# Patient Record
Sex: Female | Born: 1935
Health system: Southern US, Community
[De-identification: ages and names within clinical notes are randomized; demographics above are authoritative.]

## PROBLEM LIST (undated history)

## (undated) DIAGNOSIS — N182 Chronic kidney disease, stage 2 (mild): Secondary | ICD-10-CM

## (undated) DIAGNOSIS — I8390 Asymptomatic varicose veins of unspecified lower extremity: Secondary | ICD-10-CM

## (undated) DIAGNOSIS — H9190 Unspecified hearing loss, unspecified ear: Secondary | ICD-10-CM

## (undated) DIAGNOSIS — I10 Essential (primary) hypertension: Secondary | ICD-10-CM

## (undated) DIAGNOSIS — M419 Scoliosis, unspecified: Secondary | ICD-10-CM

## (undated) DIAGNOSIS — D509 Iron deficiency anemia, unspecified: Secondary | ICD-10-CM

## (undated) DIAGNOSIS — E7800 Pure hypercholesterolemia, unspecified: Secondary | ICD-10-CM

## (undated) HISTORY — DX: Iron deficiency anemia, unspecified: D50.9

## (undated) HISTORY — PX: TONSILLECTOMY: SUR1361

## (undated) HISTORY — DX: Scoliosis, unspecified: M41.9

## (undated) HISTORY — PX: VARICOSE VEIN SURGERY: SHX832

## (undated) HISTORY — DX: Chronic kidney disease, stage 2 (mild): N18.2

## (undated) HISTORY — PX: STAPEDECTOMY: SHX2435

---

## 1997-03-26 ENCOUNTER — Other Ambulatory Visit: Admission: RE | Admit: 1997-03-26 | Discharge: 1997-03-26 | Payer: Self-pay | Admitting: Cardiology

## 1998-01-24 ENCOUNTER — Ambulatory Visit (HOSPITAL_COMMUNITY): Admission: RE | Admit: 1998-01-24 | Discharge: 1998-01-24 | Payer: Self-pay | Admitting: Cardiology

## 1998-11-08 ENCOUNTER — Other Ambulatory Visit: Admission: RE | Admit: 1998-11-08 | Discharge: 1998-11-08 | Payer: Self-pay | Admitting: Cardiology

## 1999-01-29 ENCOUNTER — Ambulatory Visit (HOSPITAL_COMMUNITY): Admission: RE | Admit: 1999-01-29 | Discharge: 1999-01-29 | Payer: Self-pay | Admitting: Cardiology

## 1999-01-29 ENCOUNTER — Encounter: Payer: Self-pay | Admitting: Cardiology

## 1999-11-28 ENCOUNTER — Other Ambulatory Visit: Admission: RE | Admit: 1999-11-28 | Discharge: 1999-11-28 | Payer: Self-pay | Admitting: Internal Medicine

## 2000-01-24 ENCOUNTER — Encounter: Payer: Self-pay | Admitting: Emergency Medicine

## 2000-01-24 ENCOUNTER — Emergency Department (HOSPITAL_COMMUNITY): Admission: EM | Admit: 2000-01-24 | Discharge: 2000-01-24 | Payer: Self-pay | Admitting: Emergency Medicine

## 2000-02-05 ENCOUNTER — Ambulatory Visit (HOSPITAL_COMMUNITY): Admission: RE | Admit: 2000-02-05 | Discharge: 2000-02-05 | Payer: Self-pay | Admitting: Internal Medicine

## 2000-02-05 ENCOUNTER — Encounter: Payer: Self-pay | Admitting: Internal Medicine

## 2000-11-10 ENCOUNTER — Ambulatory Visit (HOSPITAL_COMMUNITY): Admission: RE | Admit: 2000-11-10 | Discharge: 2000-11-10 | Payer: Self-pay | Admitting: Gastroenterology

## 2001-02-17 ENCOUNTER — Encounter: Payer: Self-pay | Admitting: Internal Medicine

## 2001-02-17 ENCOUNTER — Ambulatory Visit (HOSPITAL_COMMUNITY): Admission: RE | Admit: 2001-02-17 | Discharge: 2001-02-17 | Payer: Self-pay | Admitting: Internal Medicine

## 2002-04-13 ENCOUNTER — Encounter: Payer: Self-pay | Admitting: Internal Medicine

## 2002-04-13 ENCOUNTER — Ambulatory Visit (HOSPITAL_COMMUNITY): Admission: RE | Admit: 2002-04-13 | Discharge: 2002-04-13 | Payer: Self-pay | Admitting: Internal Medicine

## 2003-05-03 ENCOUNTER — Ambulatory Visit (HOSPITAL_COMMUNITY): Admission: RE | Admit: 2003-05-03 | Discharge: 2003-05-03 | Payer: Self-pay | Admitting: Internal Medicine

## 2003-08-10 ENCOUNTER — Encounter: Admission: RE | Admit: 2003-08-10 | Discharge: 2003-08-10 | Payer: Self-pay | Admitting: Otolaryngology

## 2005-01-20 ENCOUNTER — Encounter: Admission: RE | Admit: 2005-01-20 | Discharge: 2005-01-20 | Payer: Self-pay | Admitting: Internal Medicine

## 2007-03-30 ENCOUNTER — Encounter: Admission: RE | Admit: 2007-03-30 | Discharge: 2007-03-30 | Payer: Self-pay | Admitting: Internal Medicine

## 2007-04-12 ENCOUNTER — Encounter: Admission: RE | Admit: 2007-04-12 | Discharge: 2007-04-12 | Payer: Self-pay | Admitting: Interventional Radiology

## 2007-04-20 ENCOUNTER — Encounter: Admission: RE | Admit: 2007-04-20 | Discharge: 2007-04-20 | Payer: Self-pay | Admitting: Interventional Radiology

## 2007-05-24 ENCOUNTER — Encounter: Admission: RE | Admit: 2007-05-24 | Discharge: 2007-05-24 | Payer: Self-pay | Admitting: Interventional Radiology

## 2007-11-15 ENCOUNTER — Encounter: Admission: RE | Admit: 2007-11-15 | Discharge: 2007-11-15 | Payer: Self-pay | Admitting: Interventional Radiology

## 2008-10-06 ENCOUNTER — Ambulatory Visit: Payer: Self-pay | Admitting: Diagnostic Radiology

## 2008-10-06 ENCOUNTER — Emergency Department (HOSPITAL_BASED_OUTPATIENT_CLINIC_OR_DEPARTMENT_OTHER): Admission: EM | Admit: 2008-10-06 | Discharge: 2008-10-06 | Payer: Self-pay | Admitting: Emergency Medicine

## 2010-06-06 NOTE — Procedures (Signed)
[place]  Patient:    Donna Callahan, Donna Callahan Visit Number: [id number] MRN: [id number]          Service Type: END Location: ENDO Attending Physician:  Rich Brave Dictated by:   Florencia Reasons, M.D. Proc. Date: 11/09/00 Admit Date:  11/10/2000   CC:         Rodrigo Ran, M.D.   Procedure Report  PROCEDURE:  Colonoscopy.  INDICATION:  This is a 77 year old female, asymptomatic, for colon cancer screening.  FINDINGS:  Normal exam to the terminal ileum except for moderate sigmoid diverticulosis.  The nature, purpose, and risks of the procedure had been discussed with the patient, who provided written consent.  Sedation was 50 mcg and Versed 5 mg IV without arrhythmias or desaturation.  The Olympus adjustable-tension pediatric video colonoscope was advanced to the terminal ileum with just some mild looping overcome by external abdominal compression.  Pullback was then performed.  The quality of the prep was excellent, and it is felt that all areas were seen well.  There was extensive sigmoid diverticulosis, but otherwise this was a normal exam without evidence of polyps, cancer, colitis, or vascular malformations. Retroflexion in the rectum was unremarkable, and pull-out through the anal canal demonstrated just mild internal hemorrhoids.  No biopsies were obtained. The patient tolerated the procedure well, and there were no apparent complications.  IMPRESSION:  Normal screening colonoscopy except for sigmoid diverticulosis.  PLAN:  Consideration for flexible sigmoidoscopy in five years for continued colon cancer screening. Dictated by:   Florencia Reasons, M.D. Attending Physician:  Rich Brave DD:  11/10/00 TD:  11/11/00 Job: [id number]

## 2010-11-25 ENCOUNTER — Other Ambulatory Visit: Payer: Self-pay | Admitting: *Deleted

## 2011-05-02 ENCOUNTER — Emergency Department (INDEPENDENT_AMBULATORY_CARE_PROVIDER_SITE_OTHER): Payer: Medicare Other

## 2011-05-02 ENCOUNTER — Encounter (HOSPITAL_COMMUNITY): Payer: Self-pay | Admitting: Emergency Medicine

## 2011-05-02 ENCOUNTER — Emergency Department (INDEPENDENT_AMBULATORY_CARE_PROVIDER_SITE_OTHER)
Admission: EM | Admit: 2011-05-02 | Discharge: 2011-05-02 | Disposition: A | Discharge status: Home / Self Care | Payer: Medicare Other | Source: Home / Self Care | Attending: Emergency Medicine | Admitting: Emergency Medicine

## 2011-05-02 DIAGNOSIS — M1120 Other chondrocalcinosis, unspecified site: Secondary | ICD-10-CM

## 2011-05-02 HISTORY — DX: Pure hypercholesterolemia, unspecified: E78.00

## 2011-05-02 HISTORY — DX: Essential (primary) hypertension: I10

## 2011-05-02 HISTORY — DX: Unspecified hearing loss, unspecified ear: H91.90

## 2011-05-02 MED ORDER — PREDNISONE 5 MG PO KIT: 1.0000 | PACK | Freq: Every day | ORAL | Status: DC

## 2011-05-02 NOTE — ED Notes (Signed)
Given warm blankets 

## 2011-05-02 NOTE — Discharge Instructions (Signed)
Pseudogout  Pseudogout is similar to gout. It is an arthritis that causes pain, swelling, and inflammation in a joint. This is due to the presence of calcium pyrophosphate crystals in the joint fluid. This is a different type of crystal than the crystals that cause gout. The joint pain can be severe and may last for days. In some cases, it may last much longer and can mimic rheumatoid arthritis or osteoarthritis.  CAUSES   The exact cause of the disease is not known. It develops when crystals of calcium pyrophosphate build up in a joint. These crystals cause inflammation that leads to pain and swelling of the joint.   Chances of developing pseudogout increase with age. It often follows a minor injury.   The condition may be passed down from parent to child (hereditary).   Events such as strokes, heart attacks, or surgery may increase the risk of pseudogout.   Pseudogout can be associated with other disorders (hemophilia, ochronosis, amyloidosis, or hormonal disorders).   Pseudogout can be associated with dehydration, especially following surgery or hospitalization. Patients with known pseudogout should stay well hydrated before and after surgery.  SYMPTOMS    Intense, constant pain in one joint that seems to come on for no reason.   The joint area may be hot to the touch, red, swollen, and stiff.   Pain may last from several days to a few weeks. It may then disappear. Later, it may start again, possibly in a different joint.   Pseudogout usually affects the knees. It can also affect the wrists, elbows, shoulders, and ankles.  DIAGNOSIS   The diagnosis is often suggested by your exam or is suspected when an abnormal buildup of calcium salts (calcifications) are seen in the cartilage of joints on X-rays. The final diagnosis is made when fluid from the joint is examined under a special microscope used to find calcium pyrophosphate crystals. The crystals of pseudogout and gout may both be present at the same  time.  TREATMENT   Nonsteroidal anti-inflammatory drugs (NSAIDs), such as naproxen, treat the pain. Identifying the trigger of pseudogout and treating the underlying cause, such as dehydration, is also important. There is no way to remove the crystals themselves.  HOME CARE INSTRUCTIONS    Put ice on the sore joint.   Put ice in a plastic bag.   Place a towel between your skin and the bag.   Leave the ice on for 15 to 20 minutes at a time, 3 to 4 times a day, for the first 2 days.   Keep your affected joints raised (elevated) when possible to lessen swelling.   Use crutches (non-weight bearing) as needed. Walk without crutches as the pain allows, or as instructed. Gradually, start bearing weight.   Only take over-the-counter or prescription medicines for pain, discomfort, or fever as directed by your caregiver.   Once you recover from the painful attack, exercise regularly to keep your muscle strength. Not using a sore joint will cause the muscles around it to become weak. This may increase pain. Low-impact exercises, such as swimming, bicycling, water aerobics, and walking, may be best. This will give you energy, strengthen your heart, help you control your weight, and improve your well-being.   Maintain a healthy weight so your joints do not need to bear more weight than necessary.  SEEK MEDICAL CARE IF:    You have an increase in joint pain not relieved with medicine.   You have a fever.   You   have more serious symptoms such as skin rash, diarrhea, vomiting, headache, or other joint pains.  FOR MORE INFORMATION   Arthritis Foundation: www.arthritis.org  National Institute of Arthritis and Musculoskeletal and Skin Diseases: www.niams.nih.gov  Document Released: 09/28/2003 Document Revised: 12/25/2010 Document Reviewed: 04/19/2009  ExitCare Patient Information 2012 [employer].

## 2011-05-02 NOTE — ED Notes (Addendum)
Patient reports injury 2 weeks ago.  Patient reports tripping over clothing rack, patient reports she was looking up at signage for a particular department.  Patient reports pain gradually worsening, patient reports she has not seen a physician prior to now. Pain in left knee with weight bearing.  Cannot recreate pain with plapation.

## 2011-05-02 NOTE — ED Provider Notes (Signed)
Chief Complaint  Patient presents with  . Knee Pain    History of Present Illness:   Donna Callahan is a 78 year old female with high blood pressure. She fell and injured her knee 2 weeks ago. Ever since then it's been aching particularly over the medial joint line and somewhat swollen. It hurts to bear weight and feels better if she gets off her feet. She denies any giving way, locking, popping, or cracking of the knee.  Review of Systems:  Other than noted above, the patient denies any of the following symptoms: Systemic:  No fevers, chills, sweats, or aches.  No fatigue or tiredness. Musculoskeletal:  No joint pain, arthritis, bursitis, swelling, back pain, or neck pain. Neurological:  No muscular weakness, paresthesias, headache, or trouble with speech or coordination.  No dizziness.   PMFSH:  Past medical history, family history, social history, meds, and allergies were reviewed.  Physical Exam:   Vital signs:  BP 169/88  Pulse 62  Temp(Src) 98.4 F (36.9 C) (Oral)  Resp 16  SpO2 98% Gen:  Alert and oriented times 3.  In no distress. Musculoskeletal: There is no obvious swelling or joint effusion. There is no pain to palpation over the knee. The knee has a full range of motion with some pain on full flexion and slight crepitus. McMurray's sign is negative. Lachman's sign is negative. Anterior drawer sign is negative. Otherwise, all joints had a full a ROM with no swelling, bruising or deformity.  No edema, pulses full. Extremities were warm and pink.  Capillary refill was brisk.  Skin:  Clear, warm and dry.  No rash. Neuro:  Alert and oriented times 3.  Muscle strength was normal.  Sensation was intact to light touch.   Radiology:  Dg Knee Complete 4 Views Left  05/02/2011  *RADIOLOGY REPORT*  Clinical Data: Larey Seat 2 weeks ago with persistent left knee pain.  LEFT KNEE - COMPLETE 4+ VIEW  Comparison: None.  Findings: The left knee is located.  Chondrocalcinosis is present in both medial and  lateral menisci.  Mild degenerative changes are noted in the medial and patellofemoral compartments.  There is no significant effusion.  No acute osseous abnormality is evident.  IMPRESSION:  1.  No acute abnormality. 2.  Minimal degenerative change. 3.  Chondrocalcinosis of the menisci.  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  Original Report Authenticated By: Jamesetta Orleans. MATTERN, M.D.    Assessment:  The encounter diagnosis was Pseudo-gout. I am reluctant to treat with nonsteroidal anti-inflammatory drugs in view of her high blood pressure, age, and medications that she is currently taking. I think the safest thing to take would be prednisone. I have suggested that she followup with her primary care doctor with regard to her blood pressure in about a week.  Plan:   1.  The following meds were prescribed:   New Prescriptions   PREDNISONE 5 MG KIT    Take 1 kit (5 mg total) by mouth daily after breakfast. Prednisone 5 mg 6 day dosepack.  Take as directed.   2.  The patient was instructed in symptomatic care, including rest and activity, elevation, application of ice and compression.  Appropriate handouts were given. 3.  The patient was told to return if becoming worse in any way, if no better in 3 or 4 days, and given some red flag symptoms that would indicate earlier return.   4.  The patient was told to follow up with her primary care doctor in one week.  Reuben Likes, MD 05/02/11 [phone]

## 2011-05-02 NOTE — ED Notes (Signed)
Provided with coffee

## 2011-10-08 ENCOUNTER — Telehealth: Payer: Self-pay

## 2011-11-23 NOTE — Telephone Encounter (Signed)
Note to close encounter only. Donna Callahan, [name]  

## 2013-06-14 ENCOUNTER — Other Ambulatory Visit: Payer: Self-pay | Admitting: Internal Medicine

## 2013-06-14 DIAGNOSIS — I83819 Varicose veins of unspecified lower extremities with pain: Secondary | ICD-10-CM

## 2013-06-21 ENCOUNTER — Ambulatory Visit
Admission: RE | Admit: 2013-06-21 | Discharge: 2013-06-21 | Disposition: A | Discharge status: Home / Self Care | Payer: Medicare Other | Source: Ambulatory Visit | Attending: Internal Medicine | Admitting: Internal Medicine

## 2013-06-21 ENCOUNTER — Encounter (INDEPENDENT_AMBULATORY_CARE_PROVIDER_SITE_OTHER): Payer: Self-pay

## 2013-06-21 DIAGNOSIS — I83819 Varicose veins of unspecified lower extremities with pain: Secondary | ICD-10-CM

## 2013-07-06 ENCOUNTER — Telehealth: Payer: Self-pay | Admitting: Radiology

## 2013-07-06 NOTE — Telephone Encounter (Signed)
Phoned patient to review insurance pre authorization for EVLT of GSV of RLE.     Patient prefers to delay procedure until late September 2015.  Will contact patient in August 2015 to schedule procedure.  Henri Carmell AustriaGales, RN 07/06/2013 1:36 PM

## 2013-09-20 ENCOUNTER — Other Ambulatory Visit (HOSPITAL_COMMUNITY): Payer: Self-pay | Admitting: Interventional Radiology

## 2013-09-20 DIAGNOSIS — I83893 Varicose veins of bilateral lower extremities with other complications: Secondary | ICD-10-CM

## 2013-10-26 ENCOUNTER — Ambulatory Visit
Admission: RE | Admit: 2013-10-26 | Discharge: 2013-10-26 | Disposition: A | Discharge status: Home / Self Care | Payer: Medicare Other | Source: Ambulatory Visit | Attending: Interventional Radiology | Admitting: Interventional Radiology

## 2013-10-26 ENCOUNTER — Other Ambulatory Visit (HOSPITAL_COMMUNITY): Payer: Self-pay | Admitting: Interventional Radiology

## 2013-10-26 VITALS — BP 165/92 | HR 83 | Temp 97.7°F | Resp 14 | Ht 64.0 in | Wt 125.0 lb

## 2013-10-26 DIAGNOSIS — I8393 Asymptomatic varicose veins of bilateral lower extremities: Secondary | ICD-10-CM

## 2013-10-26 HISTORY — DX: Asymptomatic varicose veins of unspecified lower extremity: I83.90

## 2013-10-26 NOTE — Progress Notes (Signed)
0945  Informed consent obtained.  Patient given verbal and written discharge instructions.  States that she understands.  0950  22 gauge x 1" insyte catheter started IV Left antecubital (x 1 attempt) with NSL & 7" extension.  Flushed with 10 mL NSS without difficulty.  Stie unremarkable.  1015  Procedure started.    1120  Procedure completed.  Patient tolerated well.  1125  IV discontinued, catheter intact. Site unremarkable.    1130 Wearing thigh high graduated compression garment on RLE  (20-30 mm Hg).  Ambulated x 10 minutes without assistance.  Gait steady.  1140  Reviewed discharge instructions.  Patient's daughter here to provide transportation to home.    1145  Discharged to home.   [place] Delauder Carmell AustriaGales, RN 10/26/2013 11:39 AM

## 2013-11-01 ENCOUNTER — Ambulatory Visit
Admission: RE | Admit: 2013-11-01 | Discharge: 2013-11-01 | Disposition: A | Discharge status: Home / Self Care | Payer: Medicare Other | Source: Ambulatory Visit | Attending: Interventional Radiology | Admitting: Interventional Radiology

## 2013-11-01 DIAGNOSIS — I8393 Asymptomatic varicose veins of bilateral lower extremities: Secondary | ICD-10-CM

## 2013-11-01 NOTE — Consult Note (Signed)
Chief Complaint: Right lower extremity venous insufficiency.  Referring Physician(s): Shick,Michael  History of Present Illness: Donna Callahan is a 80 y.o. female with right lower extremity venous insufficiency. The patient underwent endovascular laser treatment to the great saphenous vein on 10/26/2013. She did very well following the procedure and she has been wearing the stockings continuously. She had mild pain at the puncture site for 2 days but that pain has resolved. She has noticed some bruising in her medial right upper thigh. The patient did fall within the last week and had some swelling in her right knee. Patient denies fevers or chills.  Past Medical History  Diagnosis Date  . Hypertension   . High cholesterol   . Hard of hearing   . Varicose veins     Past Surgical History  Procedure Laterality Date  . Tonsillectomy    . Stapedectomy      Allergies: Penicillins and Sulfa antibiotics  Medications: Prior to Admission medications   Medication Sig Start Date End Date Taking? Authorizing Provider  aspirin 81 MG tablet Take 81 mg by mouth daily.    Historical Provider, MD  buPROPion (WELLBUTRIN) 75 MG tablet Take 75 mg by mouth daily.    Historical Provider, MD  diltiazem (TIAZAC) 240 MG 24 hr capsule Take 240 mg by mouth daily.    Historical Provider, MD  LISINOPRIL PO Take by mouth.    Historical Provider, MD  loperamide (IMODIUM) 2 MG capsule Take by mouth as needed for diarrhea or loose stools.    Historical Provider, MD  potassium chloride (KLOR-CON) 20 MEQ packet Take 20 mEq by mouth 2 (two) times daily.    Historical Provider, MD  PredniSONE 5 MG KIT Take 1 kit (5 mg total) by mouth daily after breakfast. Prednisone 5 mg 6 day dosepack.  Take as directed. 05/02/11   Harden Mo, MD  psyllium (METAMUCIL) 58.6 % powder Take 1 packet by mouth daily.    Historical Provider, MD  SIMVASTATIN PO Take by mouth.    Historical Provider, MD  TRIAMTERENE-HCTZ PO Take by  mouth.    Historical Provider, MD    No family history on file.  History   Social History  . Marital Status: Married    Spouse Name: [name]    Number of Children: [name]  . Years of Education: [name]   Social History Main Topics  . Smoking status: Never Smoker   . Smokeless tobacco: Not on file  . Alcohol Use: No  . Drug Use: No  . Sexual Activity: Not on file   Other Topics Concern  . Not on file   Social History Narrative  . No narrative on file     Review of Systems  Constitutional: Negative.     Vital Signs: There were no vitals taken for this visit.  Physical Exam Right lower extremity: There is bruising in the medial right upper thigh. There is obvious swelling of the right knee. The puncture site above the right ankle is well-healed without bruising or surrounding redness. There are visible varicosities along the anterior and lateral aspect of the lower right calf.  Imaging: US Venous Img Lower Unilateral Right  11/01/2013   CLINICAL DATA:  80 year old with right lower extremity venous insufficiency. The patient underwent endovascular laser treatment to the right great saphenous vein on 10/26/2013. Patient presents for 1 week follow-up.  EXAM: RIGHT LOWER EXTREMITY VENOUS DOPPLER ULTRASOUND  TECHNIQUE: Gray-scale sonography with graded compression, as well as color Doppler and  duplex ultrasound, were performed to evaluate the deep venous system from the level of the common femoral vein through the popliteal and proximal calf veins. Spectral Doppler was utilized to evaluate flow at rest and with distal augmentation maneuvers.  COMPARISON:  None.  FINDINGS: Superficial venous system: The treated right great saphenous vein is occluded from the lower calf to the right groin. Varicosities in the anterior upper thigh are also thrombosed. There are patent varicosities along the anterior knee and along the anterior and lateral lower calf. The great saphenous vein is occluded to the  saphenofemoral junction.  Deep venous system: Normal compressibility, augmentation and color Doppler flow in the right common femoral vein, right femoral vein and right popliteal vein. Visualized deep calf veins are patent.  Other: There is fluid around the patient's right knee and the right knee is swollen on physical examination. Reportedly, the patient had a recent fall and injury to the right knee.  IMPRESSION: The treated right great saphenous vein is occluded.  The right lower extremity deep venous system is patent. No evidence for deep vein thrombosis.  Right knee effusion. Patient did have a large knee effusion on a MRI from 05/07/2013 and unclear if this fluid is acute or chronic.   Electronically Signed   By: Markus Daft M.D.   On: 11/01/2013 10:20   Ir Embo Venous Not [place]  10/26/2013   CLINICAL DATA:  Known bilateral GSV venous insufficiency, lower extremity varicose veins. Progressive right lower extremity venous insufficiency with enlarging right thigh, knee and calf varicosities, edema, leg pain and fatigue.  EXAM: ULTRASOUND GUIDED RIGHT GSV TRANSCATHETER LASER OCCLUSION  Date:  10/8/201510/08/2013 11:49 am  Radiologist:  M. Daryll Brod, MD  Guidance:  Ultrasound  FLUOROSCOPY TIME:  None.  MEDICATIONS AND MEDICAL HISTORY: 300 cc 1% diluted Lidocaine for tumescent anesthesia  ANESTHESIA/SEDATION: None.  CONTRAST:  None.  COMPLICATIONS: None immediate  PROCEDURE: Informed consent was obtained from the patient following explanation of the procedure, risks, benefits and alternatives. The patient understands, agrees and consents for the procedure. All questions were addressed. A time out was performed.  Maximal barrier sterile technique utilized including caps, mask, sterile gowns, sterile gloves, large sterile drape, hand hygiene, and Betadine.  The preliminary venous mapping performed of the right GSV from the saphenous femoral junction to the ankle. Overlying skin was  marked. Right leg was sterilely prepped and draped. Under sterile conditions and local anesthesia, ultrasound micropuncture access performed of the inferior right GSV above the ankle. Guidewire advanced easily followed by the 4 [place] dilator. 035 guidewire inserted and advanced across the left SFJ without difficulty. 4 French delivery sheath advanced and position 20 mm below the left SFJ. Position confirmed with ultrasound. Images obtained for documentation. Laser fiber connected to the energy source and advanced through the sheath but not exposed to the vein wall. Local and tumescent anesthesia applied with the tumescent pump delivery set utilizing 300 cc diluted 1% lidocaine. Survey of the right GSV segment to be treated confirmed adequate insulation, collapse and tumescence around the vein. Vein depth is greater than 10 mm.  Laser fiber was enable at power setting of 8 W. Laser fiber position recovered to be 20 mm inferior to the right SFJ. The treated segment was 60 cm in length. 365 seconds of laser time was utilized to treat the right GSV with a total laser energy of 2922 Joules.  Hemostasis obtained with compression. Patient tolerated the procedure well. No  immediate complication. She was ambulated in our [place] in a 20/30 mm Hg compression stockings and stable for discharge after 30 min.  IMPRESSION: Successful ultrasound right GSV transcatheter laser occlusion.  PLAN: Follow-up will be performed at 1 week, 1 month and 6 months. She will likely need additional right lower extremity varicose vein sclerotherapy versus vein removal.   Electronically Signed   By: Daryll Brod M.D.   On: 10/26/2013 12:07    Assessment and Plan:  80 year old with right lower extremity venous insufficiency and underwent endovascular laser treatment to the right great saphenous vein. The treated right great saphenous vein is occluded and no evidence for right lower extremity DVT. There are residual patent varicosities  in the anterior knee and right calf. These will likely require ultrasound-guided sclerotherapy.  Patient tolerated the procedure very well and continues to wear her stockings. I advised the patient to continue to wear the stocking until her next visit with Dr. Annamaria Boots in 3-4 weeks.  Right knee effusion. Patient had a knee effusion on a previous MRI study and unclear if this represents acute or chronic fluid. Patient is scheduled to see her primary care physician.     I spent a total of 10 minutes face to face in clinical consultation, greater than 50% of which was counseling/coordinating care for the right lower extremity venous insufficiency.  Signed: Carylon Perches 11/01/2013, 12:10 PM

## 2013-11-30 ENCOUNTER — Ambulatory Visit
Admission: RE | Admit: 2013-11-30 | Discharge: 2013-11-30 | Disposition: A | Discharge status: Home / Self Care | Payer: Medicare Other | Source: Ambulatory Visit | Attending: Interventional Radiology | Admitting: Interventional Radiology

## 2013-11-30 DIAGNOSIS — I8393 Asymptomatic varicose veins of bilateral lower extremities: Secondary | ICD-10-CM

## 2013-11-30 NOTE — Progress Notes (Signed)
Patient ID: Donna Callahan, female   DOB: 26-Nov-1930, 80 y.o.   MRN: [id number]   Chief Complaint: One month status post right GSV transcatheter laser occlusion for venous insufficiency, varicose veins or leg pain.  Referring Physician(s): Perini  History of Present Illness: Donna Callahan is a 80 y.o. female who is now one month status post right GSP transcatheter laser occlusion for venous visit, varicose veins and leg pain. Over the last month she has recovered very well. She reports decrease in size of the right lower surety varicosities across the knee. Right leg is now asymptomatic. No current right leg pain, fatigue, or heaviness. She has been compliant with the 20/30 mm mercury compression stocking and daily walking. Overall she is doing very well. No interval fevers. No significant extremity edema or thrombophlebitis. She has had a recent fall with chronic right knee pain and effusion.  Past Medical History  Diagnosis Date  . Hypertension   . High cholesterol   . Hard of hearing   . Varicose veins     Past Surgical History  Procedure Laterality Date  . Tonsillectomy    . Stapedectomy      Allergies: Penicillins and Sulfa antibiotics  Medications: Prior to Admission medications   Medication Sig Start Date End Date Taking? Authorizing Provider  aspirin 81 MG tablet Take 81 mg by mouth daily.    Historical Provider, MD  buPROPion (WELLBUTRIN) 75 MG tablet Take 75 mg by mouth daily.    Historical Provider, MD  diltiazem (TIAZAC) 240 MG 24 hr capsule Take 240 mg by mouth daily.    Historical Provider, MD  LISINOPRIL PO Take by mouth.    Historical Provider, MD  loperamide (IMODIUM) 2 MG capsule Take by mouth as needed for diarrhea or loose stools.    Historical Provider, MD  potassium chloride (KLOR-CON) 20 MEQ packet Take 20 mEq by mouth 2 (two) times daily.    Historical Provider, MD  PredniSONE 5 MG KIT Take 1 kit (5 mg total) by mouth daily after breakfast. Prednisone 5 mg 6  day dosepack.  Take as directed. 05/02/11   Harden Mo, MD  psyllium (METAMUCIL) 58.6 % powder Take 1 packet by mouth daily.    Historical Provider, MD  SIMVASTATIN PO Take by mouth.    Historical Provider, MD  TRIAMTERENE-HCTZ PO Take by mouth.    Historical Provider, MD    No family history on file.  History   Social History  . Marital Status: Married    Spouse Name: [name]    Number of Children: [name]  . Years of Education: [name]   Social History Main Topics  . Smoking status: Never Smoker   . Smokeless tobacco: Not on file  . Alcohol Use: No  . Drug Use: No  . Sexual Activity: Not on file   Other Topics Concern  . Not on file   Social History Narrative  . No narrative on file    Review of Systems: A 12 point ROS discussed and pertinent positives are indicated in the HPI above.  All other systems are negative.  Review of Systems  Constitutional: Negative for fever, activity change and fatigue.  All other systems reviewed and are negative.   Vital Signs: There were no vitals taken for this visit.  Physical Exam  Constitutional: She appears well-developed and well-nourished. No distress.  Musculoskeletal: She exhibits no edema or tenderness.  Right lower extremity demonstrates decrease in size of the dominant varicosity across the knee. No  signs of active cellulitis or thrombophlebitis. No significant peripheral edema. Entry Site in the calf region is well-healed.  Skin: She is not diaphoretic.    Imaging: US Venous Img Lower Unilateral Right  11/30/2013   CLINICAL DATA:  One month status post right GSV transcatheter laser occlusion for venous insufficiency, leg pain and varicose veins  EXAM: RIGHT LOWER EXTREMITY VENOUS DOPPLER ULTRASOUND  TECHNIQUE: Gray-scale sonography with graded compression, as well as color Doppler and duplex ultrasound were performed to evaluate the lower extremity deep venous systems from the level of the common femoral vein and including the  common femoral, femoral, profunda femoral, popliteal and calf veins including the posterior tibial, peroneal and gastrocnemius veins when visible. The superficial great saphenous vein was also interrogated. Spectral Doppler was utilized to evaluate flow at rest and with distal augmentation maneuvers in the common femoral, femoral and popliteal veins.  COMPARISON:  11/01/2013  FINDINGS: The right common femoral, femoral, and popliteal veins demonstrate normal compressibility, phasicity and augmentation without DVT.  The right GSV treated segment from the saphenous femoral junction into the proximal calf is occluded. No recanalization. Right lower extremity varicosities are decompressed and smaller but remain patent.  Chronic right knee effusion again demonstrated.  IMPRESSION: Right GSV treated segment remains occluded at 1 month.  Negative for DVT  Chronic moderate right knee effusion   Electronically Signed   By: Daryll Brod M.D.   On: 11/30/2013 10:23   US Venous Img Lower Unilateral Right  11/01/2013   CLINICAL DATA:  80 year old with right lower extremity venous insufficiency. The patient underwent endovascular laser treatment to the right great saphenous vein on 10/26/2013. Patient presents for 1 week follow-up.  EXAM: RIGHT LOWER EXTREMITY VENOUS DOPPLER ULTRASOUND  TECHNIQUE: Gray-scale sonography with graded compression, as well as color Doppler and duplex ultrasound, were performed to evaluate the deep venous system from the level of the common femoral vein through the popliteal and proximal calf veins. Spectral Doppler was utilized to evaluate flow at rest and with distal augmentation maneuvers.  COMPARISON:  None.  FINDINGS: Superficial venous system: The treated right great saphenous vein is occluded from the lower calf to the right groin. Varicosities in the anterior upper thigh are also thrombosed. There are patent varicosities along the anterior knee and along the anterior and lateral lower  calf. The great saphenous vein is occluded to the saphenofemoral junction.  Deep venous system: Normal compressibility, augmentation and color Doppler flow in the right common femoral vein, right femoral vein and right popliteal vein. Visualized deep calf veins are patent.  Other: There is fluid around the patient's right knee and the right knee is swollen on physical examination. Reportedly, the patient had a recent fall and injury to the right knee.  IMPRESSION: The treated right great saphenous vein is occluded.  The right lower extremity deep venous system is patent. No evidence for deep vein thrombosis.  Right knee effusion. Patient did have a large knee effusion on a MRI from 05/07/2013 and unclear if this fluid is acute or chronic.   Electronically Signed   By: Markus Daft M.D.   On: 11/01/2013 10:20    Assessment and Plan:  One month status post right GSV transcatheter laser occlusion for venous insufficiency, varicose veins and leg pain. Treated segment is occluded at one month. Right lower extremity symptoms have resolved. No current right leg pain, fatigue, heaviness, or difficulty ambulating.  Continue daily compression stockings bilaterally, daily walking, and followup in 6 months.  I spent a total of  20 minutes face to face in clinical consultation, greater than 50% of which was counseling/coordinating care for the patient.  SignedGreggory Keen T. 11/30/2013, 10:33 AM

## 2014-03-30 ENCOUNTER — Other Ambulatory Visit (HOSPITAL_COMMUNITY): Payer: Self-pay | Admitting: Interventional Radiology

## 2014-03-30 DIAGNOSIS — I83811 Varicose veins of right lower extremities with pain: Secondary | ICD-10-CM

## 2014-04-10 ENCOUNTER — Ambulatory Visit: Payer: Self-pay | Admitting: Orthopedic Surgery

## 2014-04-10 NOTE — Progress Notes (Signed)
Preoperative surgical orders have been place into the Epic hospital system for Donna Callahan on 04/10/2014, 12:07 PM  by Patrica DuelPERKINS, Shiya Fogelman for surgery on 05-02-2014.  Preop Total Knee orders including Experal, IV Tylenol, and IV Decadron as long as there are no contraindications to the above medications. Donna Peacerew John Vasconcelos, PA-C

## 2014-04-23 ENCOUNTER — Other Ambulatory Visit (HOSPITAL_COMMUNITY): Payer: Self-pay | Admitting: *Deleted

## 2014-04-23 NOTE — Patient Instructions (Addendum)
Donna Callahan  04/23/2014   Your procedure is scheduled on: Wednesday 05/02/2014  Report to [facility] Main  Entrance and follow signs to               Short Stay Center at 1200 PM.  Call this number if you have problems the morning of surgery [phone]   Remember:  Do not eat food  :After Midnight.  MAY HAVE CLEAR LIQUIDS FROM MIDNIGHT UP UNTIL 0800 AM THEN NOTHING UNTIL AFTER SURGERY!    Take these medicines the morning of surgery with A SIP OF WATER: Diltiazem                    CLEAR LIQUID DIET  Foods Allowed                                                                     Foods Excluded  Coffee and tea, regular and decaf                             liquids that you cannot  Plain Jell-O in any flavor                                             see through such as: Fruit ices (not with fruit pulp)                                     milk, soups, orange juice  Iced Popsicles                                    All solid food Carbonated beverages, regular and diet                                    Cranberry, grape and apple juices Sports drinks like Gatorade Lightly seasoned clear broth or consume(fat free) Sugar, honey syrup   Sample Menu:  Breakfast                                Lunch                                     Supper Cranberry juice                    Beef broth                            Chicken broth Jell-O  Grape juice                           Apple juice Coffee or tea                        Jell-O                                      Popsicle                                                Coffee or tea                        Coffee or tea  _____________________________________________________________________                 Donna QuinYou may not have any metal on your body including hair pins and              piercings  Do not wear jewelry, make-up, lotions, powders or perfumes.             Do not  wear nail polish.  Do not shave  48 hours prior to surgery.              Men may shave face and neck.   Do not bring valuables to the hospital. [facility] IS NOT             RESPONSIBLE   FOR VALUABLES.  Contacts, dentures or bridgework may not be worn into surgery.  Leave suitcase in the car. After surgery it may be brought to your room.     Patients discharged the day of surgery will not be allowed to drive home.  Name and phone number of your driver:   Special Instructions: [name]              Please read over the following fact sheets you were given: _____________________________________________________________________             [facility] Before surgery, you can play an important role.  Because skin is not sterile, your skin needs to be as free of germs as possible.  You can reduce the number of germs on your skin by washing with CHG (chlorahexidine gluconate) soap before surgery.  CHG is an antiseptic cleaner which kills germs and bonds with the skin to continue killing germs even after washing. Please DO NOT use if you have an allergy to CHG or antibacterial soaps.  If your skin becomes reddened/irritated stop using the CHG and inform your nurse when you arrive at Short Stay. Do not shave (including legs and underarms) for at least 48 hours prior to the first CHG shower.  You may shave your face/neck. Please follow these instructions carefully:  1.  Shower with CHG Soap the night before surgery and the  morning of Surgery.  2.  If you choose to wash your hair, wash your hair first as usual with your  normal  shampoo.  3.  After you shampoo, rinse your hair and body thoroughly to remove the  shampoo.  4.  Use CHG as you would any other liquid soap.  You can apply chg directly  to the skin and wash                       Gently with a scrungie or clean washcloth.  5.  Apply the CHG Soap to your body ONLY FROM THE NECK DOWN.   Do not  use on face/ open                           Wound or open sores. Avoid contact with eyes, ears mouth and genitals (private parts).                       Wash face,  Genitals (private parts) with your normal soap.             6.  Wash thoroughly, paying special attention to the area where your surgery  will be performed.  7.  Thoroughly rinse your body with warm water from the neck down.  8.  DO NOT shower/wash with your normal soap after using and rinsing off  the CHG Soap.                9.  Pat yourself dry with a clean towel.            10.  Wear clean pajamas.            11.  Place clean sheets on your bed the night of your first shower and do not  sleep with pets. Day of Surgery : Do not apply any lotions/deodorants the morning of surgery.  Please wear clean clothes to the hospital/surgery center.  FAILURE TO FOLLOW THESE INSTRUCTIONS MAY RESULT IN THE CANCELLATION OF YOUR SURGERY PATIENT SIGNATURE_________________________________  NURSE SIGNATURE__________________________________  ________________________________________________________________________   [name]  An incentive spirometer is a tool that can help keep your lungs clear and active. This tool measures how well you are filling your lungs with each breath. Taking long deep breaths may help reverse or decrease the chance of developing breathing (pulmonary) problems (especially infection) following:  A long period of time when you are unable to move or be active. BEFORE THE PROCEDURE   If the spirometer includes an indicator to show your best effort, your nurse or respiratory therapist will set it to a desired goal.  If possible, sit up straight or lean slightly forward. Try not to slouch.  Hold the incentive spirometer in an upright position. INSTRUCTIONS FOR USE   Sit on the edge of your bed if possible, or sit up as far as you can in bed or on a chair.  Hold the incentive spirometer in an upright  position.  Breathe out normally.  Place the mouthpiece in your mouth and seal your lips tightly around it.  Breathe in slowly and as deeply as possible, raising the piston or the ball toward the top of the column.  Hold your breath for 3-5 seconds or for as long as possible. Allow the piston or ball to fall to the bottom of the column.  Remove the mouthpiece from your mouth and breathe out normally.  Rest for a few seconds and repeat Steps 1 through 7 at least 10 times every 1-2 hours when you are awake. Take your time and take a few normal breaths between deep breaths.  The spirometer may include an indicator to  show your best effort. Use the indicator as a goal to work toward during each repetition.  After each set of 10 deep breaths, practice coughing to be sure your lungs are clear. If you have an incision (the cut made at the time of surgery), support your incision when coughing by placing a pillow or rolled up towels firmly against it. Once you are able to get out of bed, walk around indoors and cough well. You may stop using the incentive spirometer when instructed by your caregiver.  RISKS AND COMPLICATIONS  Take your time so you do not get dizzy or light-headed.  If you are in pain, you may need to take or ask for pain medication before doing incentive spirometry. It is harder to take a deep breath if you are having pain. AFTER USE  Rest and breathe slowly and easily.  It can be helpful to keep track of a log of your progress. Your caregiver can provide you with a simple table to help with this. If you are using the spirometer at home, follow these instructions: [place] IF:   You are having difficultly using the spirometer.  You have trouble using the spirometer as often as instructed.  Your pain medication is not giving enough relief while using the spirometer.  You develop fever of 100.5 F (38.1 C) or higher. SEEK IMMEDIATE MEDICAL CARE IF:   You cough  up bloody sputum that had not been present before.  You develop fever of 102 F (38.9 C) or greater.  You develop worsening pain at or near the incision site. MAKE SURE YOU:   Understand these instructions.  Will watch your condition.  Will get help right away if you are not doing well or get worse. Document Released: 05/18/2006 Document Revised: 03/30/2011 Document Reviewed: 07/19/2006 Exit[employer] Patient Information 2014 [employer], [place].   ________________________________________________________________________  WHAT IS A BLOOD TRANSFUSION? Blood Transfusion Information  A transfusion is the replacement of blood or some of its parts. Blood is made up of multiple cells which provide different functions.  Red blood cells carry oxygen and are used for blood loss replacement.  White blood cells fight against infection.  Platelets control bleeding.  Plasma helps clot blood.  Other blood products are available for specialized needs, such as hemophilia or other clotting disorders. BEFORE THE TRANSFUSION  Who gives blood for transfusions?   Healthy volunteers who are fully evaluated to make sure their blood is safe. This is blood bank blood. Transfusion therapy is the safest it has ever been in the practice of medicine. Before blood is taken from a donor, a complete history is taken to make sure that person has no history of diseases nor engages in risky social behavior (examples are intravenous drug use or sexual activity with multiple partners). The donor's travel history is screened to minimize risk of transmitting infections, such as malaria. The donated blood is tested for signs of infectious diseases, such as HIV and hepatitis. The blood is then tested to be sure it is compatible with you in order to minimize the chance of a transfusion reaction. If you or a relative donates blood, this is often done in anticipation of surgery and is not appropriate for emergency situations. It takes  many days to process the donated blood. RISKS AND COMPLICATIONS Although transfusion therapy is very safe and saves many lives, the main dangers of transfusion include:   Getting an infectious disease.  Developing a transfusion reaction. This is an allergic reaction  to something in the blood you were given. Every precaution is taken to prevent this. The decision to have a blood transfusion has been considered carefully by your caregiver before blood is given. Blood is not given unless the benefits outweigh the risks. AFTER THE TRANSFUSION  Right after receiving a blood transfusion, you will usually feel much better and more energetic. This is especially true if your red blood cells have gotten low (anemic). The transfusion raises the level of the red blood cells which carry oxygen, and this usually causes an energy increase.  The nurse administering the transfusion will monitor you carefully for complications. HOME CARE INSTRUCTIONS  No special instructions are needed after a transfusion. You may find your energy is better. Speak with your caregiver about any limitations on activity for underlying diseases you may have. SEEK MEDICAL CARE IF:   Your condition is not improving after your transfusion.  You develop redness or irritation at the intravenous (IV) site. SEEK IMMEDIATE MEDICAL CARE IF:  Any of the following symptoms occur over the next 12 hours:  Shaking chills.  You have a temperature by mouth above 102 F (38.9 C), not controlled by medicine.  Chest, back, or muscle pain.  People around you feel you are not acting correctly or are confused.  Shortness of breath or difficulty breathing.  Dizziness and fainting.  You get a rash or develop hives.  You have a decrease in urine output.  Your urine turns a dark color or changes to pink, red, or [place]. Any of the following symptoms occur over the next 10 days:  You have a temperature by mouth above 102 F (38.9 C), not  controlled by medicine.  Shortness of breath.  Weakness after normal activity.  The white part of the eye turns yellow (jaundice).  You have a decrease in the amount of urine or are urinating less often.  Your urine turns a dark color or changes to pink, red, or [place]four. Document Released: 01/03/2000 Document Revised: 03/30/2011 Document Reviewed: 08/22/2007 [facility] Patient Information 2014 [place], [place].  _______________________________________________________________________

## 2014-04-23 NOTE — Progress Notes (Signed)
01/25/2014-PREOPERATIVE CLEARANCE FROM DR.PERINI ON CHART.

## 2014-04-24 NOTE — H&P (Signed)
TOTAL KNEE ADMISSION H&P  Patient is being admitted for right total knee arthroplasty.  Subjective:  Chief Complaint:right knee pain.  HPI: Donna Callahan, 81 y.o. female, has a history of pain and functional disability in the right knee due to arthritis and has failed non-surgical conservative treatments for greater than 12 weeks to includeNSAID's and/or analgesics, corticosteriod injections, viscosupplementation injections and activity modification.  Onset of symptoms was gradual, starting 5 years ago with gradually worsening course since that time. The patient noted no past surgery on the right knee(s).  Patient currently rates pain in the right knee(s) at 7 out of 10 with activity. Patient has night pain, worsening of pain with activity and weight bearing, pain that interferes with activities of daily living, pain with passive range of motion, crepitus and joint swelling.  Patient has evidence of subchondral sclerosis, periarticular osteophytes and joint space narrowing by imaging studies.  There is no active infection.   Past Medical History  Diagnosis Date  . Hypertension   . High cholesterol   . Hard of hearing   . Varicose veins     Past Surgical History  Procedure Laterality Date  . Tonsillectomy    . Stapedectomy       Current outpatient prescriptions:  .  acetaminophen (TYLENOL) 500 MG tablet, Take 500-1,000 mg by mouth every morning., Disp: , Rfl:  .  aspirin 81 MG tablet, Take 81 mg by mouth daily at 12 noon. , Disp: , Rfl:  .  CALCIUM-VITAMIN D PO, Take 1 tablet by mouth 3 (three) times daily., Disp: , Rfl:  .  diltiazem (TIAZAC) 240 MG 24 hr capsule, Take 240 mg by mouth every morning. , Disp: , Rfl:  .  irbesartan (AVAPRO) 150 MG tablet, Take 150 mg by mouth at bedtime., Disp: , Rfl:  .  loperamide (IMODIUM) 2 MG capsule, Take by mouth as needed for diarrhea or loose stools., Disp: , Rfl:  .  Multiple Vitamin (MULTIVITAMIN WITH MINERALS) TABS tablet, Take 1 tablet by  mouth every morning., Disp: , Rfl:  .  Omega-3 Fatty Acids (FISH OIL PO), Take 1 capsule by mouth daily at 12 noon., Disp: , Rfl:  .  Polyethyl Glycol-Propyl Glycol (SYSTANE OP), Apply 1 drop to eye every morning., Disp: , Rfl:  .  potassium chloride (KLOR-CON) 20 MEQ packet, Take 20 mEq by mouth 3 (three) times daily. , Disp: , Rfl:  .  psyllium (METAMUCIL) 58.6 % powder, Take 1 packet by mouth at bedtime. , Disp: , Rfl:  .  simvastatin (ZOCOR) 20 MG tablet, Take 20 mg by mouth at bedtime., Disp: , Rfl:  .  sodium chloride (OCEAN) 0.65 % SOLN nasal spray, Place 1-2 sprays into both nostrils every morning., Disp: , Rfl:  .  triamterene-hydrochlorothiazide (MAXZIDE-25) 37.5-25 MG per tablet, Take 1 tablet by mouth every morning., Disp: , Rfl:  .  triazolam (HALCION) 0.125 MG tablet, Take 0.062 mg by mouth at bedtime as needed for sleep., Disp: , Rfl:     Allergies  Allergen Reactions  . Penicillins Hives  . Sulfa Antibiotics Nausea And Vomiting    History  Substance Use Topics  . Smoking status: Never Smoker   . Smokeless tobacco: Not on file  . Alcohol Use: No      Review of Systems  Constitutional: Negative.   HENT: Positive for hearing loss. Negative for congestion, ear discharge, ear pain, nosebleeds, sore throat and tinnitus.   Eyes: Negative.   Respiratory: Negative.  Negative  for stridor.   Cardiovascular: Negative.   Gastrointestinal: Positive for diarrhea. Negative for heartburn, nausea, vomiting, abdominal pain, constipation, blood in stool and melena.  Genitourinary: Positive for frequency. Negative for dysuria, urgency, hematuria and flank pain.  Musculoskeletal: Positive for joint pain. Negative for myalgias, back pain, falls and neck pain.       Right knee pain  Skin: Negative.   Neurological: Negative.  Negative for headaches.  Endo/Heme/Allergies: Negative.   Psychiatric/Behavioral: Negative.     Objective:  Physical Exam  Constitutional: She is oriented to  person, place, and time. She appears well-developed and well-nourished. No distress.  HENT:  Head: Normocephalic and atraumatic.  Right Ear: External ear normal.  Left Ear: External ear normal.  Nose: Nose normal.  Mouth/Throat: Oropharynx is clear and moist.  Eyes: Conjunctivae and EOM are normal.  Neck: Normal range of motion. Neck supple.  Cardiovascular: Normal rate, regular rhythm, normal heart sounds and intact distal pulses.   No murmur heard. Respiratory: Effort normal and breath sounds normal. No respiratory distress. She has no wheezes.  GI: Soft. Bowel sounds are normal. She exhibits no distension. There is no tenderness.  Musculoskeletal:  Her hips show normal range of motion with no discomfort. Her left knee shows no effusion. Range of motion is zero to 130 with no tenderness or instability. There is no deformity. Her right knee shows slight valgus, trace effusion, trace Baker's cyst. Range is 5 to 125 with moderate crepitus on range of motion, tenderness lateral greater than medial, with no instability noted.  Neurological: She is alert and oriented to person, place, and time. She has normal strength and normal reflexes. No sensory deficit.  Skin: No rash noted. She is not diaphoretic. No erythema.  Psychiatric: She has a normal mood and affect. Her behavior is normal.    Vitals  Weight: 125 lb Height: 64in Body Surface Area: 1.6 m Body Mass Index: 21.46 kg/m  BP: 145/80 (Sitting, Left Arm, Standard) Pulse: 65 bpm  Imaging Review Plain radiographs demonstrate severe degenerative joint disease of the right knee(s). The overall alignment ismild valgus. The bone quality appears to be good for age and reported activity level.  Assessment/Plan:  End stage primary osteoarthritis, right knee   The patient history, physical examination, clinical judgment of the provider and imaging studies are consistent with end stage degenerative joint disease of the right  knee(s) and total knee arthroplasty is deemed medically necessary. The treatment options including medical management, injection therapy arthroscopy and arthroplasty were discussed at length. The risks and benefits of total knee arthroplasty were presented and reviewed. The risks due to aseptic loosening, infection, stiffness, patella tracking problems, thromboembolic complications and other imponderables were discussed. The patient acknowledged the explanation, agreed to proceed with the plan and consent was signed. Patient is being admitted for inpatient treatment for surgery, pain control, PT, OT, prophylactic antibiotics, VTE prophylaxis, progressive ambulation and ADL's and discharge planning. The patient is planning to be discharged home with home health services    TXA IV PCP: Dr. Baron HamperPerini  Vannie Hilgert, PA-C

## 2014-04-25 ENCOUNTER — Encounter (HOSPITAL_COMMUNITY)
Admission: RE | Admit: 2014-04-25 | Discharge: 2014-04-25 | Disposition: A | Discharge status: Home / Self Care | Payer: Medicare Other | Source: Ambulatory Visit | Attending: Orthopedic Surgery | Admitting: Orthopedic Surgery

## 2014-04-25 ENCOUNTER — Encounter (HOSPITAL_COMMUNITY): Payer: Self-pay

## 2014-04-25 ENCOUNTER — Ambulatory Visit
Admission: RE | Admit: 2014-04-25 | Discharge: 2014-04-25 | Disposition: A | Discharge status: Home / Self Care | Payer: Medicare Other | Source: Ambulatory Visit | Attending: Interventional Radiology | Admitting: Interventional Radiology

## 2014-04-25 DIAGNOSIS — Z01812 Encounter for preprocedural laboratory examination: Secondary | ICD-10-CM | POA: Diagnosis present

## 2014-04-25 DIAGNOSIS — Z01810 Encounter for preprocedural cardiovascular examination: Secondary | ICD-10-CM | POA: Insufficient documentation

## 2014-04-25 DIAGNOSIS — I83811 Varicose veins of right lower extremities with pain: Secondary | ICD-10-CM

## 2014-04-25 LAB — CBC
HEMATOCRIT: 41.9 % (ref 36.0–46.0)
HEMOGLOBIN: 13.6 g/dL (ref 12.0–15.0)
MCH: 29.2 pg (ref 26.0–34.0)
MCHC: 32.5 g/dL (ref 30.0–36.0)
MCV: 89.9 fL (ref 78.0–100.0)
Platelets: 274 10*3/uL (ref 150–400)
RBC: 4.66 MIL/uL (ref 3.87–5.11)
RDW: 14 % (ref 11.5–15.5)
WBC: 7.1 10*3/uL (ref 4.0–10.5)

## 2014-04-25 LAB — COMPREHENSIVE METABOLIC PANEL
ALBUMIN: 4.7 g/dL (ref 3.5–5.2)
ALT: 30 U/L (ref 0–35)
ANION GAP: 11 (ref 5–15)
AST: 29 U/L (ref 0–37)
Alkaline Phosphatase: 77 U/L (ref 39–117)
BUN: 15 mg/dL (ref 6–23)
CO2: 27 mmol/L (ref 19–32)
CREATININE: 0.71 mg/dL (ref 0.50–1.10)
Calcium: 9.5 mg/dL (ref 8.4–10.5)
Chloride: 102 mmol/L (ref 96–112)
GFR calc Af Amer: >90 mL/min (ref >90)
GFR calc non Af Amer: 79 mL/min — ABNORMAL LOW (ref >90)
Glucose, Bld: 85 mg/dL (ref 70–99)
Potassium: 4.3 mmol/L (ref 3.5–5.1)
Sodium: 140 mmol/L (ref 135–145)
TOTAL PROTEIN: 7.7 g/dL (ref 6.0–8.3)
Total Bilirubin: 0.6 mg/dL (ref 0.3–1.2)

## 2014-04-25 LAB — PROTIME-INR
INR: 0.99 (ref 0.00–1.49)
PROTHROMBIN TIME: 13.2 s (ref 11.6–15.2)

## 2014-04-25 LAB — URINALYSIS, ROUTINE W REFLEX MICROSCOPIC
Bilirubin Urine: NEGATIVE
GLUCOSE, UA: NEGATIVE mg/dL
Hgb urine dipstick: NEGATIVE
Ketones, ur: NEGATIVE mg/dL
Leukocytes, UA: NEGATIVE
NITRITE: NEGATIVE
Protein, ur: NEGATIVE mg/dL
SPECIFIC GRAVITY, URINE: 1.006 (ref 1.005–1.030)
Urobilinogen, UA: 0.2 mg/dL (ref 0.0–1.0)
pH: 6.5 (ref 5.0–8.0)

## 2014-04-25 LAB — SURGICAL PCR SCREEN
MRSA, PCR: NEGATIVE
Staphylococcus aureus: NEGATIVE

## 2014-04-25 LAB — ABO/RH: ABO/RH(D): B POS

## 2014-04-25 LAB — APTT: aPTT: 30 seconds (ref 24–37)

## 2014-04-25 NOTE — Progress Notes (Signed)
Patient ID: Donna Callahan, female   DOB: Dec 03, 1930, 81 y.o.   MRN: [id number]    Chief Complaint: 6 months status post right lower extremity GSV transcatheter laser occlusion for varicose veins and leg pain  Referring Physician(s): perini  History of Present Illness: Donna Callahan is a 81 y.o. female with a history of bilateral GSV venous insufficiency, varicose veins and leg pain. She is most recently status post right GSV transcatheter laser occlusion for venous insufficiency and enlarged right varicosities. She has recovered at home very well. Right lower leg pain has resolved. No physical limitations. She remains very active for her age. Residual right anterior knee and anterior tibial varicose veins remain. Overall she is doing very well. She is scheduled for right knee replacement next week.  Past Medical History  Diagnosis Date  . Hypertension   . High cholesterol   . Hard of hearing   . Varicose veins     Past Surgical History  Procedure Laterality Date  . Tonsillectomy    . Stapedectomy      bil ears  . Varicose vein surgery      Allergies: Penicillins and Sulfa antibiotics  Medications: Prior to Admission medications   Medication Sig Start Date End Date Taking? Authorizing Provider  acetaminophen (TYLENOL) 500 MG tablet Take 500-1,000 mg by mouth every morning.    Historical Provider, MD  aspirin 81 MG tablet Take 81 mg by mouth daily at 12 noon.     Historical Provider, MD  CALCIUM-VITAMIN D PO Take 1 tablet by mouth 3 (three) times daily.    Historical Provider, MD  diltiazem (TIAZAC) 240 MG 24 hr capsule Take 240 mg by mouth every morning.     Historical Provider, MD  irbesartan (AVAPRO) 150 MG tablet Take 150 mg by mouth at bedtime.    Historical Provider, MD  loperamide (IMODIUM) 2 MG capsule Take by mouth as needed for diarrhea or loose stools.    Historical Provider, MD  Multiple Vitamin (MULTIVITAMIN WITH MINERALS) TABS tablet Take 1 tablet by mouth every  morning.    Historical Provider, MD  Omega-3 Fatty Acids (FISH OIL PO) Take 1 capsule by mouth daily at 12 noon.    Historical Provider, MD  Polyethyl Glycol-Propyl Glycol (SYSTANE OP) Apply 1 drop to eye every morning.    Historical Provider, MD  potassium chloride (KLOR-CON) 20 MEQ packet Take 20 mEq by mouth 3 (three) times daily.     Historical Provider, MD  PredniSONE 5 MG KIT Take 1 kit (5 mg total) by mouth daily after breakfast. Prednisone 5 mg 6 day dosepack.  Take as directed. Patient not taking: Reported on 04/20/2014 05/02/11   Harden Mo, MD  psyllium (METAMUCIL) 58.6 % powder Take 1 packet by mouth at bedtime.     Historical Provider, MD  simvastatin (ZOCOR) 20 MG tablet Take 20 mg by mouth at bedtime.    Historical Provider, MD  sodium chloride (OCEAN) 0.65 % SOLN nasal spray Place 1-2 sprays into both nostrils every morning.    Historical Provider, MD  triamterene-hydrochlorothiazide (MAXZIDE-25) 37.5-25 MG per tablet Take 1 tablet by mouth every morning.    Historical Provider, MD  triazolam (HALCION) 0.125 MG tablet Take 0.062 mg by mouth at bedtime as needed for sleep.    Historical Provider, MD     No family history on file.  History   Social History  . Marital Status: Widowed    Spouse Name: [name]  . Number of  Children: [name]  . Years of Education: [name]   Social History Main Topics  . Smoking status: Never Smoker   . Smokeless tobacco: Not on file  . Alcohol Use: No  . Drug Use: No  . Sexual Activity: Not on file   Other Topics Concern  . Not on file   Social History Narrative    Review of Systems: A 12 point ROS discussed and pertinent positives are indicated in the HPI above.  All other systems are negative.  Review of Systems  Constitutional: Negative for fever, activity change, appetite change, fatigue and unexpected weight change.  Respiratory: Negative for chest tightness and shortness of breath.   Cardiovascular: Negative for chest pain.    Gastrointestinal: Negative for abdominal distention.    Vital Signs: There were no vitals taken for this visit.  Physical Exam  Constitutional: She appears well-developed and well-nourished. No distress.  Musculoskeletal: She exhibits no edema or tenderness.  Moderate-sized residual varicosities noted of the right lower extremity overlying the patella region and extending along the anterior tibial area. No significant edema. Skin intact. No ulcerations. No signs of phlebitis or cellulitis.  Skin: Skin is warm and dry. No rash noted. She is not diaphoretic. No erythema. No pallor.    Imaging: Ultrasound performed today demonstrates no evidence of right lower extremity DVT. Right GSV treated segment remains occluded but there are short segment areas of partial recanalization and thigh region. No significant recannulization. Anterior knee and tibial region varicosities remain patent and visible.  Labs:  CBC:  Recent Labs  04/25/14 1030  WBC 7.1  HGB 13.6  HCT 41.9  PLT 274    COAGS:  Recent Labs  04/25/14 1030  INR 0.99  APTT 30    BMP:  Recent Labs  04/25/14 1030  NA 140  K 4.3  CL 102  CO2 27  GLUCOSE 85  BUN 15  CALCIUM 9.5  CREATININE 0.71  GFRNONAA 79*  GFRAA >90    LIVER FUNCTION TESTS:  Recent Labs  04/25/14 1030  BILITOT 0.6  AST 29  ALT 30  ALKPHOS 77  PROT 7.7  ALBUMIN 4.7    Assessment and Plan:  6 months status post right GSV transcatheter laser occlusion. Treated segment demonstrates areas of short segment recannalization in the thigh but overall remains occluded with evidence of scarring and retraction. Leg pain and fatigue has resolved. She is very active but no physical limitations. Residual right anterior knee and tibial region varicosities noted. She would like these injected in the future. However, she is scheduled for knee replacement next week.  Recommend outpatient follow-up in 6 months once she has recovered from the knee  replacement. Consider sclerotherapy at that time.  SignedGreggory Keen 04/25/2014, 3:06 PM   I spent a total of15 Minutes in face to face in clinical consultation, greater than 50% of which was counseling/coordinating care for

## 2014-05-02 ENCOUNTER — Inpatient Hospital Stay (HOSPITAL_COMMUNITY): Payer: Medicare Other | Admitting: Anesthesiology

## 2014-05-02 ENCOUNTER — Encounter (HOSPITAL_COMMUNITY): Payer: Self-pay | Admitting: Anesthesiology

## 2014-05-02 ENCOUNTER — Encounter (HOSPITAL_COMMUNITY)
Admission: RE | Disposition: A | Discharge status: Home / Self Care | Payer: Self-pay | Source: Ambulatory Visit | Attending: Orthopedic Surgery

## 2014-05-02 ENCOUNTER — Inpatient Hospital Stay (HOSPITAL_COMMUNITY)
Admission: RE | Admit: 2014-05-02 | Discharge: 2014-05-04 | DRG: 470 | Disposition: A | Discharge status: Home / Self Care | Payer: Medicare Other | Source: Ambulatory Visit | Attending: Orthopedic Surgery | Admitting: Orthopedic Surgery

## 2014-05-02 DIAGNOSIS — Z880 Allergy status to penicillin: Secondary | ICD-10-CM | POA: Diagnosis not present

## 2014-05-02 DIAGNOSIS — M1711 Unilateral primary osteoarthritis, right knee: Secondary | ICD-10-CM

## 2014-05-02 DIAGNOSIS — M179 Osteoarthritis of knee, unspecified: Secondary | ICD-10-CM | POA: Diagnosis present

## 2014-05-02 DIAGNOSIS — I872 Venous insufficiency (chronic) (peripheral): Secondary | ICD-10-CM | POA: Diagnosis present

## 2014-05-02 DIAGNOSIS — M1710 Unilateral primary osteoarthritis, unspecified knee: Secondary | ICD-10-CM | POA: Diagnosis present

## 2014-05-02 DIAGNOSIS — E780 Pure hypercholesterolemia: Secondary | ICD-10-CM | POA: Diagnosis present

## 2014-05-02 DIAGNOSIS — Z7982 Long term (current) use of aspirin: Secondary | ICD-10-CM | POA: Diagnosis not present

## 2014-05-02 DIAGNOSIS — Z882 Allergy status to sulfonamides status: Secondary | ICD-10-CM

## 2014-05-02 DIAGNOSIS — I8390 Asymptomatic varicose veins of unspecified lower extremity: Secondary | ICD-10-CM | POA: Diagnosis present

## 2014-05-02 DIAGNOSIS — I10 Essential (primary) hypertension: Secondary | ICD-10-CM | POA: Diagnosis present

## 2014-05-02 DIAGNOSIS — H9190 Unspecified hearing loss, unspecified ear: Secondary | ICD-10-CM | POA: Diagnosis present

## 2014-05-02 DIAGNOSIS — M25561 Pain in right knee: Secondary | ICD-10-CM | POA: Diagnosis present

## 2014-05-02 HISTORY — PX: TOTAL KNEE ARTHROPLASTY: SHX125

## 2014-05-02 LAB — TYPE AND SCREEN
ABO/RH(D): B POS
ANTIBODY SCREEN: NEGATIVE

## 2014-05-02 SURGERY — ARTHROPLASTY, KNEE, TOTAL
Anesthesia: Spinal | Site: Knee | Laterality: Right

## 2014-05-02 MED ORDER — SODIUM CHLORIDE 0.9 % IJ SOLN
INTRAMUSCULAR | Status: AC
  Filled 2014-05-02: qty 10

## 2014-05-02 MED ORDER — DEXAMETHASONE SODIUM PHOSPHATE 10 MG/ML IJ SOLN
10.0000 mg | Freq: Once | INTRAMUSCULAR | Status: AC
  Administered 2014-05-02: 10 mg via INTRAVENOUS

## 2014-05-02 MED ORDER — CHLORHEXIDINE GLUCONATE 4 % EX LIQD: 60.0000 mL | Freq: Once | CUTANEOUS | Status: DC

## 2014-05-02 MED ORDER — BUPIVACAINE HCL 0.25 % IJ SOLN
INTRAMUSCULAR | Status: DC | PRN
  Administered 2014-05-02: 30 mL

## 2014-05-02 MED ORDER — METOCLOPRAMIDE HCL 10 MG PO TABS: 5.0000 mg | ORAL_TABLET | Freq: Three times a day (TID) | ORAL | Status: DC | PRN

## 2014-05-02 MED ORDER — SODIUM CHLORIDE 0.9 % IV SOLN: INTRAVENOUS | Status: DC

## 2014-05-02 MED ORDER — ONDANSETRON HCL 4 MG/2ML IJ SOLN
4.0000 mg | Freq: Four times a day (QID) | INTRAMUSCULAR | Status: DC | PRN
  Administered 2014-05-04: 4 mg via INTRAVENOUS
  Filled 2014-05-02: qty 2

## 2014-05-02 MED ORDER — TRIAZOLAM 0.125 MG PO TABS: 0.0620 mg | ORAL_TABLET | Freq: Every evening | ORAL | Status: DC | PRN

## 2014-05-02 MED ORDER — MORPHINE SULFATE 2 MG/ML IJ SOLN: 1.0000 mg | INTRAMUSCULAR | Status: DC | PRN

## 2014-05-02 MED ORDER — ONDANSETRON HCL 4 MG/2ML IJ SOLN: 4.0000 mg | Freq: Once | INTRAMUSCULAR | Status: DC | PRN

## 2014-05-02 MED ORDER — DOCUSATE SODIUM 100 MG PO CAPS
100.0000 mg | ORAL_CAPSULE | Freq: Two times a day (BID) | ORAL | Status: DC
  Administered 2014-05-03 – 2014-05-04 (×2): 100 mg via ORAL

## 2014-05-02 MED ORDER — ONDANSETRON HCL 4 MG PO TABS: 4.0000 mg | ORAL_TABLET | Freq: Four times a day (QID) | ORAL | Status: DC | PRN

## 2014-05-02 MED ORDER — BUPIVACAINE LIPOSOME 1.3 % IJ SUSP
20.0000 mL | Freq: Once | INTRAMUSCULAR | Status: DC
  Filled 2014-05-02: qty 20

## 2014-05-02 MED ORDER — IRBESARTAN 150 MG PO TABS
150.0000 mg | ORAL_TABLET | Freq: Every day | ORAL | Status: DC
  Administered 2014-05-02 – 2014-05-03 (×2): 150 mg via ORAL
  Filled 2014-05-02 (×3): qty 1

## 2014-05-02 MED ORDER — METHOCARBAMOL 1000 MG/10ML IJ SOLN
500.0000 mg | Freq: Four times a day (QID) | INTRAVENOUS | Status: DC | PRN
  Filled 2014-05-02: qty 5

## 2014-05-02 MED ORDER — TRANEXAMIC ACID 100 MG/ML IV SOLN
1000.0000 mg | INTRAVENOUS | Status: AC
  Administered 2014-05-02: 1000 mg via INTRAVENOUS
  Filled 2014-05-02: qty 10

## 2014-05-02 MED ORDER — PROPOFOL 10 MG/ML IV BOLUS
INTRAVENOUS | Status: AC
  Filled 2014-05-02: qty 20

## 2014-05-02 MED ORDER — SIMVASTATIN 20 MG PO TABS
20.0000 mg | ORAL_TABLET | Freq: Every day | ORAL | Status: DC
  Filled 2014-05-02: qty 1

## 2014-05-02 MED ORDER — METOCLOPRAMIDE HCL 5 MG/ML IJ SOLN: 5.0000 mg | Freq: Three times a day (TID) | INTRAMUSCULAR | Status: DC | PRN

## 2014-05-02 MED ORDER — ACETAMINOPHEN 10 MG/ML IV SOLN
1000.0000 mg | Freq: Once | INTRAVENOUS | Status: AC
  Administered 2014-05-02: 1000 mg via INTRAVENOUS
  Filled 2014-05-02: qty 100

## 2014-05-02 MED ORDER — FENTANYL CITRATE 0.05 MG/ML IJ SOLN
INTRAMUSCULAR | Status: AC
  Filled 2014-05-02: qty 2

## 2014-05-02 MED ORDER — TRAMADOL HCL 50 MG PO TABS: 50.0000 mg | ORAL_TABLET | Freq: Four times a day (QID) | ORAL | Status: DC | PRN

## 2014-05-02 MED ORDER — HYDROMORPHONE HCL 1 MG/ML IJ SOLN: 0.2500 mg | INTRAMUSCULAR | Status: DC | PRN

## 2014-05-02 MED ORDER — MENTHOL 3 MG MT LOZG: 1.0000 | LOZENGE | OROMUCOSAL | Status: DC | PRN

## 2014-05-02 MED ORDER — BISACODYL 10 MG RE SUPP: 10.0000 mg | Freq: Every day | RECTAL | Status: DC | PRN

## 2014-05-02 MED ORDER — DEXAMETHASONE SODIUM PHOSPHATE 10 MG/ML IJ SOLN
INTRAMUSCULAR | Status: AC
  Filled 2014-05-02: qty 1

## 2014-05-02 MED ORDER — LACTATED RINGERS IV SOLN
INTRAVENOUS | Status: DC
  Administered 2014-05-02: 13:00:00 via INTRAVENOUS

## 2014-05-02 MED ORDER — MIDAZOLAM HCL 5 MG/5ML IJ SOLN
INTRAMUSCULAR | Status: DC | PRN
  Administered 2014-05-02: 1 mg via INTRAVENOUS

## 2014-05-02 MED ORDER — MIDAZOLAM HCL 2 MG/2ML IJ SOLN
INTRAMUSCULAR | Status: AC
  Filled 2014-05-02: qty 2

## 2014-05-02 MED ORDER — PHENOL 1.4 % MT LIQD
1.0000 | OROMUCOSAL | Status: DC | PRN
Start: 2014-05-02 — End: 2014-05-04

## 2014-05-02 MED ORDER — PHENYLEPHRINE HCL 10 MG/ML IJ SOLN
INTRAMUSCULAR | Status: DC | PRN
  Administered 2014-05-02 (×4): 40 ug via INTRAVENOUS

## 2014-05-02 MED ORDER — VANCOMYCIN HCL IN DEXTROSE 1-5 GM/200ML-% IV SOLN
1000.0000 mg | INTRAVENOUS | Status: AC
  Administered 2014-05-02: 1000 mg via INTRAVENOUS
  Filled 2014-05-02: qty 200

## 2014-05-02 MED ORDER — OXYCODONE HCL 5 MG PO TABS
5.0000 mg | ORAL_TABLET | ORAL | Status: DC | PRN
  Administered 2014-05-02: 2.5 mg via ORAL
  Administered 2014-05-03: 10 mg via ORAL
  Administered 2014-05-03: 5 mg via ORAL
  Administered 2014-05-03 – 2014-05-04 (×4): 10 mg via ORAL
  Filled 2014-05-02 (×3): qty 1
  Filled 2014-05-02 (×3): qty 2
  Filled 2014-05-02: qty 1
  Filled 2014-05-02: qty 2

## 2014-05-02 MED ORDER — PHENYLEPHRINE 40 MCG/ML (10ML) SYRINGE FOR IV PUSH (FOR BLOOD PRESSURE SUPPORT)
PREFILLED_SYRINGE | INTRAVENOUS | Status: AC
  Filled 2014-05-02: qty 10

## 2014-05-02 MED ORDER — SODIUM CHLORIDE 0.9 % IJ SOLN
INTRAMUSCULAR | Status: AC
  Filled 2014-05-02: qty 50

## 2014-05-02 MED ORDER — 0.9 % SODIUM CHLORIDE (POUR BTL) OPTIME
TOPICAL | Status: DC | PRN
  Administered 2014-05-02: 1000 mL

## 2014-05-02 MED ORDER — POTASSIUM CHLORIDE ER 10 MEQ PO TBCR
20.0000 meq | EXTENDED_RELEASE_TABLET | Freq: Three times a day (TID) | ORAL | Status: DC
  Administered 2014-05-02 – 2014-05-04 (×5): 20 meq via ORAL
  Filled 2014-05-02 (×7): qty 2

## 2014-05-02 MED ORDER — ATORVASTATIN CALCIUM 10 MG PO TABS
10.0000 mg | ORAL_TABLET | Freq: Every day | ORAL | Status: DC
  Administered 2014-05-02 – 2014-05-03 (×2): 10 mg via ORAL
  Filled 2014-05-02 (×3): qty 1

## 2014-05-02 MED ORDER — FENTANYL CITRATE 0.05 MG/ML IJ SOLN
INTRAMUSCULAR | Status: DC | PRN
  Administered 2014-05-02: 25 ug via INTRAVENOUS

## 2014-05-02 MED ORDER — SODIUM CHLORIDE 0.9 % IJ SOLN
INTRAMUSCULAR | Status: DC | PRN
  Administered 2014-05-02: 30 mL via INTRAVENOUS

## 2014-05-02 MED ORDER — FLEET ENEMA 7-19 GM/118ML RE ENEM: 1.0000 | ENEMA | Freq: Once | RECTAL | Status: AC | PRN

## 2014-05-02 MED ORDER — ACETAMINOPHEN 650 MG RE SUPP: 650.0000 mg | Freq: Four times a day (QID) | RECTAL | Status: DC | PRN

## 2014-05-02 MED ORDER — BUPIVACAINE LIPOSOME 1.3 % IJ SUSP
INTRAMUSCULAR | Status: DC | PRN
  Administered 2014-05-02: 20 mL

## 2014-05-02 MED ORDER — POLYETHYLENE GLYCOL 3350 17 G PO PACK: 17.0000 g | PACK | Freq: Every day | ORAL | Status: DC | PRN

## 2014-05-02 MED ORDER — ACETAMINOPHEN 500 MG PO TABS
1000.0000 mg | ORAL_TABLET | Freq: Four times a day (QID) | ORAL | Status: DC
  Administered 2014-05-02: 1000 mg via ORAL
  Filled 2014-05-02 (×2): qty 2

## 2014-05-02 MED ORDER — LACTATED RINGERS IV SOLN
INTRAVENOUS | Status: DC | PRN
  Administered 2014-05-02: 14:00:00 via INTRAVENOUS

## 2014-05-02 MED ORDER — EPHEDRINE SULFATE 50 MG/ML IJ SOLN
INTRAMUSCULAR | Status: AC
  Filled 2014-05-02: qty 1

## 2014-05-02 MED ORDER — PROPOFOL INFUSION 10 MG/ML OPTIME
INTRAVENOUS | Status: DC | PRN
  Administered 2014-05-02: 75 ug/kg/min via INTRAVENOUS

## 2014-05-02 MED ORDER — TRIAMTERENE-HCTZ 37.5-25 MG PO TABS
1.0000 | ORAL_TABLET | Freq: Every morning | ORAL | Status: DC
  Administered 2014-05-03 – 2014-05-04 (×2): 1 via ORAL
  Filled 2014-05-02 (×2): qty 1

## 2014-05-02 MED ORDER — ONDANSETRON HCL 4 MG/2ML IJ SOLN
INTRAMUSCULAR | Status: DC | PRN
  Administered 2014-05-02: 4 mg via INTRAVENOUS

## 2014-05-02 MED ORDER — RIVAROXABAN 10 MG PO TABS
10.0000 mg | ORAL_TABLET | Freq: Every day | ORAL | Status: DC
  Administered 2014-05-03 – 2014-05-04 (×2): 10 mg via ORAL
  Filled 2014-05-02 (×3): qty 1

## 2014-05-02 MED ORDER — BUPIVACAINE HCL (PF) 0.25 % IJ SOLN
INTRAMUSCULAR | Status: AC
  Filled 2014-05-02: qty 30

## 2014-05-02 MED ORDER — METHOCARBAMOL 500 MG PO TABS
500.0000 mg | ORAL_TABLET | Freq: Four times a day (QID) | ORAL | Status: DC | PRN
  Administered 2014-05-02 – 2014-05-04 (×5): 500 mg via ORAL
  Filled 2014-05-02 (×5): qty 1

## 2014-05-02 MED ORDER — ONDANSETRON HCL 4 MG/2ML IJ SOLN
INTRAMUSCULAR | Status: AC
  Filled 2014-05-02: qty 2

## 2014-05-02 MED ORDER — VANCOMYCIN HCL IN DEXTROSE 1-5 GM/200ML-% IV SOLN
1000.0000 mg | Freq: Two times a day (BID) | INTRAVENOUS | Status: AC
  Administered 2014-05-03 (×2): 1000 mg via INTRAVENOUS
  Filled 2014-05-02 (×3): qty 200

## 2014-05-02 MED ORDER — DIPHENHYDRAMINE HCL 12.5 MG/5ML PO ELIX: 12.5000 mg | ORAL_SOLUTION | ORAL | Status: DC | PRN

## 2014-05-02 MED ORDER — DILTIAZEM HCL ER BEADS 240 MG PO CP24
240.0000 mg | ORAL_CAPSULE | Freq: Every morning | ORAL | Status: DC
  Filled 2014-05-02: qty 1

## 2014-05-02 MED ORDER — ACETAMINOPHEN 325 MG PO TABS
650.0000 mg | ORAL_TABLET | Freq: Four times a day (QID) | ORAL | Status: DC | PRN
  Administered 2014-05-03 – 2014-05-04 (×2): 650 mg via ORAL
  Filled 2014-05-02 (×2): qty 2

## 2014-05-02 MED ORDER — DEXTROSE-NACL 5-0.9 % IV SOLN
INTRAVENOUS | Status: DC
  Administered 2014-05-02 – 2014-05-03 (×2): 75 mL/h via INTRAVENOUS

## 2014-05-02 MED ORDER — SODIUM CHLORIDE 0.9 % IR SOLN
Status: DC | PRN
  Administered 2014-05-02: 1000 mL

## 2014-05-02 SURGICAL SUPPLY — 62 items
BAG DECANTER FOR FLEXI CONT (MISCELLANEOUS) ×2 IMPLANT
BAG SPEC THK2 15X12 ZIP CLS (MISCELLANEOUS) ×1
BAG ZIPLOCK 12X15 (MISCELLANEOUS) ×2 IMPLANT
BANDAGE ELASTIC 6 VELCRO ST LF (GAUZE/BANDAGES/DRESSINGS) ×2 IMPLANT
BANDAGE ESMARK 6X9 LF (GAUZE/BANDAGES/DRESSINGS) ×1 IMPLANT
BLADE SAG 18X100X1.27 (BLADE) ×2 IMPLANT
BLADE SAW SGTL 11.0X1.19X90.0M (BLADE) ×2 IMPLANT
BNDG CMPR 9X6 STRL LF SNTH (GAUZE/BANDAGES/DRESSINGS) ×1
BNDG ESMARK 6X9 LF (GAUZE/BANDAGES/DRESSINGS) ×2
BOWL SMART MIX CTS (DISPOSABLE) ×2 IMPLANT
CAP KNEE TOTAL 3 SIGMA ×1 IMPLANT
CEMENT HV SMART SET (Cement) ×4 IMPLANT
CUFF TOURN SGL QUICK 34 (TOURNIQUET CUFF) ×2
CUFF TRNQT CYL 34X4X40X1 (TOURNIQUET CUFF) ×1 IMPLANT
DECANTER SPIKE VIAL GLASS SM (MISCELLANEOUS) ×2 IMPLANT
DRAPE EXTREMITY T 121X128X90 (DRAPE) ×2 IMPLANT
DRAPE POUCH INSTRU U-SHP 10X18 (DRAPES) ×2 IMPLANT
DRAPE U-SHAPE 47X51 STRL (DRAPES) ×2 IMPLANT
DRSG ADAPTIC 3X8 NADH LF (GAUZE/BANDAGES/DRESSINGS) ×2 IMPLANT
DRSG PAD ABDOMINAL 8X10 ST (GAUZE/BANDAGES/DRESSINGS) ×2 IMPLANT
DURAPREP 26ML APPLICATOR (WOUND CARE) ×2 IMPLANT
ELECT REM PT RETURN 9FT ADLT (ELECTROSURGICAL) ×2
ELECTRODE REM PT RTRN 9FT ADLT (ELECTROSURGICAL) ×1 IMPLANT
EVACUATOR 1/8 PVC DRAIN (DRAIN) ×2 IMPLANT
FACESHIELD WRAPAROUND (MASK) ×8 IMPLANT
FACESHIELD WRAPAROUND OR TEAM (MASK) ×5 IMPLANT
GAUZE SPONGE 4X4 12PLY STRL (GAUZE/BANDAGES/DRESSINGS) ×2 IMPLANT
GLOVE BIO SURGEON STRL SZ7.5 (GLOVE) IMPLANT
GLOVE BIO SURGEON STRL SZ8 (GLOVE) ×2 IMPLANT
GLOVE BIOGEL PI IND STRL 6.5 (GLOVE) IMPLANT
GLOVE BIOGEL PI IND STRL 8 (GLOVE) ×1 IMPLANT
GLOVE BIOGEL PI INDICATOR 6.5 (GLOVE)
GLOVE BIOGEL PI INDICATOR 8 (GLOVE) ×1
GLOVE SURG SS PI 6.5 STRL IVOR (GLOVE) IMPLANT
GOWN STRL REUS W/TWL LRG LVL3 (GOWN DISPOSABLE) ×2 IMPLANT
GOWN STRL REUS W/TWL XL LVL3 (GOWN DISPOSABLE) IMPLANT
HANDPIECE INTERPULSE COAX TIP (DISPOSABLE) ×2
IMMOBILIZER KNEE 20 (SOFTGOODS) ×1 IMPLANT
KIT BASIN OR (CUSTOM PROCEDURE TRAY) ×2 IMPLANT
MANIFOLD NEPTUNE II (INSTRUMENTS) ×2 IMPLANT
NDL SAFETY ECLIPSE 18X1.5 (NEEDLE) ×2 IMPLANT
NEEDLE HYPO 18GX1.5 SHARP (NEEDLE) ×4
NS IRRIG 1000ML POUR BTL (IV SOLUTION) ×2 IMPLANT
PACK TOTAL JOINT (CUSTOM PROCEDURE TRAY) ×2 IMPLANT
PADDING CAST COTTON 6X4 STRL (CAST SUPPLIES) ×4 IMPLANT
PEN SKIN MARKING BROAD (MISCELLANEOUS) ×2 IMPLANT
POSITIONER SURGICAL ARM (MISCELLANEOUS) ×2 IMPLANT
SET HNDPC FAN SPRY TIP SCT (DISPOSABLE) ×1 IMPLANT
STRIP CLOSURE SKIN 1/2X4 (GAUZE/BANDAGES/DRESSINGS) ×3 IMPLANT
SUCTION FRAZIER 12FR DISP (SUCTIONS) ×2 IMPLANT
SUT MNCRL AB 4-0 PS2 18 (SUTURE) ×2 IMPLANT
SUT VIC AB 2-0 CT1 27 (SUTURE) ×6
SUT VIC AB 2-0 CT1 TAPERPNT 27 (SUTURE) ×3 IMPLANT
SUT VLOC 180 0 24IN GS25 (SUTURE) ×2 IMPLANT
SYR 20CC LL (SYRINGE) ×2 IMPLANT
SYR 50ML LL SCALE MARK (SYRINGE) ×2 IMPLANT
TOWEL OR 17X26 10 PK STRL BLUE (TOWEL DISPOSABLE) ×2 IMPLANT
TOWEL OR NON WOVEN STRL DISP B (DISPOSABLE) IMPLANT
TRAY FOLEY CATH 14FRSI W/METER (CATHETERS) ×2 IMPLANT
WATER STERILE IRR 1500ML POUR (IV SOLUTION) ×2 IMPLANT
WRAP KNEE MAXI GEL POST OP (GAUZE/BANDAGES/DRESSINGS) ×2 IMPLANT
YANKAUER SUCT BULB TIP 10FT TU (MISCELLANEOUS) ×2 IMPLANT

## 2014-05-02 NOTE — Transfer of Care (Signed)
Immediate Anesthesia Transfer of Care Note  Patient: Donna FermoCarolyn C Truby  Procedure(s) Performed: Procedure(s): RIGHT TOTAL KNEE ARTHROPLASTY (Right)  Patient Location: PACU  Anesthesia Type:SpinalT10  Level of Consciousness:  sedated, patient cooperative and responds to stimulation  Airway & Oxygen Therapy:Patient Spontanous Breathing and Patient connected to face mask oxgen  Post-op Assessment:  Report given to PACU RN and Post -op Vital signs reviewed and stable  Post vital signs:  Reviewed and stable  Last Vitals:  Filed Vitals:   05/02/14 1201  BP: 172/91  Pulse: 86  Temp: 36.6 C  Resp: 16    Complications: No apparent anesthesia complications

## 2014-05-02 NOTE — Anesthesia Postprocedure Evaluation (Signed)
  Anesthesia Post-op Note  Patient: Donna FermoCarolyn C Rosemeyer  Procedure(s) Performed: Procedure(s): RIGHT TOTAL KNEE ARTHROPLASTY (Right)  Patient Location: PACU  Anesthesia Type:Spinal  Level of Consciousness: awake, alert , oriented and patient cooperative  Airway and Oxygen Therapy: Patient Spontanous Breathing  Post-op Pain: mild  Post-op Assessment: Post-op Vital signs reviewed, Patient's Cardiovascular Status Stable, Respiratory Function Stable, Patent Airway, No signs of Nausea or vomiting and Pain level controlled  Post-op Vital Signs: stable  Last Vitals:  Filed Vitals:   05/02/14 1600  BP: 133/71  Pulse: 52  Temp:   Resp: 13    Complications: No apparent anesthesia complications

## 2014-05-02 NOTE — Interval H&P Note (Signed)
History and Physical Interval Note:  05/02/2014 1:54 PM  Donna Callahan  has presented today for surgery, with the diagnosis of Right Knee Osteoarthritis  The various methods of treatment have been discussed with the patient and family. After consideration of risks, benefits and other options for treatment, the patient has consented to  Procedure(s): RIGHT TOTAL KNEE ARTHROPLASTY (Right) as a surgical intervention .  The patient's history has been reviewed, patient examined, no change in status, stable for surgery.  I have reviewed the patient's chart and labs.  Questions were answered to the patient's satisfaction.     Loanne DrillingALUISIO,Shiza Thelen V

## 2014-05-02 NOTE — Anesthesia Procedure Notes (Signed)
Spinal Patient location during procedure: OR Start time: 05/02/2014 2:00 PM End time: 05/02/2014 2:10 PM Staffing Resident/CRNA: Chanci Ojala Performed by: resident/CRNA  Preanesthetic Checklist Completed: patient identified, site marked, surgical consent, pre-op evaluation, timeout performed, IV checked, risks and benefits discussed and monitors and equipment checked Spinal Block Patient position: sitting Prep: Betadine Patient monitoring: heart rate, cardiac monitor, continuous pulse ox and blood pressure Approach: midline Location: L3-4 Injection technique: single-shot Needle Needle type: Spinocan  Needle gauge: 25 G Needle length: 9 cm Needle insertion depth: 6 cm Assessment Sensory level: T10 Additional Notes -heme, -para, VSS, lot [id number] 09/2015

## 2014-05-02 NOTE — Op Note (Signed)
Pre-operative diagnosis- Osteoarthritis  Right knee(s)  Post-operative diagnosis- Osteoarthritis Right knee(s)  Procedure-  Right  Total Knee Arthroplasty  Surgeon- Gus RankinFrank V. Xai Frerking, MD  Assistant- Avel Peacerew Perkins, PA-C   Anesthesia-  Spinal  EBL-* No blood loss amount entered *   Drains Hemovac  Tourniquet time-  Total Tourniquet Time Documented: Thigh (Right) - 26 minutes Total: Thigh (Right) - 26 minutes     Complications- None  Condition-PACU - hemodynamically stable.   Brief Clinical Note  Donna Callahan is a 81 y.o. year old female with end stage OA of her right knee with progressively worsening pain and dysfunction. She has constant pain, with activity and at rest and significant functional deficits with difficulties even with ADLs. She has had extensive non-op management including analgesics, injections of cortisone and viscosupplements, and home exercise program, but remains in significant pain with significant dysfunction.Radiographs show bone on bone arthritis lateral and patellofemoral. She presents now for right Total Knee Arthroplasty.    Procedure in detail---   The patient is brought into the operating room and positioned supine on the operating table. After successful administration of  Spinal,   a tourniquet is placed high on the  Right thigh(s) and the lower extremity is prepped and draped in the usual sterile fashion. Time out is performed by the operating team and then the  Right lower extremity is wrapped in Esmarch, knee flexed and the tourniquet inflated to 300 mmHg.       A midline incision is made with a ten blade through the subcutaneous tissue to the level of the extensor mechanism. A fresh blade is used to make a medial parapatellar arthrotomy. Soft tissue over the proximal medial tibia is subperiosteally elevated to the joint line with a knife and into the semimembranosus bursa with a Cobb elevator. Soft tissue over the proximal lateral tibia is elevated  with attention being paid to avoiding the patellar tendon on the tibial tubercle. The patella is everted, knee flexed 90 degrees and the ACL and PCL are removed. Findings are bone on bone lateral and patellofemoral with large global osteophytes.        The drill is used to create a starting hole in the distal femur and the canal is thoroughly irrigated with sterile saline to remove the fatty contents. The 5 degree Right  valgus alignment guide is placed into the femoral canal and the distal femoral cutting block is pinned to remove 10 mm off the distal femur. Resection is made with an oscillating saw.      The tibia is subluxed forward and the menisci are removed. The extramedullary alignment guide is placed referencing proximally at the medial aspect of the tibial tubercle and distally along the second metatarsal axis and tibial crest. The block is pinned to remove 2mm off the more deficient lateral  side. Resection is made with an oscillating saw. Size 3is the most appropriate size for the tibia and the proximal tibia is prepared with the modular drill and keel punch for that size.      The femoral sizing guide is placed and size 4 is most appropriate. Rotation is marked off the epicondylar axis and confirmed by creating a rectangular flexion gap at 90 degrees. The size 4 cutting block is pinned in this rotation and the anterior, posterior and chamfer cuts are made with the oscillating saw. The intercondylar block is then placed and that cut is made.      Trial size 3 tibial component, trial  size 4 narrow posterior stabilized femur and a 12.5  mm posterior stabilized rotating platform insert trial is placed. Full extension is achieved with excellent varus/valgus and anterior/posterior balance throughout full range of motion. The patella is everted and thickness measured to be 22  mm. Free hand resection is taken to 12 mm, a 38 template is placed, lug holes are drilled, trial patella is placed, and it tracks  normally. Osteophytes are removed off the posterior femur with the trial in place. All trials are removed and the cut bone surfaces prepared with pulsatile lavage. Cement is mixed and once ready for implantation, the size 3 tibial implant, size  4 narrow posterior stabilized femoral component, and the size 38 patella are cemented in place and the patella is held with the clamp. The trial insert is placed and the knee held in full extension. The Exparel (20 ml mixed with 30 ml saline) and .25% Bupivicaine, are injected into the extensor mechanism, posterior capsule, medial and lateral gutters and subcutaneous tissues.  All extruded cement is removed and once the cement is hard the permanent 12.5 mm posterior stabilized rotating platform insert is placed into the tibial tray.      The wound is copiously irrigated with saline solution and the extensor mechanism closed over a hemovac drain with #1 V-loc suture. The tourniquet is released for a total tourniquet time of 26  minutes. Flexion against gravity is 140 degrees and the patella tracks normally. Subcutaneous tissue is closed with 2.0 vicryl and subcuticular with running 4.0 Monocryl. The incision is cleaned and dried and steri-strips and a bulky sterile dressing are applied. The limb is placed into a knee immobilizer and the patient is awakened and transported to recovery in stable condition.      Please note that a surgical assistant was a medical necessity for this procedure in order to perform it in a safe and expeditious manner. Surgical assistant was necessary to retract the ligaments and vital neurovascular structures to prevent injury to them and also necessary for proper positioning of the limb to allow for anatomic placement of the prosthesis.   Gus Rankin Maxfield Gildersleeve, MD    05/02/2014, 3:01 PM

## 2014-05-02 NOTE — Anesthesia Preprocedure Evaluation (Addendum)
Anesthesia Evaluation  Patient identified by MRN, date of birth, ID band Patient awake    Reviewed: Allergy & Precautions, NPO status , Patient's Chart, lab work & pertinent test results  Airway Mallampati: II  TM Distance: >3 FB Neck ROM: Full    Dental   Pulmonary    Pulmonary exam normal       Cardiovascular hypertension, Pt. on medications + Peripheral Vascular Disease Rhythm:Regular Rate:Normal     Neuro/Psych    GI/Hepatic   Endo/Other    Renal/GU      Musculoskeletal   Abdominal   Peds  Hematology   Anesthesia Other Findings   Reproductive/Obstetrics                           Anesthesia Physical Anesthesia Plan  ASA: II  Anesthesia Plan: Spinal   Post-op Pain Management:    Induction: Intravenous  Airway Management Planned:   Additional Equipment:   Intra-op Plan:   Post-operative Plan:   Informed Consent: I have reviewed the patients History and Physical, chart, labs and discussed the procedure including the risks, benefits and alternatives for the proposed anesthesia with the patient or authorized representative who has indicated his/her understanding and acceptance.     Plan Discussed with: CRNA and Surgeon  Anesthesia Plan Comments:        Anesthesia Quick Evaluation

## 2014-05-03 ENCOUNTER — Encounter (HOSPITAL_COMMUNITY): Payer: Self-pay | Admitting: Orthopedic Surgery

## 2014-05-03 LAB — CBC
HCT: 35 % — ABNORMAL LOW (ref 36.0–46.0)
HEMOGLOBIN: 11.4 g/dL — AB (ref 12.0–15.0)
MCH: 29.2 pg (ref 26.0–34.0)
MCHC: 32.6 g/dL (ref 30.0–36.0)
MCV: 89.7 fL (ref 78.0–100.0)
Platelets: 209 10*3/uL (ref 150–400)
RBC: 3.9 MIL/uL (ref 3.87–5.11)
RDW: 13.9 % (ref 11.5–15.5)
WBC: 9.7 10*3/uL (ref 4.0–10.5)

## 2014-05-03 LAB — BASIC METABOLIC PANEL
Anion gap: 6 (ref 5–15)
BUN: 14 mg/dL (ref 6–23)
CO2: 24 mmol/L (ref 19–32)
Calcium: 8.5 mg/dL (ref 8.4–10.5)
Chloride: 106 mmol/L (ref 96–112)
Creatinine, Ser: 0.6 mg/dL (ref 0.50–1.10)
GFR, EST NON AFRICAN AMERICAN: 83 mL/min — AB (ref >90)
GLUCOSE: 188 mg/dL — AB (ref 70–99)
POTASSIUM: 4 mmol/L (ref 3.5–5.1)
SODIUM: 136 mmol/L (ref 135–145)

## 2014-05-03 MED ORDER — ACETAMINOPHEN 500 MG PO TABS
1000.0000 mg | ORAL_TABLET | Freq: Four times a day (QID) | ORAL | Status: AC
  Administered 2014-05-03: 500 mg via ORAL
  Administered 2014-05-03 (×2): 1000 mg via ORAL
  Filled 2014-05-03 (×2): qty 2

## 2014-05-03 MED ORDER — DILTIAZEM HCL ER BEADS 240 MG PO CP24
240.0000 mg | ORAL_CAPSULE | Freq: Every morning | ORAL | Status: DC
  Administered 2014-05-03 – 2014-05-04 (×2): 240 mg via ORAL
  Filled 2014-05-03 (×2): qty 1

## 2014-05-03 NOTE — Evaluation (Signed)
Occupational Therapy Evaluation Patient Details Name: Donna Callahan MRN: [id number] DOB: 1930-06-17 Today's Date: 05/03/2014    History of Present Illness Pt is s/p R TKA   Clinical Impression   Pt up to recliner with walker with min assist. Her daughter was present for education. Will follow to progress ADL independence for d/c home with daughter.    Follow Up Recommendations  No OT follow up;Supervision/Assistance - 24 hour    Equipment Recommendations  None recommended by OT    Recommendations for Other Services       Precautions / Restrictions Precautions Precautions: Knee Restrictions Weight Bearing Restrictions: No Other Position/Activity Restrictions: WBAT      Mobility Bed Mobility Overal bed mobility: Needs Assistance Bed Mobility: Supine to Sit     Supine to sit: Min guard        Transfers Overall transfer level: Needs assistance Equipment used: Rolling walker (2 wheeled) Transfers: Sit to/from Stand Sit to Stand: Min assist         General transfer comment: verbal cues for hand placement.    Balance                                            ADL Overall ADL's : Needs assistance/impaired Eating/Feeding: Independent;Sitting   Grooming: Wash/dry hands;Set up;Sitting   Upper Body Bathing: Set up;Sitting   Lower Body Bathing: Moderate assistance;Sit to/from stand   Upper Body Dressing : Set up;Sitting   Lower Body Dressing: Moderate assistance;Sit to/from stand   Toilet Transfer: Minimal assistance;Stand-pivot;RW   Toileting- Clothing Manipulation and Hygiene: Moderate assistance;Sit to/from stand         General ADL Comments: Pt able to reach down to top of R foot but not yet able to doff sock. Pt tolerated pivot up to recliner with walker well. Daughter present.      Vision     Perception     Praxis      Pertinent Vitals/Pain Pain Assessment: 0-10 Pain Score: 3  Pain Location: R knee Pain  Descriptors / Indicators: Aching Pain Intervention(s): Repositioned;Ice applied     Hand Dominance Right   Extremity/Trunk Assessment Upper Extremity Assessment Upper Extremity Assessment: Overall WFL for tasks assessed           Communication Communication Communication: No difficulties   Cognition Arousal/Alertness: Awake/alert Behavior During Therapy: WFL for tasks assessed/performed Overall Cognitive Status: Within Functional Limits for tasks assessed                     General Comments       Exercises       Shoulder Instructions      Home Living Family/patient expects to be discharged to:: Private residence     Type of Home: House Home Access: Stairs to enter Secretary/administratorntrance Stairs-Number of Steps: 4 Entrance Stairs-Rails: None Home Layout: Able to live on main level with bedroom/bathroom     Bathroom Shower/Tub: Producer, television/film/videoWalk-in shower   Bathroom Toilet: Handicapped height     Home Equipment: Bedside commode;Walker - 2 wheels;Shower seat - built in          Prior Functioning/Environment Level of Independence: Independent             OT Diagnosis: Generalized weakness   OT Problem List: Decreased strength;Decreased knowledge of use of DME or AE   OT Treatment/Interventions: Self-care/ADL training;Patient/family education;Therapeutic activities;DME  and/or AE instruction    OT Goals(Current goals can be found in the care plan section) Acute Rehab OT Goals Patient Stated Goal: return to independence. OT Goal Formulation: With patient/family Time For Goal Achievement: 05/10/14 Potential to Achieve Goals: Good  OT Frequency: Min 2X/week   Barriers to D/C:            Co-evaluation              End of Session Equipment Utilized During Treatment: Gait belt;Rolling walker;Right knee immobilizer  Activity Tolerance: Patient tolerated treatment well Patient left: in chair;with call bell/phone within reach   Time: 1610-9604 OT Time  Calculation (min): 24 min Charges:  OT General Charges $OT Visit: 1 Procedure OT Evaluation $Initial OT Evaluation Tier I: 1 Procedure OT Treatments $Therapeutic Activity: 8-22 mins G-Codes:    Lennox Laity  [phone] 05/03/2014, 10:26 AM

## 2014-05-03 NOTE — Evaluation (Signed)
Physical Therapy Evaluation Patient Details Name: Donna FermoCarolyn C Mindel MRN: [id number] DOB: 02-Sep-1930 Today's Date: 05/03/2014   History of Present Illness  81 yo female s/p R TKA 05/02/14  Clinical Impression  On eval, pt required Min assist for mobility-able to ambulate ~75 feet with RW. Pt tolerated activity well. Plan is for d/c home tomorrow.     Follow Up Recommendations Home health PT    Equipment Recommendations       Recommendations for Other Services       Precautions / Restrictions Precautions Precautions: Knee Restrictions Weight Bearing Restrictions: No Other Position/Activity Restrictions: WBAT      Mobility  Bed Mobility Overal bed mobility: Needs Assistance Bed Mobility: Supine to Sit     Supine to sit: Min guard     General bed mobility comments: oob in recliner  Transfers Overall transfer level: Needs assistance Equipment used: Rolling walker (2 wheeled) Transfers: Sit to/from Stand Sit to Stand: Min assist         General transfer comment: Assist to stabilize. VCS safety, hand/LE placement  Ambulation/Gait Ambulation/Gait assistance: Min assist Ambulation Distance (Feet): 75 Feet Assistive device: Rolling walker (2 wheeled) Gait Pattern/deviations: Step-to pattern;Antalgic     General Gait Details: VCS safety, technique, seqeuence. assist to stabilize intermittently  Stairs            Wheelchair Mobility    Modified Rankin (Stroke Patients Only)       Balance                                             Pertinent Vitals/Pain Pain Assessment: 0-10 Pain Score: 3  Pain Location: R knee Pain Descriptors / Indicators: Aching;Sore Pain Intervention(s): Monitored during session;Ice applied    Home Living Family/patient expects to be discharged to:: Private residence     Type of Home: House Home Access: Stairs to enter Entrance Stairs-Rails: None Entrance Stairs-Number of Steps: 2+1 Home Layout: Able to  live on main level with bedroom/bathroom Home Equipment: Bedside commode;Walker - 2 wheels;Shower seat - built in      Prior Function Level of Independence: Independent               Hand Dominance   Dominant Hand: Right    Extremity/Trunk Assessment   Upper Extremity Assessment: Defer to OT evaluation           Lower Extremity Assessment: RLE deficits/detail RLE Deficits / Details: hip flex 3-/5, moves ankle well    Cervical / Trunk Assessment: Normal  Communication   Communication: No difficulties  Cognition Arousal/Alertness: Awake/alert Behavior During Therapy: WFL for tasks assessed/performed Overall Cognitive Status: Within Functional Limits for tasks assessed                      General Comments      Exercises Total Joint Exercises Ankle Circles/Pumps: AROM;Both;10 reps;Supine Quad Sets: AROM;Both;10 reps;Supine Heel Slides: AAROM;Right;10 reps;Supine Hip ABduction/ADduction: AAROM;Right;10 reps;Supine Straight Leg Raises: AAROM;Right;10 reps;Supine Goniometric ROM: ~10-60 degrees      Assessment/Plan    PT Assessment Patient needs continued PT services  PT Diagnosis Difficulty walking;Acute pain   PT Problem List Decreased strength;Decreased range of motion;Decreased activity tolerance;Decreased balance;Decreased mobility;Decreased knowledge of use of DME;Pain  PT Treatment Interventions DME instruction;Gait training;Stair training;Functional mobility training;Therapeutic activities;Therapeutic exercise;Patient/family education;Balance training   PT Goals (Current goals can be found  in the Care Plan section) Acute Rehab PT Goals Patient Stated Goal: return to independence. PT Goal Formulation: With patient Time For Goal Achievement: 05/10/14 Potential to Achieve Goals: Good    Frequency 7X/week   Barriers to discharge        Co-evaluation               End of Session Equipment Utilized During Treatment: Gait  belt Activity Tolerance: Patient tolerated treatment well Patient left: with call bell/phone within reach;with family/visitor present           Time: 1131-1147 PT Time Calculation (min) (ACUTE ONLY): 16 min   Charges:   PT Evaluation $Initial PT Evaluation Tier I: 1 Procedure     PT G Codes:        Rebeca Alert, MPT Pager: [phone]

## 2014-05-03 NOTE — Discharge Instructions (Addendum)
Dr. Gaynelle Arabian Total Joint Specialist [facility] [address] [place], [place] [address] [phone]  TOTAL KNEE REPLACEMENT POSTOPERATIVE DIRECTIONS  Knee Rehabilitation, Guidelines Following Surgery  Results after knee surgery are often greatly improved when you follow the exercise, range of motion and muscle strengthening exercises prescribed by your doctor. Safety measures are also important to protect the knee from further injury. Any time any of these exercises cause you to have increased pain or swelling in your knee joint, decrease the amount until you are comfortable again and slowly increase them. If you have problems or questions, call your caregiver or physical therapist for advice.   HOME CARE INSTRUCTIONS  Remove items at home which could result in a fall. This includes throw rugs or furniture in walking pathways.   ICE to the affected knee every three hours for 30 minutes at a time and then as needed for pain and swelling.  Continue to use ice on the knee for pain and swelling from surgery. You may notice swelling that will progress down to the foot and ankle.  This is normal after surgery.  Elevate the leg when you are not up walking on it.    Continue to use the breathing machine which will help keep your temperature down.  It is common for your temperature to cycle up and down following surgery, especially at night when you are not up moving around and exerting yourself.  The breathing machine keeps your lungs expanded and your temperature down.  Do not place pillow under knee, focus on keeping the knee straight while resting  DIET You may resume your previous home diet once your are discharged from the hospital.  DRESSING / WOUND CARE / SHOWERING You may change your dressing 3-5 days after surgery.  Then change the dressing every day with sterile gauze.  Please use good hand washing techniques before changing the dressing.  Do not use any  lotions or creams on the incision until instructed by your surgeon. You may start showering once you are discharged home but do not submerge the incision under water. Just pat the incision dry and apply a dry gauze dressing on daily. Change the surgical dressing daily and reapply a dry dressing each time.  ACTIVITY Walk with your walker as instructed. Use walker as long as suggested by your caregivers. Avoid periods of inactivity such as sitting longer than an hour when not asleep. This helps prevent blood clots.  You may resume a sexual relationship in one month or when given the OK by your doctor.  You may return to work once you are cleared by your doctor.  Do not drive a car for 6 weeks or until released by you surgeon.  Do not drive while taking narcotics.  WEIGHT BEARING Weight bearing as tolerated with assist device (walker, cane, etc) as directed, use it as long as suggested by your surgeon or therapist, typically at least 4-6 weeks.  POSTOPERATIVE CONSTIPATION PROTOCOL Constipation - defined medically as fewer than three stools per week and severe constipation as less than one stool per week.  One of the most common issues patients have following surgery is constipation.  Even if you have a regular bowel pattern at home, your normal regimen is likely to be disrupted due to multiple reasons following surgery.  Combination of anesthesia, postoperative narcotics, change in appetite and fluid intake all can affect your bowels.  In order to avoid complications following surgery, here are some recommendations  in order to help you during your recovery period. ° °Colace (docusate) - Pick up an over-the-counter form of Colace or another stool softener and take twice a day as long as you are requiring postoperative pain medications.  Take with a full glass of water daily.  If you experience loose stools or diarrhea, hold the colace until you stool forms back up.  If your symptoms do not get better  within 1 week or if they get worse, check with your doctor. ° °Dulcolax (bisacodyl) - Pick up over-the-counter and take as directed by the product packaging as needed to assist with the movement of your bowels.  Take with a full glass of water.  Use this product as needed if not relieved by Colace only.  ° °MiraLax (polyethylene glycol) - Pick up over-the-counter to have on hand.  MiraLax is a solution that will increase the amount of water in your bowels to assist with bowel movements.  Take as directed and can mix with a glass of water, juice, soda, coffee, or tea.  Take if you go more than two days without a movement. °Do not use MiraLax more than once per day. Call your doctor if you are still constipated or irregular after using this medication for 7 days in a row. ° °If you continue to have problems with postoperative constipation, please contact the office for further assistance and recommendations.  If you experience "the worst abdominal pain ever" or develop nausea or vomiting, please contact the office immediatly for further recommendations for treatment. ° °ITCHING ° If you experience itching with your medications, try taking only a single pain pill, or even half a pain pill at a time.  You can also use Benadryl over the counter for itching or also to help with sleep.  ° °TED HOSE STOCKINGS °Wear the elastic stockings on both legs for three weeks following surgery during the day but you may remove then at night for sleeping. ° °MEDICATIONS °See your medication summary on the “After Visit Summary” that the nursing staff will review with you prior to discharge.  You may have some home medications which will be placed on hold until you complete the course of blood thinner medication.  It is important for you to complete the blood thinner medication as prescribed by your surgeon.  Continue your approved medications as instructed at time of discharge. ° °PRECAUTIONS °If you experience chest pain or shortness  of breath - call 911 immediately for transfer to the hospital emergency department.  °If you develop a fever greater that 101 F, purulent drainage from wound, increased redness or drainage from wound, foul odor from the wound/dressing, or calf pain - CONTACT YOUR SURGEON.   °                                                °FOLLOW-UP APPOINTMENTS °Make sure you keep all of your appointments after your operation with your surgeon and caregivers. You should call the office at the above phone number and make an appointment for approximately two weeks after the date of your surgery or on the date instructed by your surgeon outlined in the "After Visit Summary". ° ° °RANGE OF MOTION AND STRENGTHENING EXERCISES  °Rehabilitation of the knee is important following a knee injury or an operation. After just a few days of immobilization, the muscles   of the thigh which control the knee become weakened and shrink (atrophy). Knee exercises are designed to build up the tone and strength of the thigh muscles and to improve knee motion. Often times heat used for twenty to thirty minutes before working out will loosen up your tissues and help with improving the range of motion but do not use heat for the first two weeks following surgery. These exercises can be done on a training (exercise) mat, on the floor, on a table or on a bed. Use what ever works the best and is most comfortable for you Knee exercises include:  Leg Lifts - While your knee is still immobilized in a splint or cast, you can do straight leg raises. Lift the leg to 60 degrees, hold for 3 sec, and slowly lower the leg. Repeat 10-20 times 2-3 times daily. Perform this exercise against resistance later as your knee gets better.  Quad and Hamstring Sets - Tighten up the muscle on the front of the thigh (Quad) and hold for 5-10 sec. Repeat this 10-20 times hourly. Hamstring sets are done by pushing the foot backward against an object and holding for 5-10 sec. Repeat as  with quad sets.   Leg Slides: Lying on your back, slowly slide your foot toward your buttocks, bending your knee up off the floor (only go as far as is comfortable). Then slowly slide your foot back down until your leg is flat on the floor again.  Angel Wings: Lying on your back spread your legs to the side as far apart as you can without causing discomfort.  A rehabilitation program following serious knee injuries can speed recovery and prevent re-injury in the future due to weakened muscles. Contact your doctor or a physical therapist for more information on knee rehabilitation.   IF YOU ARE TRANSFERRED TO A SKILLED REHAB FACILITY If the patient is transferred to a skilled rehab facility following release from the hospital, a list of the current medications will be sent to the facility for the patient to continue.  When discharged from the skilled rehab facility, please have the facility set up the patient's Home Health Physical Therapy prior to being released. Also, the skilled facility will be responsible for providing the patient with their medications at time of release from the facility to include their pain medication, the muscle relaxants, and their blood thinner medication. If the patient is still at the rehab facility at time of the two week follow up appointment, the skilled rehab facility will also need to assist the patient in arranging follow up appointment in our office and any transportation needs.  MAKE SURE YOU:  Understand these instructions.  Get help right away if you are not doing well or get worse.    Pick up stool softner and laxative for home use following surgery while on pain medications. Do not submerge incision under water. Please use good hand washing techniques while changing dressing each day. May shower starting three days after surgery. Please use a clean towel to pat the incision dry following showers. Continue to use ice for pain and swelling after  surgery. Do not use any lotions or creams on the incision until instructed by your surgeon.  Take Xarelto for two and a half more weeks, then discontinue Xarelto. Once the patient has completed the Xarelto, they may resume the 81 mg Aspirin.    Information on my medicine - XARELTO (Rivaroxaban)  This medication education was reviewed with me or my healthcare  representative as part of my discharge preparation.  The pharmacist that spoke with me during my hospital stay was:  WOFFORD, DREW A, RPH  Why was Xarelto prescribed for you? Xarelto was prescribed for you to reduce the risk of blood clots forming after orthopedic surgery. The medical term for these abnormal blood clots is venous thromboembolism (VTE).  What do you need to know about xarelto ? Take your Xarelto ONCE DAILY at the same time every day. You may take it either with or without food.  If you have difficulty swallowing the tablet whole, you may crush it and mix in applesauce just prior to taking your dose.  Take Xarelto exactly as prescribed by your doctor and DO NOT stop taking Xarelto without talking to the doctor who prescribed the medication.  Stopping without other VTE prevention medication to take the place of Xarelto may increase your risk of developing a clot.  After discharge, you should have regular check-up appointments with your healthcare provider that is prescribing your Xarelto.    What do you do if you miss a dose? If you miss a dose, take it as soon as you remember on the same day then continue your regularly scheduled once daily regimen the next day. Do not take two doses of Xarelto on the same day.   Important Safety Information A possible side effect of Xarelto is bleeding. You should call your healthcare provider right away if you experience any of the following: ? Bleeding from an injury or your nose that does not stop. ? Unusual colored urine (red or dark Bernabe) or unusual colored stools  (red or black). ? Unusual bruising for unknown reasons. ? A serious fall or if you hit your head (even if there is no bleeding).  Some medicines may interact with Xarelto and might increase your risk of bleeding while on Xarelto. To help avoid this, consult your healthcare provider or pharmacist prior to using any new prescription or non-prescription medications, including herbals, vitamins, non-steroidal anti-inflammatory drugs (NSAIDs) and supplements.  This website has more information on Xarelto: VisitDestination.com.br.

## 2014-05-03 NOTE — Progress Notes (Signed)
Utilization review completed.  

## 2014-05-03 NOTE — Progress Notes (Signed)
   Subjective: 1 Day Post-Op Procedure(s) (LRB): RIGHT TOTAL KNEE ARTHROPLASTY (Right) Patient reports pain as mild.   We will start therapy today.  Plan is to go Home after hospital stay.  Objective: Vital signs in last 24 hours: Temp:  [97.3 F (36.3 C)-98.1 F (36.7 C)] 98.1 F (36.7 C) (04/14 0024) Pulse Rate:  [52-86] 61 (04/14 0024) Resp:  [12-20] 16 (04/14 0024) BP: (114-172)/(63-93) 124/63 mmHg (04/14 0024) SpO2:  [95 %-100 %] 98 % (04/14 0024) Weight:  [56.7 kg (125 lb)] 56.7 kg (125 lb) (04/13 1714)  Intake/Output from previous day:  Intake/Output Summary (Last 24 hours) at 05/03/14 0629 Last data filed at 05/03/14 0046  Gross per 24 hour  Intake   2480 ml  Output   2070 ml  Net    410 ml    Intake/Output this shift: Total I/O In: 1080 [P.O.:480; I.V.:600] Out: 800 [Urine:800]  Labs:  Recent Labs  05/03/14 0510  HGB 11.4*    Recent Labs  05/03/14 0510  WBC 9.7  RBC 3.90  HCT 35.0*  PLT 209    Recent Labs  05/03/14 0510  NA 136  K 4.0  CL 106  CO2 24  BUN 14  CREATININE 0.60  GLUCOSE 188*  CALCIUM 8.5   No results for input(s): LABPT, INR in the last 72 hours.  EXAM General - Patient is Alert, Appropriate and Oriented Extremity - Neurologically intact Neurovascular intact No cellulitis present Compartment soft Dressing - dressing C/D/I Motor Function - intact, moving foot and toes well on exam.  Hemovac pulled without difficulty.  Past Medical History  Diagnosis Date  . Hypertension   . High cholesterol   . Hard of hearing   . Varicose veins     Assessment/Plan: 1 Day Post-Op Procedure(s) (LRB): RIGHT TOTAL KNEE ARTHROPLASTY (Right) Principal Problem:   OA (osteoarthritis) of knee   Advance diet Up with therapy D/C IV fluids Plan for discharge tomorrow  DVT Prophylaxis - Xarelto Weight-Bearing as tolerated to right leg Keep foley until tomorrow. No vaccines. D/C PCA, Change to IV push D/C O2 and Pulse OX and  try on Room Air  Stephan Draughn V 05/03/2014, 6:29 AM

## 2014-05-03 NOTE — Progress Notes (Signed)
Physical Therapy Treatment Patient Details Name: Donna FermoCarolyn C Pellman MRN: [id number] DOB: 02/03/1930 Today's Date: 05/03/2014    History of Present Illness 81 yo female s/p R TKA 05/02/14    PT Comments    Progressing with mobility. Will plan to practice steps on tomorrow.   Follow Up Recommendations  Home health PT     Equipment Recommendations  None recommended by PT    Recommendations for Other Services       Precautions / Restrictions Precautions Precautions: Knee Restrictions Weight Bearing Restrictions: No Other Position/Activity Restrictions: WBAT    Mobility  Bed Mobility Overal bed mobility: Needs Assistance Bed Mobility: Sit to Supine       Sit to supine: Min assist   General bed mobility comments: assist for R LE onto bed.   Transfers Overall transfer level: Needs assistance Equipment used: Rolling walker (2 wheeled) Transfers: Sit to/from Stand Sit to Stand: Min guard         General transfer comment:  VCS safety, hand/LE placement. close guard for safety  Ambulation/Gait Ambulation/Gait assistance: Min guard Ambulation Distance (Feet): 85 Feet Assistive device: Rolling walker (2 wheeled) Gait Pattern/deviations: Step-to pattern;Antalgic     General Gait Details: close guard for safety.    Stairs            Wheelchair Mobility    Modified Rankin (Stroke Patients Only)       Balance                                    Cognition Arousal/Alertness: Awake/alert Behavior During Therapy: WFL for tasks assessed/performed Overall Cognitive Status: Within Functional Limits for tasks assessed                      Exercises    General Comments        Pertinent Vitals/Pain Pain Assessment: 0-10 Pain Score: 5  Pain Location: R knee Pain Descriptors / Indicators: Aching;Sore Pain Intervention(s): Monitored during session;Ice applied;Repositioned    Home Living           Entrance Stairs-Rails: None           Prior Function            PT Goals (current goals can now be found in the care plan section) Acute Rehab PT Goals Patient Stated Goal: return to independence. PT Goal Formulation: With patient Time For Goal Achievement: 05/10/14 Potential to Achieve Goals: Good Progress towards PT goals: Progressing toward goals    Frequency  7X/week    PT Plan Current plan remains appropriate    Co-evaluation             End of Session Equipment Utilized During Treatment: Gait belt Activity Tolerance: Patient tolerated treatment well Patient left: in bed;with call bell/phone within reach     Time: [id number] PT Time Calculation (min) (ACUTE ONLY): 10 min  Charges:  $Gait Training: 8-22 mins                    G Codes:      Rebeca AlertJannie Shaila Gilchrest, MPT Pager: [phone]

## 2014-05-04 LAB — BASIC METABOLIC PANEL
Anion gap: 6 (ref 5–15)
BUN: 14 mg/dL (ref 6–23)
CALCIUM: 8.6 mg/dL (ref 8.4–10.5)
CO2: 28 mmol/L (ref 19–32)
CREATININE: 0.71 mg/dL (ref 0.50–1.10)
Chloride: 102 mmol/L (ref 96–112)
GFR calc Af Amer: >90 mL/min (ref >90)
GFR, EST NON AFRICAN AMERICAN: 79 mL/min — AB (ref >90)
GLUCOSE: 116 mg/dL — AB (ref 70–99)
Potassium: 4 mmol/L (ref 3.5–5.1)
Sodium: 136 mmol/L (ref 135–145)

## 2014-05-04 LAB — CBC
HCT: 33.7 % — ABNORMAL LOW (ref 36.0–46.0)
HEMOGLOBIN: 11.1 g/dL — AB (ref 12.0–15.0)
MCH: 29.7 pg (ref 26.0–34.0)
MCHC: 32.9 g/dL (ref 30.0–36.0)
MCV: 90.1 fL (ref 78.0–100.0)
Platelets: 221 10*3/uL (ref 150–400)
RBC: 3.74 MIL/uL — ABNORMAL LOW (ref 3.87–5.11)
RDW: 14.4 % (ref 11.5–15.5)
WBC: 9.9 10*3/uL (ref 4.0–10.5)

## 2014-05-04 MED ORDER — METHOCARBAMOL 500 MG PO TABS: 500.0000 mg | ORAL_TABLET | Freq: Four times a day (QID) | ORAL | Status: DC | PRN

## 2014-05-04 MED ORDER — RIVAROXABAN 10 MG PO TABS: 10.0000 mg | ORAL_TABLET | Freq: Every day | ORAL | Status: DC

## 2014-05-04 MED ORDER — OXYCODONE HCL 5 MG PO TABS: 5.0000 mg | ORAL_TABLET | ORAL | Status: DC | PRN

## 2014-05-04 MED ORDER — TRAMADOL HCL 50 MG PO TABS: 50.0000 mg | ORAL_TABLET | Freq: Four times a day (QID) | ORAL | Status: DC | PRN

## 2014-05-04 NOTE — Discharge Summary (Signed)
Physician Discharge Summary   Patient ID: Donna Callahan MRN: [id number] DOB/AGE: 1930-06-05 82 y.o.  Admit date: 05/02/2014 Discharge date: 05-04-2014  Primary Diagnosis:  Osteoarthritis Right knee(s)  Admission Diagnoses:  Past Medical History  Diagnosis Date  . Hypertension   . High cholesterol   . Hard of hearing   . Varicose veins    Discharge Diagnoses:   Principal Problem:   OA (osteoarthritis) of knee  Estimated body mass index is 21.45 kg/(m^2) as calculated from the following:   Height as of this encounter: $RemoveBeforeD'5\' 4"'RnVyiEHspOutGO$  (1.626 m).   Weight as of this encounter: 56.7 kg (125 lb).  Procedure:  Procedure(s) (LRB): RIGHT TOTAL KNEE ARTHROPLASTY (Right)   Consults: None  HPI: Donna Callahan is a 81 y.o. year old female with end stage OA of her right knee with progressively worsening pain and dysfunction. She has constant pain, with activity and at rest and significant functional deficits with difficulties even with ADLs. She has had extensive non-op management including analgesics, injections of cortisone and viscosupplements, and home exercise program, but remains in significant pain with significant dysfunction.Radiographs show bone on bone arthritis lateral and patellofemoral. She presents now for right Total Knee Arthroplasty.   Laboratory Data: Admission on 05/02/2014  Component Date Value Ref Range Status  . WBC 05/03/2014 9.7  4.0 - 10.5 K/uL Final  . RBC 05/03/2014 3.90  3.87 - 5.11 MIL/uL Final  . Hemoglobin 05/03/2014 11.4* 12.0 - 15.0 g/dL Final  . HCT 05/03/2014 35.0* 36.0 - 46.0 % Final  . MCV 05/03/2014 89.7  78.0 - 100.0 fL Final  . MCH 05/03/2014 29.2  26.0 - 34.0 pg Final  . MCHC 05/03/2014 32.6  30.0 - 36.0 g/dL Final  . RDW 05/03/2014 13.9  11.5 - 15.5 % Final  . Platelets 05/03/2014 209  150 - 400 K/uL Final  . Sodium 05/03/2014 136  135 - 145 mmol/L Final  . Potassium 05/03/2014 4.0  3.5 - 5.1 mmol/L Final  . Chloride 05/03/2014 106  96 - 112  mmol/L Final  . CO2 05/03/2014 24  19 - 32 mmol/L Final  . Glucose, Bld 05/03/2014 188* 70 - 99 mg/dL Final  . BUN 05/03/2014 14  6 - 23 mg/dL Final  . Creatinine, Ser 05/03/2014 0.60  0.50 - 1.10 mg/dL Final  . Calcium 05/03/2014 8.5  8.4 - 10.5 mg/dL Final  . GFR calc non Af Amer 05/03/2014 83* >90 mL/min Final  . GFR calc Af Amer 05/03/2014 >90  >90 mL/min Final   Comment: (NOTE) The eGFR has been calculated using the CKD EPI equation. This calculation has not been validated in all clinical situations. eGFR's persistently <90 mL/min signify possible Chronic Kidney Disease.   . Anion gap 05/03/2014 6  5 - 15 Final  . WBC 05/04/2014 9.9  4.0 - 10.5 K/uL Final  . RBC 05/04/2014 3.74* 3.87 - 5.11 MIL/uL Final  . Hemoglobin 05/04/2014 11.1* 12.0 - 15.0 g/dL Final  . HCT 05/04/2014 33.7* 36.0 - 46.0 % Final  . MCV 05/04/2014 90.1  78.0 - 100.0 fL Final  . MCH 05/04/2014 29.7  26.0 - 34.0 pg Final  . MCHC 05/04/2014 32.9  30.0 - 36.0 g/dL Final  . RDW 05/04/2014 14.4  11.5 - 15.5 % Final  . Platelets 05/04/2014 221  150 - 400 K/uL Final  . Sodium 05/04/2014 136  135 - 145 mmol/L Final  . Potassium 05/04/2014 4.0  3.5 - 5.1 mmol/L Final  . Chloride 05/04/2014  102  96 - 112 mmol/L Final  . CO2 05/04/2014 28  19 - 32 mmol/L Final  . Glucose, Bld 05/04/2014 116* 70 - 99 mg/dL Final  . BUN 79/40/1899 14  6 - 23 mg/dL Final  . Creatinine, Ser 05/04/2014 0.71  0.50 - 1.10 mg/dL Final  . Calcium 21/23/5626 8.6  8.4 - 10.5 mg/dL Final  . GFR calc non Af Amer 05/04/2014 79* >90 mL/min Final  . GFR calc Af Amer 05/04/2014 >90  >90 mL/min Final   Comment: (NOTE) The eGFR has been calculated using the CKD EPI equation. This calculation has not been validated in all clinical situations. eGFR's persistently <90 mL/min signify possible Chronic Kidney Disease.   Eustaquio Boyden gap 05/04/2014 6  5 - 15 Final  Hospital Outpatient Visit on 04/25/2014  Component Date Value Ref Range Status  . aPTT  04/25/2014 30  24 - 37 seconds Final  . WBC 04/25/2014 7.1  4.0 - 10.5 K/uL Final  . RBC 04/25/2014 4.66  3.87 - 5.11 MIL/uL Final  . Hemoglobin 04/25/2014 13.6  12.0 - 15.0 g/dL Final  . HCT 85/39/5920 41.9  36.0 - 46.0 % Final  . MCV 04/25/2014 89.9  78.0 - 100.0 fL Final  . MCH 04/25/2014 29.2  26.0 - 34.0 pg Final  . MCHC 04/25/2014 32.5  30.0 - 36.0 g/dL Final  . RDW 64/63/1808 14.0  11.5 - 15.5 % Final  . Platelets 04/25/2014 274  150 - 400 K/uL Final  . Sodium 04/25/2014 140  135 - 145 mmol/L Final  . Potassium 04/25/2014 4.3  3.5 - 5.1 mmol/L Final  . Chloride 04/25/2014 102  96 - 112 mmol/L Final  . CO2 04/25/2014 27  19 - 32 mmol/L Final  . Glucose, Bld 04/25/2014 85  70 - 99 mg/dL Final  . BUN 13/84/0201 15  6 - 23 mg/dL Final  . Creatinine, Ser 04/25/2014 0.71  0.50 - 1.10 mg/dL Final  . Calcium 14/66/1982 9.5  8.4 - 10.5 mg/dL Final  . Total Protein 04/25/2014 7.7  6.0 - 8.3 g/dL Final  . Albumin 04/58/0019 4.7  3.5 - 5.2 g/dL Final  . AST 08/34/4932 29  0 - 37 U/L Final  . ALT 04/25/2014 30  0 - 35 U/L Final  . Alkaline Phosphatase 04/25/2014 77  39 - 117 U/L Final  . Total Bilirubin 04/25/2014 0.6  0.3 - 1.2 mg/dL Final  . GFR calc non Af Amer 04/25/2014 79* >90 mL/min Final  . GFR calc Af Amer 04/25/2014 >90  >90 mL/min Final   Comment: (NOTE) The eGFR has been calculated using the CKD EPI equation. This calculation has not been validated in all clinical situations. eGFR's persistently <90 mL/min signify possible Chronic Kidney Disease.   . Anion gap 04/25/2014 11  5 - 15 Final  . Prothrombin Time 04/25/2014 13.2  11.6 - 15.2 seconds Final  . INR 04/25/2014 0.99  0.00 - 1.49 Final  . ABO/RH(D) 04/25/2014 B POS   Final  . Antibody Screen 04/25/2014 NEG   Final  . Sample Expiration 04/25/2014 05/05/2014   Final  . Color, Urine 04/25/2014 YELLOW  YELLOW Final  . APPearance 04/25/2014 CLEAR  CLEAR Final  . Specific Gravity, Urine 04/25/2014 1.006  1.005 - 1.030  Final  . pH 04/25/2014 6.5  5.0 - 8.0 Final  . Glucose, UA 04/25/2014 NEGATIVE  NEGATIVE mg/dL Final  . Hgb urine dipstick 04/25/2014 NEGATIVE  NEGATIVE Final  . Bilirubin Urine 04/25/2014 NEGATIVE  NEGATIVE Final  . Ketones, ur 04/25/2014 NEGATIVE  NEGATIVE mg/dL Final  . Protein, ur 04/25/2014 NEGATIVE  NEGATIVE mg/dL Final  . Urobilinogen, UA 04/25/2014 0.2  0.0 - 1.0 mg/dL Final  . Nitrite 04/25/2014 NEGATIVE  NEGATIVE Final  . Leukocytes, UA 04/25/2014 NEGATIVE  NEGATIVE Final   MICROSCOPIC NOT DONE ON URINES WITH NEGATIVE PROTEIN, BLOOD, LEUKOCYTES, NITRITE, OR GLUCOSE <1000 mg/dL.  Marland Kitchen MRSA, PCR 04/25/2014 NEGATIVE  NEGATIVE Final  . Staphylococcus aureus 04/25/2014 NEGATIVE  NEGATIVE Final   Comment:        The Xpert SA Assay (FDA approved for NASAL specimens in patients over 78 years of age), is one component of a comprehensive surveillance program.  Test performance has been validated by [facility] for patients greater than or equal to 74 year old. It is not intended to diagnose infection nor to guide or monitor treatment.   . ABO/RH(D) 04/25/2014 B POS   Final     X-Rays:Us Venous Img Lower Unilateral Right  04/25/2014   CLINICAL DATA:  Six-month status post right GSV transcatheter laser occlusion for varicose veins, venous insufficiency, leg pain  EXAM: RIGHT LOWER EXTREMITY VENOUS DOPPLER ULTRASOUND  TECHNIQUE: Gray-scale sonography with graded compression, as well as color Doppler and duplex ultrasound were performed to evaluate the lower extremity deep venous systems from the level of the common femoral vein and including the common femoral, femoral, profunda femoral, popliteal and calf veins including the posterior tibial, peroneal and gastrocnemius veins when visible. The superficial great saphenous vein was also interrogated. Spectral Doppler was utilized to evaluate flow at rest and with distal augmentation maneuvers in the common femoral, femoral and popliteal veins.   COMPARISON:  None.  FINDINGS: Contralateral Common Femoral Vein: Respiratory phasicity is normal and symmetric with the symptomatic side. No evidence of thrombus. Normal compressibility.  Common Femoral Vein: No evidence of thrombus. Normal compressibility, respiratory phasicity and response to augmentation.  Saphenofemoral Junction: No evidence of thrombus. Normal compressibility and flow on color Doppler imaging.  Profunda Femoral Vein: No evidence of thrombus. Normal compressibility and flow on color Doppler imaging.  Femoral Vein: No evidence of thrombus. Normal compressibility, respiratory phasicity and response to augmentation.  Popliteal Vein: No evidence of thrombus. Normal compressibility, respiratory phasicity and response to augmentation.  Calf Veins: No evidence of thrombus. Normal compressibility and flow on color Doppler imaging.  Superficial Great Saphenous Vein: Right GSV treated segment has retracted. GSV treated segment remains occluded from the saphenous femoral junction into the proximal calf. Minimal areas of short segment recanalization throughout the thigh region. Small varicosities persist across the knee and calf region.  Venous Reflux:  None.  Other Findings:  None.  IMPRESSION: Negative for DVT.  Right GSV treated segment demonstrates interval retraction and scarring. Minimal intermittent recanalization in the thigh region.  Persistent knee and anterior tibial varicosities.   Electronically Signed   By: Jerilynn Mages.  Shick M.D.   On: 04/25/2014 15:05    EKG: Orders placed or performed during the hospital encounter of 04/25/14  . EKG 12-Lead  . EKG 12-Lead     Hospital Course: Donna Callahan is a 81 y.o. who was admitted to [facility]. They were brought to the operating room on 05/02/2014 and underwent Procedure(s): RIGHT TOTAL KNEE ARTHROPLASTY.  Patient tolerated the procedure well and was later transferred to the recovery room and then to the orthopaedic floor for postoperative  care.  They were given PO and IV analgesics for pain control following their  surgery.  They were given 24 hours of postoperative antibiotics of  Anti-infectives    Start     Dose/Rate Route Frequency Ordered Stop   05/03/14 0600  vancomycin (VANCOCIN) IVPB 1000 mg/200 mL premix     1,000 mg 200 mL/hr over 60 Minutes Intravenous On call to O.R. 05/02/14 1200 05/02/14 1342   05/03/14 0000  vancomycin (VANCOCIN) IVPB 1000 mg/200 mL premix     1,000 mg 200 mL/hr over 60 Minutes Intravenous Every 12 hours 05/02/14 1808 05/03/14 1247     and started on DVT prophylaxis in the form of Xarelto.   PT and OT were ordered for total joint protocol.  Discharge planning consulted to help with postop disposition and equipment needs.  Patient had a tough night on the evening of surgery.  They started to get up OOB with therapy on day one. Hemovac drain was pulled without difficulty.  Continued to work with therapy into day two.  Dressing was changed on day two and the incision was healing well.  Patient was seen in rounds and felt was ready to go home following therapy.  Discharge home with home health Diet - Cardiac diet Follow up - in 2 weeks Activity - WBAT Disposition - Home Condition Upon Discharge - Good D/C Meds - See DC Summary DVT Prophylaxis - Xarelto  Discharge Instructions    Call MD / Call 911    Complete by:  As directed   If you experience chest pain or shortness of breath, CALL 911 and be transported to the hospital emergency room.  If you develope a fever above 101 F, pus (white drainage) or increased drainage or redness at the wound, or calf pain, call your surgeon's office.     Change dressing    Complete by:  As directed   Change dressing daily with sterile 4 x 4 inch gauze dressing and apply TED hose. Do not submerge the incision under water.     Constipation Prevention    Complete by:  As directed   Drink plenty of fluids.  Prune juice may be helpful.  You may use a stool softener,  such as Colace (over the counter) 100 mg twice a day.  Use MiraLax (over the counter) for constipation as needed.     Diet - low sodium heart healthy    Complete by:  As directed      Discharge instructions    Complete by:  As directed   Pick up stool softner and laxative for home use following surgery while on pain medications. Do not submerge incision under water. Please use good hand washing techniques while changing dressing each day. May shower starting three days after surgery. Please use a clean towel to pat the incision dry following showers. Continue to use ice for pain and swelling after surgery. Do not use any lotions or creams on the incision until instructed by your surgeon.  Take Xarelto for two and a half more weeks, then discontinue Xarelto. Once the patient has completed the Xarelto, they may resume the 81 mg Aspirin.  Postoperative Constipation Protocol  Constipation - defined medically as fewer than three stools per week and severe constipation as less than one stool per week.  One of the most common issues patients have following surgery is constipation.  Even if you have a regular bowel pattern at home, your normal regimen is likely to be disrupted due to multiple reasons following surgery.  Combination of anesthesia, postoperative narcotics, change in appetite  and fluid intake all can affect your bowels.  In order to avoid complications following surgery, here are some recommendations in order to help you during your recovery period.  Colace (docusate) - Pick up an over-the-counter form of Colace or another stool softener and take twice a day as long as you are requiring postoperative pain medications.  Take with a full glass of water daily.  If you experience loose stools or diarrhea, hold the colace until you stool forms back up.  If your symptoms do not get better within 1 week or if they get worse, check with your doctor.  Dulcolax (bisacodyl) - Pick up  over-the-counter and take as directed by the product packaging as needed to assist with the movement of your bowels.  Take with a full glass of water.  Use this product as needed if not relieved by Colace only.   MiraLax (polyethylene glycol) - Pick up over-the-counter to have on hand.  MiraLax is a solution that will increase the amount of water in your bowels to assist with bowel movements.  Take as directed and can mix with a glass of water, juice, soda, coffee, or tea.  Take if you go more than two days without a movement. Do not use MiraLax more than once per day. Call your doctor if you are still constipated or irregular after using this medication for 7 days in a row.  If you continue to have problems with postoperative constipation, please contact the office for further assistance and recommendations.  If you experience "the worst abdominal pain ever" or develop nausea or vomiting, please contact the office immediatly for further recommendations for treatment.     Do not put a pillow under the knee. Place it under the heel.    Complete by:  As directed      Do not sit on low chairs, stoools or toilet seats, as it may be difficult to get up from low surfaces    Complete by:  As directed      Driving restrictions    Complete by:  As directed   No driving until released by the physician.     Increase activity slowly as tolerated    Complete by:  As directed      Lifting restrictions    Complete by:  As directed   No lifting until released by the physician.     Patient may shower    Complete by:  As directed   You may shower without a dressing once there is no drainage.  Do not wash over the wound.  If drainage remains, do not shower until drainage stops.     TED hose    Complete by:  As directed   Use stockings (TED hose) for 3 weeks on both leg(s).  You may remove them at night for sleeping.     Weight bearing as tolerated    Complete by:  As directed   Laterality:  right  Extremity:   Lower            Medication List    STOP taking these medications        CALCIUM-VITAMIN D PO     FISH OIL PO     multivitamin with minerals Tabs tablet     PredniSONE 5 MG Kit      TAKE these medications        acetaminophen 500 MG tablet  Commonly known as:  TYLENOL  Take 500-1,000 mg by mouth every  morning.     aspirin 81 MG tablet  Take 81 mg by mouth daily at 12 noon.     diltiazem 240 MG 24 hr capsule  Commonly known as:  TIAZAC  Take 240 mg by mouth every morning.     irbesartan 150 MG tablet  Commonly known as:  AVAPRO  Take 150 mg by mouth at bedtime.     loperamide 2 MG capsule  Commonly known as:  IMODIUM  Take by mouth as needed for diarrhea or loose stools.     methocarbamol 500 MG tablet  Commonly known as:  ROBAXIN  Take 1 tablet (500 mg total) by mouth every 6 (six) hours as needed for muscle spasms.     oxyCODONE 5 MG immediate release tablet  Commonly known as:  Oxy IR/ROXICODONE  Take 1-2 tablets (5-10 mg total) by mouth every 3 (three) hours as needed for moderate pain, severe pain or breakthrough pain.     potassium chloride 20 MEQ packet  Commonly known as:  KLOR-CON  Take 20 mEq by mouth 3 (three) times daily.     psyllium 58.6 % powder  Commonly known as:  METAMUCIL  Take 1 packet by mouth at bedtime.     rivaroxaban 10 MG Tabs tablet  Commonly known as:  XARELTO  - Take 1 tablet (10 mg total) by mouth daily with breakfast. Take Xarelto for two and a half more weeks, then discontinue Xarelto.  - Once the patient has completed the Xarelto, they may resume the 81 mg Aspirin.     simvastatin 20 MG tablet  Commonly known as:  ZOCOR  Take 20 mg by mouth at bedtime.     sodium chloride 0.65 % Soln nasal spray  Commonly known as:  OCEAN  Place 1-2 sprays into both nostrils every morning.     SYSTANE OP  Apply 1 drop to eye every morning.     traMADol 50 MG tablet  Commonly known as:  ULTRAM  Take 1-2 tablets (50-100 mg  total) by mouth every 6 (six) hours as needed (mild pain).     triamterene-hydrochlorothiazide 37.5-25 MG per tablet  Commonly known as:  MAXZIDE-25  Take 1 tablet by mouth every morning.     triazolam 0.125 MG tablet  Commonly known as:  HALCION  Take 0.062 mg by mouth at bedtime as needed for sleep.           Follow-up Information    Follow up with Gearlean Alf, MD. Schedule an appointment as soon as possible for a visit on 05/15/2014.   Specialty:  Orthopedic Surgery   Why:  Call office at [phone] to setup appointment with Dr. Wynelle Link on Tuesday 05/15/2014.   Contact information:   [address] [place] [address] [phone]       Signed: Arlee Muslim, PA-C Orthopaedic Surgery 05/04/2014, 8:33 AM

## 2014-05-04 NOTE — Progress Notes (Signed)
Occupational Therapy Treatment Patient Details Name: Donna Callahan MRN: [id number] DOB: 01/13/1931 Today's Date: 05/04/2014    History of present illness 81 yo female s/p R TKA 05/02/14   OT comments  Pt doing well and supposed to d/c later today. Feel pt is ok to d/c from OT standpoint.   Follow Up Recommendations  No OT follow up;Supervision/Assistance - 24 hour    Equipment Recommendations  None recommended by OT    Recommendations for Other Services      Precautions / Restrictions Precautions Precautions: Knee Restrictions Weight Bearing Restrictions: No Other Position/Activity Restrictions: WBAT       Mobility Bed Mobility               General bed mobility comments: in chair  Transfers Overall transfer level: Needs assistance Equipment used: Rolling walker (2 wheeled) Transfers: Sit to/from Stand Sit to Stand: Min guard         General transfer comment: verbal cues for hand placement    Balance                                   ADL                           Toilet Transfer: Min guard;Ambulation;BSC;RW   Toileting- Clothing Manipulation and Hygiene: Minimal assistance;Sit to/from stand   Tub/ Shower Transfer: Minimal assistance;Rolling walker     General ADL Comments: Pt had already dressed when OT arrived in a gown and underwear. Reviewed sequence for LB dressing and donning over R LE first as well as having walker in front of her when she stands to pull up clothing. Practiced up to 3in1 and did well. Discussed how to adjust 3in1 to appropriate height. Practiced also the shower transfer and discussed placement of 3in1 if walker will versus will not fit through opening of shower. Discussed waiting a day or two if she feels tired or alt all weak before showering. Daughter will be available to assist. Pt did SLR this am.      Vision                     Perception     Praxis      Cognition   Behavior During  [facility] tasks assessed/performed Overall Cognitive Status: Within Functional Limits for tasks assessed                       Extremity/Trunk Assessment               Exercises     Shoulder Instructions       General Comments      Pertinent Vitals/ Pain       Pain Assessment: 0-10 Pain Score: 4  Pain Location: R knee Pain Descriptors / Indicators: Aching Pain Intervention(s): Repositioned;Ice applied  Home Living                                          Prior Functioning/Environment              Frequency Min 2X/week     Progress Toward Goals  OT Goals(current goals can now be found in the care plan section)  Progress towards OT goals: Progressing toward  goals     Plan Discharge plan remains appropriate    Co-evaluation                 End of Session Equipment Utilized During Treatment: Rolling walker CPM Right Knee CPM Right Knee: Off   Activity Tolerance Patient tolerated treatment well   Patient Left in chair;with call bell/phone within reach   Nurse Communication          Time: 1610-9604 OT Time Calculation (min): 30 min  Charges: OT General Charges $OT Visit: 1 Procedure OT Treatments $Self Care/Home Management : 8-22 mins $Therapeutic Activity: 8-22 mins  Lennox Laity  [phone] 05/04/2014, 9:41 AM

## 2014-05-04 NOTE — Progress Notes (Signed)
Physical Therapy Treatment Patient Details Name: Donna Callahan MRN: [id number] DOB: 12-Jul-1930 Today's Date: 05/04/2014    History of Present Illness 81 yo female s/p R TKA 05/02/14    PT Comments    Progressing with mobility. Practiced ambulation and stair negotiation this session. Daughter present to observed. All education completed.   Follow Up Recommendations  Home health PT     Equipment Recommendations  None recommended by PT    Recommendations for Other Services       Precautions / Restrictions Precautions Precautions: Knee Restrictions Weight Bearing Restrictions: No Other Position/Activity Restrictions: WBAT    Mobility  Bed Mobility               General bed mobility comments: pt oob in recliner  Transfers Overall transfer level: Needs assistance Equipment used: Rolling walker (2 wheeled) Transfers: Sit to/from Stand Sit to Stand: Min guard         General transfer comment: close guard for safety. VCs hand placement  Ambulation/Gait Ambulation/Gait assistance: Min guard Ambulation Distance (Feet): 75 Feet Assistive device: Rolling walker (2 wheeled) Gait Pattern/deviations: Step-to pattern;Decreased step length - right;Antalgic     General Gait Details: close guard for safety.    Stairs Stairs: Yes Stairs assistance: Min assist Stair Management: Step to pattern;Forwards Number of Stairs: 2 General stair comments: 1 HHA provided for support. VCs safety, technique, sequence. Assist to stabilize. Daughter present during session.   Wheelchair Mobility    Modified Rankin (Stroke Patients Only)       Balance                                    Cognition Arousal/Alertness: Awake/alert Behavior During Therapy: WFL for tasks assessed/performed Overall Cognitive Status: Within Functional Limits for tasks assessed                      Exercises      General Comments        Pertinent Vitals/Pain Pain  Assessment: 0-10 Pain Score: 5  Pain Location: R knee Pain Descriptors / Indicators: Aching;Sore Pain Intervention(s): Monitored during session;Ice applied    Home Living                      Prior Function            PT Goals (current goals can now be found in the care plan section) Progress towards PT goals: Progressing toward goals    Frequency  7X/week    PT Plan Current plan remains appropriate    Co-evaluation             End of Session Equipment Utilized During Treatment: Gait belt Activity Tolerance: Patient tolerated treatment well Patient left: in chair;with call bell/phone within reach;with family/visitor present     Time: [id number] PT Time Calculation (min) (ACUTE ONLY): 19 min  Charges:  $Gait Training: 8-22 mins                    G Codes:      Donna Callahan, MPT Pager: [phone]

## 2014-05-04 NOTE — Progress Notes (Signed)
   Subjective: 2 Days Post-Op Procedure(s) (LRB): RIGHT TOTAL KNEE ARTHROPLASTY (Right) Patient reports pain as mild.   Patient seen in rounds with Dr. Lequita HaltAluisio.  Doing much better today. Did well with therapy yesterday but had more pain last evening.  Pain better controlled today. Patient is well, and has had no acute complaints or problems Patient is ready to go home following two sessions of therapy.  Objective: Vital signs in last 24 hours: Temp:  [97.9 F (36.6 C)-98.9 F (37.2 C)] 98.7 F (37.1 C) (04/15 0519) Pulse Rate:  [65-73] 70 (04/15 0825) Resp:  [14-16] 16 (04/15 0519) BP: (123-164)/(58-80) 123/58 mmHg (04/15 0825) SpO2:  [96 %-98 %] 97 % (04/15 0519)  Intake/Output from previous day:  Intake/Output Summary (Last 24 hours) at 05/04/14 0826 Last data filed at 05/03/14 1700  Gross per 24 hour  Intake    480 ml  Output    400 ml  Net     80 ml    Labs:  Recent Labs  05/03/14 0510 05/04/14 0455  HGB 11.4* 11.1*    Recent Labs  05/03/14 0510 05/04/14 0455  WBC 9.7 9.9  RBC 3.90 3.74*  HCT 35.0* 33.7*  PLT 209 221    Recent Labs  05/03/14 0510 05/04/14 0455  NA 136 136  K 4.0 4.0  CL 106 102  CO2 24 28  BUN 14 14  CREATININE 0.60 0.71  GLUCOSE 188* 116*  CALCIUM 8.5 8.6   No results for input(s): LABPT, INR in the last 72 hours.  EXAM: General - Patient is Alert, Appropriate and Oriented Extremity - Neurovascular intact Sensation intact distally Dorsiflexion/Plantar flexion intact Incision - clean, dry, no drainage, healing Motor Function - intact, moving foot and toes well on exam.   Assessment/Plan: 2 Days Post-Op Procedure(s) (LRB): RIGHT TOTAL KNEE ARTHROPLASTY (Right) Procedure(s) (LRB): RIGHT TOTAL KNEE ARTHROPLASTY (Right) Past Medical History  Diagnosis Date  . Hypertension   . High cholesterol   . Hard of hearing   . Varicose veins    Principal Problem:   OA (osteoarthritis) of knee  Estimated body mass index is  21.45 kg/(m^2) as calculated from the following:   Height as of this encounter: 5\' 4"  (1.626 m).   Weight as of this encounter: 56.7 kg (125 lb). Up with therapy Discharge home with home health Diet - Cardiac diet Follow up - in 2 weeks Activity - WBAT Disposition - Home Condition Upon Discharge - Good D/C Meds - See DC Summary DVT Prophylaxis - Xarelto  Avel Peacerew Perkins, PA-C Orthopaedic Surgery 05/04/2014, 8:26 AM

## 2014-05-04 NOTE — Progress Notes (Signed)
CARE MANAGEMENT NOTE 05/04/2014  Patient:  Inetta FermoBROWN,Eulalah C   Account Number:  [id number]  Date Initiated:  05/04/2014  Documentation initiated by:  Ferdinand CavaSCHETTINO,Zayden Maffei  Subjective/Objective Assessment:   81 yo female admitted with OA of knee from home     Action/Plan:   discharge planning   Anticipated DC Date:  05/04/2014   Anticipated DC Plan:  HOME W HOME HEALTH SERVICES      DC Planning Services  CM consult      Choice offered to / List presented to:          [facility]  [facility]      [facility]  [facility]   Status of service:  Completed, signed off Medicare Important Message given?   (If response is "NO", the following Medicare IM given date fields will be blank) Date Medicare IM given:   Medicare IM given by:   Date Additional Medicare IM given:   Additional Medicare IM given by:    Discharge Disposition:  HOME W HOME HEALTH SERVICES  Per UR Regulation:    If discussed at Long Length of Stay Meetings, dates discussed:    Comments:  05/04/14 Ferdinand CavaAndrea Schettino RN BSN CM [phone] [facility], reviewed this with patient, and [facility] Verified patient address and contact information.

## 2014-07-23 ENCOUNTER — Encounter (HOSPITAL_BASED_OUTPATIENT_CLINIC_OR_DEPARTMENT_OTHER): Payer: Self-pay | Admitting: *Deleted

## 2014-07-23 ENCOUNTER — Inpatient Hospital Stay (HOSPITAL_BASED_OUTPATIENT_CLINIC_OR_DEPARTMENT_OTHER)
Admission: EM | Admit: 2014-07-23 | Discharge: 2014-07-26 | DRG: 378 | Disposition: A | Discharge status: Home / Self Care | Payer: Medicare Other | Attending: Internal Medicine | Admitting: Internal Medicine

## 2014-07-23 ENCOUNTER — Emergency Department (HOSPITAL_BASED_OUTPATIENT_CLINIC_OR_DEPARTMENT_OTHER): Payer: Medicare Other

## 2014-07-23 DIAGNOSIS — D62 Acute posthemorrhagic anemia: Secondary | ICD-10-CM | POA: Diagnosis present

## 2014-07-23 DIAGNOSIS — H9190 Unspecified hearing loss, unspecified ear: Secondary | ICD-10-CM | POA: Diagnosis present

## 2014-07-23 DIAGNOSIS — Z96651 Presence of right artificial knee joint: Secondary | ICD-10-CM | POA: Diagnosis present

## 2014-07-23 DIAGNOSIS — Z7982 Long term (current) use of aspirin: Secondary | ICD-10-CM

## 2014-07-23 DIAGNOSIS — K625 Hemorrhage of anus and rectum: Secondary | ICD-10-CM | POA: Diagnosis not present

## 2014-07-23 DIAGNOSIS — I10 Essential (primary) hypertension: Secondary | ICD-10-CM | POA: Diagnosis present

## 2014-07-23 DIAGNOSIS — Z79899 Other long term (current) drug therapy: Secondary | ICD-10-CM | POA: Diagnosis not present

## 2014-07-23 DIAGNOSIS — K5731 Diverticulosis of large intestine without perforation or abscess with bleeding: Secondary | ICD-10-CM | POA: Diagnosis present

## 2014-07-23 DIAGNOSIS — K922 Gastrointestinal hemorrhage, unspecified: Secondary | ICD-10-CM

## 2014-07-23 DIAGNOSIS — E785 Hyperlipidemia, unspecified: Secondary | ICD-10-CM | POA: Diagnosis present

## 2014-07-23 LAB — URINALYSIS, ROUTINE W REFLEX MICROSCOPIC
Bilirubin Urine: NEGATIVE
Glucose, UA: NEGATIVE mg/dL
Ketones, ur: NEGATIVE mg/dL
Leukocytes, UA: NEGATIVE
Nitrite: NEGATIVE
PH: 6.5 (ref 5.0–8.0)
Protein, ur: NEGATIVE mg/dL
Specific Gravity, Urine: 1.007 (ref 1.005–1.030)
Urobilinogen, UA: 0.2 mg/dL (ref 0.0–1.0)

## 2014-07-23 LAB — CBC WITH DIFFERENTIAL/PLATELET
Basophils Absolute: 0 10*3/uL (ref 0.0–0.1)
Basophils Relative: 1 % (ref 0–1)
EOS PCT: 3 % (ref 0–5)
Eosinophils Absolute: 0.1 10*3/uL (ref 0.0–0.7)
HEMATOCRIT: 36.6 % (ref 36.0–46.0)
Hemoglobin: 11.9 g/dL — ABNORMAL LOW (ref 12.0–15.0)
LYMPHS ABS: 1.3 10*3/uL (ref 0.7–4.0)
LYMPHS PCT: 23 % (ref 12–46)
MCH: 28.6 pg (ref 26.0–34.0)
MCHC: 32.5 g/dL (ref 30.0–36.0)
MCV: 88 fL (ref 78.0–100.0)
MONO ABS: 0.4 10*3/uL (ref 0.1–1.0)
MONOS PCT: 8 % (ref 3–12)
NEUTROS ABS: 3.7 10*3/uL (ref 1.7–7.7)
NEUTROS PCT: 65 % (ref 43–77)
Platelets: 252 10*3/uL (ref 150–400)
RBC: 4.16 MIL/uL (ref 3.87–5.11)
RDW: 13.4 % (ref 11.5–15.5)
WBC: 5.6 10*3/uL (ref 4.0–10.5)

## 2014-07-23 LAB — COMPREHENSIVE METABOLIC PANEL
ALK PHOS: 73 U/L (ref 38–126)
ALT: 19 U/L (ref 14–54)
AST: 23 U/L (ref 15–41)
Albumin: 4 g/dL (ref 3.5–5.0)
Anion gap: 9 (ref 5–15)
BUN: 15 mg/dL (ref 6–20)
CO2: 28 mmol/L (ref 22–32)
CREATININE: 0.79 mg/dL (ref 0.44–1.00)
Calcium: 9.5 mg/dL (ref 8.9–10.3)
Chloride: 100 mmol/L — ABNORMAL LOW (ref 101–111)
GFR calc Af Amer: >60 mL/min (ref >60)
GFR calc non Af Amer: >60 mL/min (ref >60)
GLUCOSE: 127 mg/dL — AB (ref 65–99)
Potassium: 3.5 mmol/L (ref 3.5–5.1)
SODIUM: 137 mmol/L (ref 135–145)
Total Bilirubin: 0.4 mg/dL (ref 0.3–1.2)
Total Protein: 7 g/dL (ref 6.5–8.1)

## 2014-07-23 LAB — CBC
HCT: 34.4 % — ABNORMAL LOW (ref 36.0–46.0)
HEMOGLOBIN: 11.2 g/dL — AB (ref 12.0–15.0)
MCH: 27.9 pg (ref 26.0–34.0)
MCHC: 32.6 g/dL (ref 30.0–36.0)
MCV: 85.8 fL (ref 78.0–100.0)
Platelets: 236 10*3/uL (ref 150–400)
RBC: 4.01 MIL/uL (ref 3.87–5.11)
RDW: 13.3 % (ref 11.5–15.5)
WBC: 5.6 10*3/uL (ref 4.0–10.5)

## 2014-07-23 LAB — HEMOGLOBIN AND HEMATOCRIT, BLOOD
HCT: 37 % (ref 36.0–46.0)
Hemoglobin: 11.9 g/dL — ABNORMAL LOW (ref 12.0–15.0)

## 2014-07-23 LAB — LIPASE, BLOOD: Lipase: 22 U/L (ref 22–51)

## 2014-07-23 LAB — PROTIME-INR
INR: 1.05 (ref 0.00–1.49)
Prothrombin Time: 13.9 seconds (ref 11.6–15.2)

## 2014-07-23 LAB — URINE MICROSCOPIC-ADD ON

## 2014-07-23 LAB — OCCULT BLOOD X 1 CARD TO LAB, STOOL: Fecal Occult Bld: POSITIVE — AB

## 2014-07-23 LAB — I-STAT CG4 LACTIC ACID, ED: LACTIC ACID, VENOUS: 0.64 mmol/L (ref 0.5–2.0)

## 2014-07-23 LAB — LACTIC ACID, PLASMA: Lactic Acid, Venous: 1 mmol/L (ref 0.5–2.0)

## 2014-07-23 MED ORDER — IOHEXOL 300 MG/ML  SOLN
100.0000 mL | Freq: Once | INTRAMUSCULAR | Status: AC | PRN
  Administered 2014-07-23: 100 mL via INTRAVENOUS

## 2014-07-23 MED ORDER — SODIUM CHLORIDE 0.9 % IV SOLN
Freq: Once | INTRAVENOUS | Status: AC
  Administered 2014-07-23: 1000 mL via INTRAVENOUS

## 2014-07-23 MED ORDER — ONDANSETRON HCL 4 MG PO TABS: 4.0000 mg | ORAL_TABLET | Freq: Four times a day (QID) | ORAL | Status: DC | PRN

## 2014-07-23 MED ORDER — PANTOPRAZOLE SODIUM 40 MG IV SOLR
40.0000 mg | Freq: Two times a day (BID) | INTRAVENOUS | Status: DC
  Administered 2014-07-24 – 2014-07-26 (×6): 40 mg via INTRAVENOUS
  Filled 2014-07-23 (×9): qty 40

## 2014-07-23 MED ORDER — IOHEXOL 300 MG/ML  SOLN
50.0000 mL | Freq: Once | INTRAMUSCULAR | Status: AC | PRN
  Administered 2014-07-23: 50 mL via ORAL

## 2014-07-23 MED ORDER — SODIUM CHLORIDE 0.9 % IV SOLN
INTRAVENOUS | Status: DC
  Administered 2014-07-24: 1000 mL via INTRAVENOUS

## 2014-07-23 MED ORDER — PANTOPRAZOLE SODIUM 40 MG IV SOLR
40.0000 mg | Freq: Once | INTRAVENOUS | Status: AC
Start: 2014-07-23 — End: 2014-07-23
  Administered 2014-07-23: 40 mg via INTRAVENOUS
  Filled 2014-07-23: qty 40

## 2014-07-23 MED ORDER — SODIUM CHLORIDE 0.9 % IV SOLN: INTRAVENOUS | Status: DC

## 2014-07-23 MED ORDER — ACETAMINOPHEN 325 MG PO TABS
650.0000 mg | ORAL_TABLET | Freq: Four times a day (QID) | ORAL | Status: DC | PRN
  Administered 2014-07-25: 650 mg via ORAL
  Filled 2014-07-23: qty 2

## 2014-07-23 MED ORDER — ONDANSETRON HCL 4 MG/2ML IJ SOLN: 4.0000 mg | Freq: Four times a day (QID) | INTRAMUSCULAR | Status: DC | PRN

## 2014-07-23 MED ORDER — ACETAMINOPHEN 650 MG RE SUPP: 650.0000 mg | Freq: Four times a day (QID) | RECTAL | Status: DC | PRN

## 2014-07-23 NOTE — ED Notes (Signed)
Bright red rectal bleeding with her BM this am. She went 2 more times afterward. Denies pain.

## 2014-07-23 NOTE — H&P (Signed)
[facility] History and Physical  [name] WUJ:[id number] DOB: [birth year] DOA: 07/23/2014  Referring physician: Patient was transferred from [facility] [place]. PCP: Ezequiel Kayser, MD  Specialists: Dr. Matthias Hughs. Gastroenterologist.  Chief Complaint: Rectal bleeding.  HPI: Donna Callahan is a 81 y.o. female with history of hypertension and hyperlipidemia presents to the ER because of painless rectal bleeding since morning. In the ER patient had at least 4 bowel movements which were frankly bloody with clots. Patient denies any dizziness. Patient denies any abdominal pain diarrhea nausea vomiting. CT scan of the abdomen and pelvis done in the ER showed diverticulosis with possible bleeding. Patient has been hemodynamically stable and was transferred to [facility]. In the hospital patient had 2 more bowel movements which were bloody. Patient takes aspirin and at times takes Advil for pain. Patient does not take Xarelto though it is listed in her medication list. Patient has had colonoscopy 2 years ago by patient's gastroenterologist results of which are not available in our system. Patient has been admitted for further management of rectal bleeding most likely diverticular bleeding. Patient also has hemorrhoids.   Review of Systems: As presented in the history of presenting illness, rest negative.  Past Medical History  Diagnosis Date  . Hypertension   . High cholesterol   . Hard of hearing   . Varicose veins    Past Surgical History  Procedure Laterality Date  . Tonsillectomy    . Stapedectomy      bil ears  . Varicose vein surgery    . Total knee arthroplasty Right 05/02/2014    Procedure: RIGHT TOTAL KNEE ARTHROPLASTY;  Surgeon: Ollen Gross, MD;  Location: [facility] ORS;  Service: Orthopedics;  Laterality: Right;   Social History:  reports that she has never smoked. She does not have any smokeless tobacco history on file. She reports that she does not drink alcohol or  use illicit drugs. Where does patient live at home. Can patient participate in ADLs? Yes.  Allergies  Allergen Reactions  . Penicillins Hives  . Sulfa Antibiotics Nausea And Vomiting    Family History:  Family History  Problem Relation Age of Onset  . Hypertension Mother       Prior to Admission medications   Medication Sig Start Date End Date Taking? Authorizing Provider  acetaminophen (TYLENOL) 500 MG tablet Take 500-1,000 mg by mouth every morning.    Historical Provider, MD  aspirin 81 MG tablet Take 81 mg by mouth daily at 12 noon.     Historical Provider, MD  diltiazem (TIAZAC) 240 MG 24 hr capsule Take 240 mg by mouth every morning.     Historical Provider, MD  irbesartan (AVAPRO) 150 MG tablet Take 150 mg by mouth at bedtime.    Historical Provider, MD  loperamide (IMODIUM) 2 MG capsule Take by mouth as needed for diarrhea or loose stools.    Historical Provider, MD  methocarbamol (ROBAXIN) 500 MG tablet Take 1 tablet (500 mg total) by mouth every 6 (six) hours as needed for muscle spasms. 05/04/14   Avel Peace, PA-C  oxyCODONE (OXY IR/ROXICODONE) 5 MG immediate release tablet Take 1-2 tablets (5-10 mg total) by mouth every 3 (three) hours as needed for moderate pain, severe pain or breakthrough pain. 05/04/14   Avel Peace, PA-C  Polyethyl Glycol-Propyl Glycol (SYSTANE OP) Apply 1 drop to eye every morning.    Historical Provider, MD  potassium chloride (KLOR-CON) 20 MEQ packet Take 20 mEq by mouth 3 (three)  times daily.     Historical Provider, MD  psyllium (METAMUCIL) 58.6 % powder Take 1 packet by mouth at bedtime.     Historical Provider, MD  rivaroxaban (XARELTO) 10 MG TABS tablet Take 1 tablet (10 mg total) by mouth daily with breakfast. Take Xarelto for two and a half more weeks, then discontinue Xarelto. Once the patient has completed the Xarelto, they may resume the 81 mg Aspirin. 05/04/14   Avel Peace, PA-C  simvastatin (ZOCOR) 20 MG tablet Take 20 mg by mouth at  bedtime.    Historical Provider, MD  sodium chloride (OCEAN) 0.65 % SOLN nasal spray Place 1-2 sprays into both nostrils every morning.    Historical Provider, MD  traMADol (ULTRAM) 50 MG tablet Take 1-2 tablets (50-100 mg total) by mouth every 6 (six) hours as needed (mild pain). 05/04/14   Avel Peace, PA-C  triamterene-hydrochlorothiazide (MAXZIDE-25) 37.5-25 MG per tablet Take 1 tablet by mouth every morning.    Historical Provider, MD  triazolam (HALCION) 0.125 MG tablet Take 0.062 mg by mouth at bedtime as needed for sleep.    Historical Provider, MD    Physical Exam: Filed Vitals:   07/23/14 1600 07/23/14 1829 07/23/14 2046  BP: 130/72 144/84 152/69  Pulse: 80 63 65  Temp: 98.3 F (36.8 C)    TempSrc: Oral    Resp: Height: 5' 3.5" (1.613 m)    Weight: 54.432 kg (120 lb)    SpO2: 98% 96% 99%     General:  Moderately built and nourished.  Eyes: Anicteric no pallor.  ENT: No discharge from ears eyes nose and mouth.  Neck: No mass felt. No neck rigidity.  Cardiovascular: S1 and S2 heard.  Respiratory: No rhonchi or crepitations.  Abdomen: Soft nontender bowel sounds present.  Skin: No rash.  Musculoskeletal: No edema.  Psychiatric: Appears normal.  Neurologic: Alert awake oriented to time place and person. Moves all extremities.  Labs on Admission:  Basic Metabolic Panel:  Recent Labs Lab 07/23/14 1645  NA 137  K 3.5  CL 100*  CO2 28  GLUCOSE 127*  BUN 15  CREATININE 0.79  CALCIUM 9.5   Liver Function Tests:  Recent Labs Lab 07/23/14 1645  AST 23  ALT 19  ALKPHOS 73  BILITOT 0.4  PROT 7.0  ALBUMIN 4.0    Recent Labs Lab 07/23/14 1645  LIPASE 22   No results for input(s): AMMONIA in the last 168 hours. CBC:  Recent Labs Lab 07/23/14 1645 07/23/14 1945  WBC 5.6  --   NEUTROABS 3.7  --   HGB 11.9* 11.9*  HCT 36.6 37.0  MCV 88.0  --   PLT 252  --    Cardiac Enzymes: No results for input(s): CKTOTAL, CKMB, CKMBINDEX,  TROPONINI in the last 168 hours.  BNP (last 3 results) No results for input(s): BNP in the last 8760 hours.  ProBNP (last 3 results) No results for input(s): PROBNP in the last 8760 hours.  CBG: No results for input(s): GLUCAP in the last 168 hours.  Radiological Exams on Admission: Ct Abdomen Pelvis W Contrast  07/23/2014   CLINICAL DATA:  Acute onset of rectal bleeding.  Initial encounter.  EXAM: CT ABDOMEN AND PELVIS WITH CONTRAST  TECHNIQUE: Multidetector CT imaging of the abdomen and pelvis was performed using the standard protocol following bolus administration of intravenous contrast.  CONTRAST:  OMNIPAQUE IOHEXOL 300 MG/ML  SOLN  COMPARISON:  None.  FINDINGS: Minimal left basilar atelectasis  or scarring noted.  A few scattered nonspecific hypodensities within the liver measure up to 9 mm in size and likely reflect small cysts. The spleen is unremarkable in appearance. The gallbladder is within normal limits. There is nonspecific prominence of the pancreatic duct, measuring up to 4 mm, without definite evidence for distal obstruction.  The kidneys are unremarkable in appearance. There is no evidence of hydronephrosis. No renal or ureteral stones are seen. No perinephric stranding is appreciated.  No free fluid is identified. The small bowel is unremarkable in appearance. The stomach is within normal limits. No acute vascular abnormalities are seen. Scattered calcification is noted along the abdominal aorta and its branches.  The appendix is normal in caliber and partially filled with contrast, without evidence for appendicitis. Contrast progresses to the level of the transverse colon.  Scattered diverticulosis is noted along the sigmoid colon. Note is made of increased attenuation within the mid sigmoid colon. This could simply reflect trace extension of oral contrast into the sigmoid colon, though mild acute bleeding into the bowel cannot be entirely excluded given the patient's symptoms.   The bladder is mildly distended and grossly unremarkable. The uterus is unremarkable in appearance. The ovaries are grossly symmetric. No suspicious adnexal masses are seen. No inguinal lymphadenopathy is seen.  No acute osseous abnormalities are identified. Degenerative change is noted at the pubic symphysis. Multilevel vacuum phenomenon is noted along the lumbar spine, with endplate sclerotic change and underlying facet disease.  IMPRESSION: 1. Increased attenuation within the mid sigmoid colon could simply reflect trace extension of oral contrast into the sigmoid colon, though mild acute bleeding into the bowel cannot be entirely excluded given the patient's symptoms. 2. Contrast otherwise progresses to the level of the transverse colon. Scattered diverticulosis noted along the sigmoid colon. 3. Scattered calcification along the abdominal aorta and its branches. 4. Few scattered nonspecific hypodensities in the liver measure up to 9 mm in size and likely reflect small cysts. 5. Nonspecific prominence of the pancreatic duct, measuring up to 4 mm, without definite evidence of distal obstruction. This may simply reflect the patient's baseline. 6. Mild degenerative change noted along the lumbar spine.   Electronically Signed   By: Roanna RaiderJeffery  Chang M.D.   On: 07/23/2014 19:23     Assessment/Plan Active Problems:   Rectal bleeding   Hypertension   Acute blood loss anemia   Hyperlipidemia   Rectal bleed   1. Painless rectal bleeding - most likely diverticular in origin. I have discussed with on-call gastroenterologist Dr. Harrison MonsMaggod. At this time plan is to closely observe any further worsening of bleeding if so patient will need bleeding scan. Gastroenterologist will be seeing patient in consult. Closely follow CBC and if there is significant for hemoglobin operation becomes hypotensive we will transfuse PRBC. Protonix IV. Discontinue aspirin and NSAIDs for now. 2. Acute blood loss anemia - follow  CBC. 3. Hypertension - holding antihypertensives for now and Patient on when necessary IV hydralazine. 4. History of hyperlipidemia - continue statins when patient is stable.   DVT Prophylaxis SCDs.  Code Status: Full code.  Family Communication: Discussed with patient.  Disposition Plan: Admit to inpatient.    Edwar Coe N. [facility] Pager [phone].  If 7PM-7AM, please contact night-coverage [email or web address] Password [email or web address] 07/23/2014, 11:02 PM

## 2014-07-23 NOTE — ED Provider Notes (Signed)
CSN: [id number]     Arrival date & time 07/23/14  1549 History  This chart was scribed for Glynn OctaveStephen Brittan Mapel, MD by Evon Slackerrance Branch, ED Scribe. This patient was seen in room MH02/MH02 and the patient's care was started at 4:05 PM.    Chief Complaint  Patient presents with  . Rectal Bleeding   The history is provided by the patient. No language interpreter was used.   HPI Comments: Donna Callahan is a 81 y.o. female who presents to the Emergency Department complaining of new rectal bleeding onset this morning. Pt states she noticed bright red blood this morning with her BM. Pt states she is unable to describe the stool due to her toilet being filled with blood. She states that she later had 2 more bowel movements where there was only blood present. Does report some low back pain. Denies abdominal pain, constipation, dizziness, light headedness, CP, dysuria, hematuria .  Reports last colonoscopy 2 years prior. Denies being on blood thinners, but states she is on aspirin.   Past Medical History  Diagnosis Date  . Hypertension   . High cholesterol   . Hard of hearing   . Varicose veins    Past Surgical History  Procedure Laterality Date  . Tonsillectomy    . Stapedectomy      bil ears  . Varicose vein surgery    . Total knee arthroplasty Right 05/02/2014    Procedure: RIGHT TOTAL KNEE ARTHROPLASTY;  Surgeon: Ollen GrossFrank Aluisio, MD;  Location: [facility];  Service: Orthopedics;  Laterality: Right;   No family history on file. History  Substance Use Topics  . Smoking status: Never Smoker   . Smokeless tobacco: Not on file  . Alcohol Use: No   OB History    No data available      Review of Systems  Cardiovascular: Negative for chest pain.  Gastrointestinal: Positive for blood in stool. Negative for abdominal pain and constipation.  Genitourinary: Negative for dysuria and hematuria.  Neurological: Negative for dizziness and light-headedness.  All other systems reviewed and are  negative.    Allergies  Penicillins and Sulfa antibiotics  Home Medications   Prior to Admission medications   Medication Sig Start Date End Date Taking? Authorizing Provider  acetaminophen (TYLENOL) 500 MG tablet Take 500-1,000 mg by mouth every morning.    Historical Provider, MD  aspirin 81 MG tablet Take 81 mg by mouth daily at 12 noon.     Historical Provider, MD  diltiazem (TIAZAC) 240 MG 24 hr capsule Take 240 mg by mouth every morning.     Historical Provider, MD  irbesartan (AVAPRO) 150 MG tablet Take 150 mg by mouth at bedtime.    Historical Provider, MD  loperamide (IMODIUM) 2 MG capsule Take by mouth as needed for diarrhea or loose stools.    Historical Provider, MD  methocarbamol (ROBAXIN) 500 MG tablet Take 1 tablet (500 mg total) by mouth every 6 (six) hours as needed for muscle spasms. 05/04/14   Avel Peacerew Perkins, PA-C  oxyCODONE (OXY IR/ROXICODONE) 5 MG immediate release tablet Take 1-2 tablets (5-10 mg total) by mouth every 3 (three) hours as needed for moderate pain, severe pain or breakthrough pain. 05/04/14   Avel Peacerew Perkins, PA-C  Polyethyl Glycol-Propyl Glycol (SYSTANE OP) Apply 1 drop to eye every morning.    Historical Provider, MD  potassium chloride (KLOR-CON) 20 MEQ packet Take 20 mEq by mouth 3 (three) times daily.     Historical Provider, MD  psyllium (METAMUCIL)  58.6 % powder Take 1 packet by mouth at bedtime.     Historical Provider, MD  rivaroxaban (XARELTO) 10 MG TABS tablet Take 1 tablet (10 mg total) by mouth daily with breakfast. Take Xarelto for two and a half more weeks, then discontinue Xarelto. Once the patient has completed the Xarelto, they may resume the 81 mg Aspirin. 05/04/14   Avel Peace, PA-C  simvastatin (ZOCOR) 20 MG tablet Take 20 mg by mouth at bedtime.    Historical Provider, MD  sodium chloride (OCEAN) 0.65 % SOLN nasal spray Place 1-2 sprays into both nostrils every morning.    Historical Provider, MD  traMADol (ULTRAM) 50 MG tablet Take 1-2  tablets (50-100 mg total) by mouth every 6 (six) hours as needed (mild pain). 05/04/14   Avel Peace, PA-C  triamterene-hydrochlorothiazide (MAXZIDE-25) 37.5-25 MG per tablet Take 1 tablet by mouth every morning.    Historical Provider, MD  triazolam (HALCION) 0.125 MG tablet Take 0.062 mg by mouth at bedtime as needed for sleep.    Historical Provider, MD   BP 144/84 mmHg  Pulse 63  Temp(Src) 98.3 F (36.8 C) (Oral)  Resp 16  Ht 5' 3.5" (1.613 m)  Wt 120 lb (54.432 kg)  BMI 20.92 kg/m2  SpO2 96%   Physical Exam  Constitutional: She is oriented to person, place, and time. She appears well-developed and well-nourished. No distress.  HENT:  Head: Normocephalic and atraumatic.  Mouth/Throat: Oropharynx is clear and moist. No oropharyngeal exudate.  Eyes: Conjunctivae and EOM are normal. Pupils are equal, round, and reactive to light.  Neck: Normal range of motion. Neck supple.  No meningismus.  Cardiovascular: Normal rate, regular rhythm, normal heart sounds and intact distal pulses.   No murmur heard. Pulmonary/Chest: Effort normal and breath sounds normal. No respiratory distress.  Abdominal: Soft. There is tenderness. There is no rebound and no guarding.  Mild diffuse tenderness.   Genitourinary:  Chaperone present. Multiple ext hemorrhoids gross blood on examining with finger with clots. No appreciable fissures.   Musculoskeletal: Normal range of motion. She exhibits no edema or tenderness.  Neurological: She is alert and oriented to person, place, and time. No cranial nerve deficit. She exhibits normal muscle tone. Coordination normal.  No ataxia on finger to nose bilaterally. No pronator drift. 5/5 strength throughout. CN 2-12 intact. Negative Romberg. Equal grip strength. Sensation intact. Gait is normal.   Skin: Skin is warm.  Psychiatric: She has a normal mood and affect. Her behavior is normal.  Nursing note and vitals reviewed.   ED Course  Procedures (including critical  care time) DIAGNOSTIC STUDIES: Oxygen Saturation is 98% on RA, normal by my interpretation.    COORDINATION OF CARE: 4:14 PM-Discussed treatment plan with pt at bedside and pt agreed to plan.     Labs Review Labs Reviewed  CBC WITH DIFFERENTIAL/PLATELET - Abnormal; Notable for the following:    Hemoglobin 11.9 (*)    All other components within normal limits  COMPREHENSIVE METABOLIC PANEL - Abnormal; Notable for the following:    Chloride 100 (*)    Glucose, Bld 127 (*)    All other components within normal limits  URINALYSIS, ROUTINE W REFLEX MICROSCOPIC (NOT AT [facility]) - Abnormal; Notable for the following:    Hgb urine dipstick LARGE (*)    All other components within normal limits  OCCULT BLOOD X 1 CARD TO LAB, STOOL - Abnormal; Notable for the following:    Fecal Occult Bld POSITIVE (*)  All other components within normal limits  HEMOGLOBIN AND HEMATOCRIT, BLOOD - Abnormal; Notable for the following:    Hemoglobin 11.9 (*)    All other components within normal limits  LIPASE, BLOOD  PROTIME-INR  URINE MICROSCOPIC-ADD ON  I-STAT CG4 LACTIC ACID, ED    Imaging Review Ct Abdomen Pelvis W Contrast  07/23/2014   CLINICAL DATA:  Acute onset of rectal bleeding.  Initial encounter.  EXAM: CT ABDOMEN AND PELVIS WITH CONTRAST  TECHNIQUE: Multidetector CT imaging of the abdomen and pelvis was performed using the standard protocol following bolus administration of intravenous contrast.  CONTRAST:  OMNIPAQUE IOHEXOL 300 MG/ML  SOLN  COMPARISON:  None.  FINDINGS: Minimal left basilar atelectasis or scarring noted.  A few scattered nonspecific hypodensities within the liver measure up to 9 mm in size and likely reflect small cysts. The spleen is unremarkable in appearance. The gallbladder is within normal limits. There is nonspecific prominence of the pancreatic duct, measuring up to 4 mm, without definite evidence for distal obstruction.  The kidneys are unremarkable in appearance.  There is no evidence of hydronephrosis. No renal or ureteral stones are seen. No perinephric stranding is appreciated.  No free fluid is identified. The small bowel is unremarkable in appearance. The stomach is within normal limits. No acute vascular abnormalities are seen. Scattered calcification is noted along the abdominal aorta and its branches.  The appendix is normal in caliber and partially filled with contrast, without evidence for appendicitis. Contrast progresses to the level of the transverse colon.  Scattered diverticulosis is noted along the sigmoid colon. Note is made of increased attenuation within the mid sigmoid colon. This could simply reflect trace extension of oral contrast into the sigmoid colon, though mild acute bleeding into the bowel cannot be entirely excluded given the patient's symptoms.  The bladder is mildly distended and grossly unremarkable. The uterus is unremarkable in appearance. The ovaries are grossly symmetric. No suspicious adnexal masses are seen. No inguinal lymphadenopathy is seen.  No acute osseous abnormalities are identified. Degenerative change is noted at the pubic symphysis. Multilevel vacuum phenomenon is noted along the lumbar spine, with endplate sclerotic change and underlying facet disease.  IMPRESSION: 1. Increased attenuation within the mid sigmoid colon could simply reflect trace extension of oral contrast into the sigmoid colon, though mild acute bleeding into the bowel cannot be entirely excluded given the patient's symptoms. 2. Contrast otherwise progresses to the level of the transverse colon. Scattered diverticulosis noted along the sigmoid colon. 3. Scattered calcification along the abdominal aorta and its branches. 4. Few scattered nonspecific hypodensities in the liver measure up to 9 mm in size and likely reflect small cysts. 5. Nonspecific prominence of the pancreatic duct, measuring up to 4 mm, without definite evidence of distal obstruction. This  may simply reflect the patient's baseline. 6. Mild degenerative change noted along the lumbar spine.   Electronically Signed   By: Roanna Raider M.D.   On: 07/23/2014 19:23     EKG Interpretation None      MDM   Final diagnoses:  Rectal bleeding   4 episodes of bright red blood per rectum today. History of hemorrhoids. No abdominal pain or rectal pain. No dizziness or lightheadedness.  External hemorrhoids on exam with some gross blood. Abdomen is soft and nontender. Heart rate 58 with lying, 78 with standing.  Hemoglobin is stable.  Patient has had approximate 4 episodes of rectal bleeding since her stay in the ED. CT scan showed  attenuation of the mid sigmoid colon which could reflect blood. Suspect her bleeding is probably diverticular. She is not anticoagulated. She reports having a colonoscopy in the past by Dr. Matthias Hughs that was normal.   Given her age and ongoing bleeding, will admit for monitoring overnight. Her vitals remained stable. D/w Dr. Onalee Hua.  I personally performed the services described in this documentation, which was scribed in my presence. The recorded information has been reviewed and is accurate.    Glynn Octave, MD 07/23/14 2018

## 2014-07-24 ENCOUNTER — Encounter (HOSPITAL_COMMUNITY): Payer: Self-pay

## 2014-07-24 LAB — COMPREHENSIVE METABOLIC PANEL
ALK PHOS: 66 U/L (ref 38–126)
ALT: 17 U/L (ref 14–54)
AST: 17 U/L (ref 15–41)
Albumin: 3.4 g/dL — ABNORMAL LOW (ref 3.5–5.0)
Anion gap: 8 (ref 5–15)
BUN: 9 mg/dL (ref 6–20)
CALCIUM: 8.7 mg/dL — AB (ref 8.9–10.3)
CO2: 27 mmol/L (ref 22–32)
Chloride: 107 mmol/L (ref 101–111)
Creatinine, Ser: 0.61 mg/dL (ref 0.44–1.00)
GFR calc Af Amer: >60 mL/min (ref >60)
GFR calc non Af Amer: >60 mL/min (ref >60)
Glucose, Bld: 92 mg/dL (ref 65–99)
Potassium: 3.3 mmol/L — ABNORMAL LOW (ref 3.5–5.1)
SODIUM: 142 mmol/L (ref 135–145)
TOTAL PROTEIN: 6 g/dL — AB (ref 6.5–8.1)
Total Bilirubin: 0.5 mg/dL (ref 0.3–1.2)

## 2014-07-24 LAB — CBC
HEMATOCRIT: 31.3 % — AB (ref 36.0–46.0)
HEMATOCRIT: 31.8 % — AB (ref 36.0–46.0)
HEMATOCRIT: 29.8 % — AB (ref 36.0–46.0)
HEMATOCRIT: 30.5 % — AB (ref 36.0–46.0)
Hemoglobin: 10.1 g/dL — ABNORMAL LOW (ref 12.0–15.0)
Hemoglobin: 10.6 g/dL — ABNORMAL LOW (ref 12.0–15.0)
Hemoglobin: 9.7 g/dL — ABNORMAL LOW (ref 12.0–15.0)
Hemoglobin: 9.9 g/dL — ABNORMAL LOW (ref 12.0–15.0)
MCH: 27.6 pg (ref 26.0–34.0)
MCH: 28.9 pg (ref 26.0–34.0)
MCH: 28.3 pg (ref 26.0–34.0)
MCH: 28 pg (ref 26.0–34.0)
MCHC: 32.3 g/dL (ref 30.0–36.0)
MCHC: 33.3 g/dL (ref 30.0–36.0)
MCHC: 32.6 g/dL (ref 30.0–36.0)
MCHC: 32.5 g/dL (ref 30.0–36.0)
MCV: 85.5 fL (ref 78.0–100.0)
MCV: 86.6 fL (ref 78.0–100.0)
MCV: 86.9 fL (ref 78.0–100.0)
MCV: 86.4 fL (ref 78.0–100.0)
PLATELETS: 214 10*3/uL (ref 150–400)
Platelets: 203 10*3/uL (ref 150–400)
Platelets: 217 10*3/uL (ref 150–400)
Platelets: 219 10*3/uL (ref 150–400)
RBC: 3.66 MIL/uL — ABNORMAL LOW (ref 3.87–5.11)
RBC: 3.67 MIL/uL — ABNORMAL LOW (ref 3.87–5.11)
RBC: 3.43 MIL/uL — AB (ref 3.87–5.11)
RBC: 3.53 MIL/uL — ABNORMAL LOW (ref 3.87–5.11)
RDW: 13.3 % (ref 11.5–15.5)
RDW: 13.5 % (ref 11.5–15.5)
RDW: 13.7 % (ref 11.5–15.5)
RDW: 13.5 % (ref 11.5–15.5)
WBC: 6 10*3/uL (ref 4.0–10.5)
WBC: 4.9 10*3/uL (ref 4.0–10.5)
WBC: 4.9 10*3/uL (ref 4.0–10.5)
WBC: 4.6 10*3/uL (ref 4.0–10.5)

## 2014-07-24 LAB — TYPE AND SCREEN
ABO/RH(D): B POS
Antibody Screen: NEGATIVE

## 2014-07-24 LAB — MRSA PCR SCREENING: MRSA by PCR: NEGATIVE

## 2014-07-24 LAB — ABO/RH: ABO/RH(D): B POS

## 2014-07-24 MED ORDER — DILTIAZEM HCL ER COATED BEADS 240 MG PO CP24
240.0000 mg | ORAL_CAPSULE | Freq: Every day | ORAL | Status: DC
  Administered 2014-07-24 – 2014-07-26 (×3): 240 mg via ORAL
  Filled 2014-07-24 (×5): qty 1

## 2014-07-24 MED ORDER — ZOLPIDEM TARTRATE 5 MG PO TABS: 5.0000 mg | ORAL_TABLET | Freq: Once | ORAL | Status: DC

## 2014-07-24 MED ORDER — HYDRALAZINE HCL 20 MG/ML IJ SOLN: 5.0000 mg | INTRAMUSCULAR | Status: DC | PRN

## 2014-07-24 NOTE — Consult Note (Signed)
[facility] Consult Note  Referring Provider: No ref. provider found Primary Care Physician:  Jerlyn Ly, MD Primary Gastroenterologist:  Dr.  Laurel Dimmer Complaint: rectal bleeding HPI: Donna Callahan is an 81 y.o. white   female  Who presented with sudden  onset of painless rectal bleeding described as bright red blod with clots. She denied any dizziness, nausea or weakness . She had a colonoscopy in 2013 showing sigmoid diverticulosis. Without other findings. She states her stools are darker today than yesterday. She has been on xarelto in the past but not currently  Past Medical History  Diagnosis Date  . Hypertension   . High cholesterol   . Hard of hearing   . Varicose veins     Past Surgical History  Procedure Laterality Date  . Tonsillectomy    . Stapedectomy      bil ears  . Varicose vein surgery    . Total knee arthroplasty Right 05/02/2014    Procedure: RIGHT TOTAL KNEE ARTHROPLASTY;  Surgeon: Gaynelle Arabian, MD;  Location: [facility];  Service: Orthopedics;  Laterality: Right;    Medications Prior to Admission  Medication Sig Dispense Refill  . acetaminophen (TYLENOL) 500 MG tablet Take 500-1,000 mg by mouth every morning.    Marland Kitchen aspirin 81 MG tablet Take 81 mg by mouth daily at 12 noon.     . diltiazem (TIAZAC) 240 MG 24 hr capsule Take 240 mg by mouth every morning.     . irbesartan (AVAPRO) 150 MG tablet Take 150 mg by mouth at bedtime.    Marland Kitchen loperamide (IMODIUM) 2 MG capsule Take by mouth as needed for diarrhea or loose stools.    . methocarbamol (ROBAXIN) 500 MG tablet Take 1 tablet (500 mg total) by mouth every 6 (six) hours as needed for muscle spasms. 80 tablet 0  . oxyCODONE (OXY IR/ROXICODONE) 5 MG immediate release tablet Take 1-2 tablets (5-10 mg total) by mouth every 3 (three) hours as needed for moderate pain, severe pain or breakthrough pain. 90 tablet 0  . Polyethyl Glycol-Propyl Glycol (SYSTANE OP) Apply 1 drop to eye every morning.    . potassium  chloride (KLOR-CON) 20 MEQ packet Take 20 mEq by mouth 3 (three) times daily.     . psyllium (METAMUCIL) 58.6 % powder Take 1 packet by mouth at bedtime.     . rivaroxaban (XARELTO) 10 MG TABS tablet Take 1 tablet (10 mg total) by mouth daily with breakfast. Take Xarelto for two and a half more weeks, then discontinue Xarelto. Once the patient has completed the Xarelto, they may resume the 81 mg Aspirin. 19 tablet 0  . simvastatin (ZOCOR) 20 MG tablet Take 20 mg by mouth at bedtime.    . sodium chloride (OCEAN) 0.65 % SOLN nasal spray Place 1-2 sprays into both nostrils every morning.    . traMADol (ULTRAM) 50 MG tablet Take 1-2 tablets (50-100 mg total) by mouth every 6 (six) hours as needed (mild pain). 60 tablet 1  . triamterene-hydrochlorothiazide (MAXZIDE-25) 37.5-25 MG per tablet Take 1 tablet by mouth every morning.    . triazolam (HALCION) 0.125 MG tablet Take 0.062 mg by mouth at bedtime as needed for sleep.      Allergies:  Allergies  Allergen Reactions  . Penicillins Hives  . Sulfa Antibiotics Nausea And Vomiting    Family History  Problem Relation Age of Onset  . Hypertension Mother     Social History:  reports that she has never smoked. She does not  have any smokeless tobacco history on file. She reports that she does not drink alcohol or use illicit drugs.  Review of Systems: negative except as above    Blood pressure 138/68, pulse 70, temperature 98.2 F (36.8 C), temperature source Oral, resp. rate 15, height 5' 3.5" (1.613 m), weight 54.432 kg (120 lb), SpO2 96 %. Head: Normocephalic, without obvious abnormality, atraumatic Neck: no adenopathy, no carotid bruit, no JVD, supple, symmetrical, trachea midline and thyroid not enlarged, symmetric, no tenderness/mass/nodules Resp: clear to auscultation bilaterally Cardio: regular rate and rhythm, S1, S2 normal, no murmur, click, rub or gallop GI: abdomen soft, nontender  Extremities: extremities normal, atraumatic, no  cyanosis or edema  Results for orders placed or performed during the hospital encounter of 07/23/14 (from the past 48 hour(s))  Occult blood card to lab, stool Provider will collect     Status: Abnormal   Collection Time: 07/23/14  4:20 PM  Result Value Ref Range   Fecal Occult Bld POSITIVE (A) NEGATIVE  CBC with Differential/Platelet     Status: Abnormal   Collection Time: 07/23/14  4:45 PM  Result Value Ref Range   WBC 5.6 4.0 - 10.5 K/uL   RBC 4.16 3.87 - 5.11 MIL/uL   Hemoglobin 11.9 (L) 12.0 - 15.0 g/dL   HCT 36.6 36.0 - 46.0 %   MCV 88.0 78.0 - 100.0 fL   MCH 28.6 26.0 - 34.0 pg   MCHC 32.5 30.0 - 36.0 g/dL   RDW 13.4 11.5 - 15.5 %   Platelets 252 150 - 400 K/uL   Neutrophils Relative % 65 43 - 77 %   Neutro Abs 3.7 1.7 - 7.7 K/uL   Lymphocytes Relative 23 12 - 46 %   Lymphs Abs 1.3 0.7 - 4.0 K/uL   Monocytes Relative 8 3 - 12 %   Monocytes Absolute 0.4 0.1 - 1.0 K/uL   Eosinophils Relative 3 0 - 5 %   Eosinophils Absolute 0.1 0.0 - 0.7 K/uL   Basophils Relative 1 0 - 1 %   Basophils Absolute 0.0 0.0 - 0.1 K/uL  Comprehensive metabolic panel     Status: Abnormal   Collection Time: 07/23/14  4:45 PM  Result Value Ref Range   Sodium 137 135 - 145 mmol/L   Potassium 3.5 3.5 - 5.1 mmol/L   Chloride 100 (L) 101 - 111 mmol/L   CO2 28 22 - 32 mmol/L   Glucose, Bld 127 (H) 65 - 99 mg/dL   BUN 15 6 - 20 mg/dL   Creatinine, Ser 0.79 0.44 - 1.00 mg/dL   Calcium 9.5 8.9 - 10.3 mg/dL   Total Protein 7.0 6.5 - 8.1 g/dL   Albumin 4.0 3.5 - 5.0 g/dL   AST 23 15 - 41 U/L   ALT 19 14 - 54 U/L   Alkaline Phosphatase 73 38 - 126 U/L   Total Bilirubin 0.4 0.3 - 1.2 mg/dL   GFR calc non Af Amer >60 >60 mL/min   GFR calc Af Amer >60 >60 mL/min    Comment: (NOTE) The eGFR has been calculated using the CKD EPI equation. This calculation has not been validated in all clinical situations. eGFR's persistently <60 mL/min signify possible Chronic Kidney Disease.    Anion gap 9 5 - 15   Lipase, blood     Status: None   Collection Time: 07/23/14  4:45 PM  Result Value Ref Range   Lipase 22 22 - 51 U/L  Protime-INR  Status: None   Collection Time: 07/23/14  4:45 PM  Result Value Ref Range   Prothrombin Time 13.9 11.6 - 15.2 seconds   INR 1.05 0.00 - 1.49  Urinalysis, Routine w reflex microscopic (not at [facility])     Status: Abnormal   Collection Time: 07/23/14  5:12 PM  Result Value Ref Range   Color, Urine YELLOW YELLOW   APPearance CLEAR CLEAR   Specific Gravity, Urine 1.007 1.005 - 1.030   pH 6.5 5.0 - 8.0   Glucose, UA NEGATIVE NEGATIVE mg/dL   Hgb urine dipstick LARGE (A) NEGATIVE   Bilirubin Urine NEGATIVE NEGATIVE   Ketones, ur NEGATIVE NEGATIVE mg/dL   Protein, ur NEGATIVE NEGATIVE mg/dL   Urobilinogen, UA 0.2 0.0 - 1.0 mg/dL   Nitrite NEGATIVE NEGATIVE   Leukocytes, UA NEGATIVE NEGATIVE  Urine microscopic-add on     Status: None   Collection Time: 07/23/14  5:12 PM  Result Value Ref Range   RBC / HPF 21-50 <3 RBC/hpf  Hemoglobin and hematocrit, blood     Status: Abnormal   Collection Time: 07/23/14  7:45 PM  Result Value Ref Range   Hemoglobin 11.9 (L) 12.0 - 15.0 g/dL   HCT 37.0 36.0 - 46.0 %  I-Stat CG4 Lactic Acid, ED     Status: None   Collection Time: 07/23/14  7:54 PM  Result Value Ref Range   Lactic Acid, Venous 0.64 0.5 - 2.0 mmol/L  Lactic acid, plasma     Status: None   Collection Time: 07/23/14 10:42 PM  Result Value Ref Range   Lactic Acid, Venous 1.0 0.5 - 2.0 mmol/L  CBC     Status: Abnormal   Collection Time: 07/23/14 10:46 PM  Result Value Ref Range   WBC 5.6 4.0 - 10.5 K/uL   RBC 4.01 3.87 - 5.11 MIL/uL   Hemoglobin 11.2 (L) 12.0 - 15.0 g/dL   HCT 34.4 (L) 36.0 - 46.0 %   MCV 85.8 78.0 - 100.0 fL   MCH 27.9 26.0 - 34.0 pg   MCHC 32.6 30.0 - 36.0 g/dL   RDW 13.3 11.5 - 15.5 %   Platelets 236 150 - 400 K/uL  Type and screen     Status: None   Collection Time: 07/23/14 10:46 PM  Result Value Ref Range   ABO/RH(D) B POS     Antibody Screen NEG    Sample Expiration 07/26/2014   ABO/Rh     Status: None (Preliminary result)   Collection Time: 07/23/14 10:46 PM  Result Value Ref Range   ABO/RH(D) B POS   MRSA PCR Screening     Status: None   Collection Time: 07/24/14 12:20 AM  Result Value Ref Range   MRSA by PCR NEGATIVE NEGATIVE    Comment:        The GeneXpert MRSA Assay (FDA approved for NASAL specimens only), is one component of a comprehensive MRSA colonization surveillance program. It is not intended to diagnose MRSA infection nor to guide or monitor treatment for MRSA infections.   CBC     Status: Abnormal   Collection Time: 07/24/14  2:05 AM  Result Value Ref Range   WBC 6.0 4.0 - 10.5 K/uL   RBC 3.66 (L) 3.87 - 5.11 MIL/uL   Hemoglobin 10.1 (L) 12.0 - 15.0 g/dL   HCT 31.3 (L) 36.0 - 46.0 %   MCV 85.5 78.0 - 100.0 fL   MCH 27.6 26.0 - 34.0 pg   MCHC 32.3 30.0 - 36.0  g/dL   RDW 13.3 11.5 - 15.5 %   Platelets 203 150 - 400 K/uL  CBC     Status: Abnormal   Collection Time: 07/24/14  7:30 AM  Result Value Ref Range   WBC 4.9 4.0 - 10.5 K/uL   RBC 3.67 (L) 3.87 - 5.11 MIL/uL   Hemoglobin 10.6 (L) 12.0 - 15.0 g/dL   HCT 31.8 (L) 36.0 - 46.0 %   MCV 86.6 78.0 - 100.0 fL   MCH 28.9 26.0 - 34.0 pg   MCHC 33.3 30.0 - 36.0 g/dL   RDW 13.5 11.5 - 15.5 %   Platelets 217 150 - 400 K/uL  Comprehensive metabolic panel     Status: Abnormal   Collection Time: 07/24/14  7:30 AM  Result Value Ref Range   Sodium 142 135 - 145 mmol/L   Potassium 3.3 (L) 3.5 - 5.1 mmol/L   Chloride 107 101 - 111 mmol/L   CO2 27 22 - 32 mmol/L   Glucose, Bld 92 65 - 99 mg/dL   BUN 9 6 - 20 mg/dL   Creatinine, Ser 0.61 0.44 - 1.00 mg/dL   Calcium 8.7 (L) 8.9 - 10.3 mg/dL   Total Protein 6.0 (L) 6.5 - 8.1 g/dL   Albumin 3.4 (L) 3.5 - 5.0 g/dL   AST 17 15 - 41 U/L   ALT 17 14 - 54 U/L   Alkaline Phosphatase 66 38 - 126 U/L   Total Bilirubin 0.5 0.3 - 1.2 mg/dL   GFR calc non Af Amer >60 >60 mL/min   GFR calc  Af Amer >60 >60 mL/min    Comment: (NOTE) The eGFR has been calculated using the CKD EPI equation. This calculation has not been validated in all clinical situations. eGFR's persistently <60 mL/min signify possible Chronic Kidney Disease.    Anion gap 8 5 - 15   Ct Abdomen Pelvis W Contrast  07/23/2014   CLINICAL DATA:  Acute onset of rectal bleeding.  Initial encounter.  EXAM: CT ABDOMEN AND PELVIS WITH CONTRAST  TECHNIQUE: Multidetector CT imaging of the abdomen and pelvis was performed using the standard protocol following bolus administration of intravenous contrast.  CONTRAST:  181mL OMNIPAQUE IOHEXOL 300 MG/ML  SOLN  COMPARISON:  None.  FINDINGS: Minimal left basilar atelectasis or scarring noted.  A few scattered nonspecific hypodensities within the liver measure up to 9 mm in size and likely reflect small cysts. The spleen is unremarkable in appearance. The gallbladder is within normal limits. There is nonspecific prominence of the pancreatic duct, measuring up to 4 mm, without definite evidence for distal obstruction.  The kidneys are unremarkable in appearance. There is no evidence of hydronephrosis. No renal or ureteral stones are seen. No perinephric stranding is appreciated.  No free fluid is identified. The small bowel is unremarkable in appearance. The stomach is within normal limits. No acute vascular abnormalities are seen. Scattered calcification is noted along the abdominal aorta and its branches.  The appendix is normal in caliber and partially filled with contrast, without evidence for appendicitis. Contrast progresses to the level of the transverse colon.  Scattered diverticulosis is noted along the sigmoid colon. Note is made of increased attenuation within the mid sigmoid colon. This could simply reflect trace extension of oral contrast into the sigmoid colon, though mild acute bleeding into the bowel cannot be entirely excluded given the patient's symptoms.  The bladder is mildly  distended and grossly unremarkable. The uterus is unremarkable in appearance. The ovaries are  grossly symmetric. No suspicious adnexal masses are seen. No inguinal lymphadenopathy is seen.  No acute osseous abnormalities are identified. Degenerative change is noted at the pubic symphysis. Multilevel vacuum phenomenon is noted along the lumbar spine, with endplate sclerotic change and underlying facet disease.  IMPRESSION: 1. Increased attenuation within the mid sigmoid colon could simply reflect trace extension of oral contrast into the sigmoid colon, though mild acute bleeding into the bowel cannot be entirely excluded given the patient's symptoms. 2. Contrast otherwise progresses to the level of the transverse colon. Scattered diverticulosis noted along the sigmoid colon. 3. Scattered calcification along the abdominal aorta and its branches. 4. Few scattered nonspecific hypodensities in the liver measure up to 9 mm in size and likely reflect small cysts. 5. Nonspecific prominence of the pancreatic duct, measuring up to 4 mm, without definite evidence of distal obstruction. This may simply reflect the patient's baseline. 6. Mild degenerative change noted along the lumbar spine.   Electronically Signed   By: Garald Balding M.D.   On: 07/23/2014 19:23    Assessment: Lower GI bleed, likely diverticular by presentation and prior colonoscopy findings Plan:  Monitor stools and hemoglobin. Colonoscopy if does not stop on its own  Kensy Blizard C 07/24/2014, 10:04 AM  Pager [phone] If no answer or after 5 PM call [phone]

## 2014-07-24 NOTE — Progress Notes (Signed)
[facility] PROGRESS NOTE  Donna Callahan AVW:[id number] DOB: 05-Jun-1930 DOA: 07/23/2014 PCP: Ezequiel Kayser, MD  Assessment/Plan: 1. Painless BRBPR  - most likely diverticular, improving  -hopefully expect this to resolve with conservative mgt  -[facility] to see  -h/o colonoscopy approximately 2 years ago   -Monitor Hb, hold ASA  2. Acute blood loss anemia  - follow CBC. - Transfuse if less than 7 or symptomatic   3. Hypertension  -resume diltiazem, maxzide and ARB on hold -PRN  IV hydralazine.  DVT Prophylaxis SCDs  Code Status: Full Code Family Communication: daughter at bedside Disposition Plan: when stable   Consultants:  Enid Baas  HPI/Subjective: 1 BM this am with very little blood  Objective: Filed Vitals:   07/24/14 0751  BP: 138/68  Pulse: 70  Temp: 98.2 F (36.8 C)  Resp: 15    Intake/Output Summary (Last 24 hours) at 07/24/14 1109 Last data filed at 07/24/14 1000  Gross per 24 hour  Intake 1072.5 ml  Output      0 ml  Net 1072.5 ml   Filed Weights   07/23/14 1600  Weight: 54.432 kg (120 lb)    Exam:   General:  AAOx3, very pleasant, Donna distress  Cardiovascular: S1S2/RRR  Respiratory: CTAB  Abdomen: soft, NT, BS present  Musculoskeletal: Donna edema c/c    Data Reviewed: Basic Metabolic Panel:  Recent Labs Lab 07/23/14 1645 07/24/14 0730  NA 137 142  K 3.5 3.3*  CL 100* 107  CO2 28 27  GLUCOSE 127* 92  BUN 15 9  CREATININE 0.79 0.61  CALCIUM 9.5 8.7*   Liver Function Tests:  Recent Labs Lab 07/23/14 1645 07/24/14 0730  AST 23 17  ALT 19 17  ALKPHOS 73 66  BILITOT 0.4 0.5  PROT 7.0 6.0*  ALBUMIN 4.0 3.4*    Recent Labs Lab 07/23/14 1645  LIPASE 22   Donna results for input(s): AMMONIA in the last 168 hours. CBC:  Recent Labs Lab 07/23/14 1645 07/23/14 1945 07/23/14 2246 07/24/14 0205 07/24/14 0730  WBC 5.6  --  5.6 6.0 4.9  NEUTROABS 3.7  --   --   --   --   HGB 11.9* 11.9* 11.2* 10.1* 10.6*   HCT 36.6 37.0 34.4* 31.3* 31.8*  MCV 88.0  --  85.8 85.5 86.6  PLT 252  --  236 203 217   Cardiac Enzymes: Donna results for input(s): CKTOTAL, CKMB, CKMBINDEX, TROPONINI in the last 168 hours. BNP (last 3 results) Donna results for input(s): BNP in the last 8760 hours.  ProBNP (last 3 results) Donna results for input(s): PROBNP in the last 8760 hours.  CBG: Donna results for input(s): GLUCAP in the last 168 hours.  Recent Results (from the past 240 hour(s))  MRSA PCR Screening     Status: None   Collection Time: 07/24/14 12:20 AM  Result Value Ref Range Status   MRSA by PCR NEGATIVE NEGATIVE Final    Comment:        The GeneXpert MRSA Assay (FDA approved for NASAL specimens only), is Callahan component of a comprehensive MRSA colonization surveillance program. It is not intended to diagnose MRSA infection nor to guide or monitor treatment for MRSA infections.      Studies: Ct Abdomen Pelvis W Contrast  07/23/2014   CLINICAL DATA:  Acute onset of rectal bleeding.  Initial encounter.  EXAM: CT ABDOMEN AND PELVIS WITH CONTRAST  TECHNIQUE: Multidetector CT imaging of the abdomen and pelvis was performed  using the standard protocol following bolus administration of intravenous contrast.  CONTRAST:  100mL OMNIPAQUE IOHEXOL 300 MG/ML  SOLN  COMPARISON:  None.  FINDINGS: Minimal left basilar atelectasis or scarring noted.  A few scattered nonspecific hypodensities within the liver measure up to 9 mm in size and likely reflect small cysts. The spleen is unremarkable in appearance. The gallbladder is within normal limits. There is nonspecific prominence of the pancreatic duct, measuring up to 4 mm, without definite evidence for distal obstruction.  The kidneys are unremarkable in appearance. There is Donna evidence of hydronephrosis. Donna renal or ureteral stones are seen. Donna perinephric stranding is appreciated.  Donna free fluid is identified. The small bowel is unremarkable in appearance. The stomach is within  normal limits. Donna acute vascular abnormalities are seen. Scattered calcification is noted along the abdominal aorta and its branches.  The appendix is normal in caliber and partially filled with contrast, without evidence for appendicitis. Contrast progresses to the level of the transverse colon.  Scattered diverticulosis is noted along the sigmoid colon. Note is made of increased attenuation within the mid sigmoid colon. This could simply reflect trace extension of oral contrast into the sigmoid colon, though mild acute bleeding into the bowel cannot be entirely excluded given the patient's symptoms.  The bladder is mildly distended and grossly unremarkable. The uterus is unremarkable in appearance. The ovaries are grossly symmetric. Donna suspicious adnexal masses are seen. Donna inguinal lymphadenopathy is seen.  Donna acute osseous abnormalities are identified. Degenerative change is noted at the pubic symphysis. Multilevel vacuum phenomenon is noted along the lumbar spine, with endplate sclerotic change and underlying facet disease.  IMPRESSION: 1. Increased attenuation within the mid sigmoid colon could simply reflect trace extension of oral contrast into the sigmoid colon, though mild acute bleeding into the bowel cannot be entirely excluded given the patient's symptoms. 2. Contrast otherwise progresses to the level of the transverse colon. Scattered diverticulosis noted along the sigmoid colon. 3. Scattered calcification along the abdominal aorta and its branches. 4. Few scattered nonspecific hypodensities in the liver measure up to 9 mm in size and likely reflect small cysts. 5. Nonspecific prominence of the pancreatic duct, measuring up to 4 mm, without definite evidence of distal obstruction. This may simply reflect the patient's baseline. 6. Mild degenerative change noted along the lumbar spine.   Electronically Signed   By: Roanna RaiderJeffery  Chang M.D.   On: 07/23/2014 19:23    Scheduled Meds: . pantoprazole  (PROTONIX) IV  40 mg Intravenous Q12H   Continuous Infusions: . sodium chloride 75 mL/hr at 07/24/14 0744   Antibiotics Given (last 72 hours)    None      Active Problems:   Rectal bleeding   Hypertension   Acute blood loss anemia   Hyperlipidemia   Rectal bleed    Time spent: 35min    P[facility] County Memorial HospitalJOSEPH,Reema Chick  [facility] Pager [phone]. If 7PM-7AM, please contact night-coverage at [email or web address], password [email or web address] 07/24/2014, 11:09 AM  LOS: 1 day

## 2014-07-24 NOTE — Care Management Note (Signed)
Case Management Note  Patient Details  Name: Donna Callahan MRN: [id number] Date of Birth: March 29, 1930  Subjective/Objective:                  Admitted from home with rectal bleeding.   Action/Plan: Return to home when medically stable. CM to f/u with d/cneeds.  Expected Discharge Date:                  Expected Discharge Plan:  Home/Self Care  In-House Referral:     Discharge planning Services  CM Consult  Post Acute Care Choice:    Choice offered to:     DME Arranged:    DME Agency:     HH Arranged:    HH Agency:     Status of Service:  In process, will continue to follow  Medicare Important Message Given:    Date Medicare IM Given:    Medicare IM give by:    Date Additional Medicare IM Given:    Additional Medicare Important Message give by:     If discussed at Long Length of Stay Meetings, dates discussed:    Additional Comments: Marcheta GrammesSandra Smith (Daughter)  [phone]  Epifanio LeschesCole, Dimitra Woodstock Hudson, RN 07/24/2014, 3:54 PM

## 2014-07-24 NOTE — Progress Notes (Signed)
Utilization review completed. Kamarius Buckbee, RN, BSN. 

## 2014-07-25 DIAGNOSIS — K625 Hemorrhage of anus and rectum: Secondary | ICD-10-CM

## 2014-07-25 DIAGNOSIS — E785 Hyperlipidemia, unspecified: Secondary | ICD-10-CM

## 2014-07-25 DIAGNOSIS — D62 Acute posthemorrhagic anemia: Secondary | ICD-10-CM

## 2014-07-25 DIAGNOSIS — I10 Essential (primary) hypertension: Secondary | ICD-10-CM

## 2014-07-25 LAB — CBC
HCT: 33 % — ABNORMAL LOW (ref 36.0–46.0)
HCT: 28.3 % — ABNORMAL LOW (ref 36.0–46.0)
HEMATOCRIT: 31.9 % — AB (ref 36.0–46.0)
HEMOGLOBIN: 10.7 g/dL — AB (ref 12.0–15.0)
Hemoglobin: 10.4 g/dL — ABNORMAL LOW (ref 12.0–15.0)
Hemoglobin: 9.2 g/dL — ABNORMAL LOW (ref 12.0–15.0)
MCH: 28 pg (ref 26.0–34.0)
MCH: 28.1 pg (ref 26.0–34.0)
MCH: 27.8 pg (ref 26.0–34.0)
MCHC: 32.4 g/dL (ref 30.0–36.0)
MCHC: 32.6 g/dL (ref 30.0–36.0)
MCHC: 32.5 g/dL (ref 30.0–36.0)
MCV: 86.4 fL (ref 78.0–100.0)
MCV: 86.2 fL (ref 78.0–100.0)
MCV: 85.5 fL (ref 78.0–100.0)
PLATELETS: 229 10*3/uL (ref 150–400)
Platelets: 228 10*3/uL (ref 150–400)
Platelets: 240 10*3/uL (ref 150–400)
RBC: 3.82 MIL/uL — ABNORMAL LOW (ref 3.87–5.11)
RBC: 3.7 MIL/uL — AB (ref 3.87–5.11)
RBC: 3.31 MIL/uL — ABNORMAL LOW (ref 3.87–5.11)
RDW: 13.6 % (ref 11.5–15.5)
RDW: 13.4 % (ref 11.5–15.5)
RDW: 13.5 % (ref 11.5–15.5)
WBC: 5.1 10*3/uL (ref 4.0–10.5)
WBC: 7 10*3/uL (ref 4.0–10.5)
WBC: 6.7 10*3/uL (ref 4.0–10.5)

## 2014-07-25 MED ORDER — POTASSIUM CHLORIDE CRYS ER 20 MEQ PO TBCR
20.0000 meq | EXTENDED_RELEASE_TABLET | Freq: Once | ORAL | Status: AC
  Administered 2014-07-25: 20 meq via ORAL
  Filled 2014-07-25: qty 1

## 2014-07-25 NOTE — Care Management (Signed)
Important Message  Patient Details  Name: Donna Callahan MRN: [id number] Date of Birth: 1931-01-08   Medicare Important Message Given:  Yes-second notification given    Orson AloeMegan P Naryah Clenney 07/25/2014, 9:59 AM

## 2014-07-25 NOTE — Progress Notes (Signed)
[facility] Progress Note  Subjective: No bowel movements since small stool yesterday morning. No abdominal pain  Objective: Vital signs in last 24 hours: Temp:  [97.9 F (36.6 C)-98.6 F (37 C)] 98.6 F (37 C) (07/06 0510) Pulse Rate:  [69-70] 69 (07/05 1500) Resp:  [14-19] 15 (07/06 0510) BP: (127-148)/(64-91) 141/74 mmHg (07/06 0510) SpO2:  [93 %-96 %] 96 % (07/06 0510) Weight change:    PE: Alert oriented abdomen soft.  Lab Results: Results for orders placed or performed during the hospital encounter of 07/23/14 (from the past 24 hour(s))  CBC     Status: Abnormal   Collection Time: 07/24/14  1:25 PM  Result Value Ref Range   WBC 4.9 4.0 - 10.5 K/uL   RBC 3.43 (L) 3.87 - 5.11 MIL/uL   Hemoglobin 9.7 (L) 12.0 - 15.0 g/dL   HCT 16.129.8 (L) 09.636.0 - 04.546.0 %   MCV 86.9 78.0 - 100.0 fL   MCH 28.3 26.0 - 34.0 pg   MCHC 32.6 30.0 - 36.0 g/dL   RDW 40.913.7 81.111.5 - 91.415.5 %   Platelets 219 150 - 400 K/uL  CBC     Status: Abnormal   Collection Time: 07/24/14  9:39 PM  Result Value Ref Range   WBC 4.6 4.0 - 10.5 K/uL   RBC 3.53 (L) 3.87 - 5.11 MIL/uL   Hemoglobin 9.9 (L) 12.0 - 15.0 g/dL   HCT 78.230.5 (L) 95.636.0 - 21.346.0 %   MCV 86.4 78.0 - 100.0 fL   MCH 28.0 26.0 - 34.0 pg   MCHC 32.5 30.0 - 36.0 g/dL   RDW 08.613.5 57.811.5 - 46.915.5 %   Platelets 214 150 - 400 K/uL  CBC     Status: Abnormal   Collection Time: 07/25/14  5:20 AM  Result Value Ref Range   WBC 5.1 4.0 - 10.5 K/uL   RBC 3.82 (L) 3.87 - 5.11 MIL/uL   Hemoglobin 10.7 (L) 12.0 - 15.0 g/dL   HCT 62.933.0 (L) 52.836.0 - 41.346.0 %   MCV 86.4 78.0 - 100.0 fL   MCH 28.0 26.0 - 34.0 pg   MCHC 32.4 30.0 - 36.0 g/dL   RDW 24.413.6 01.011.5 - 27.215.5 %   Platelets 228 150 - 400 K/uL    Studies/Results: Ct Abdomen Pelvis W Contrast  07/23/2014   CLINICAL DATA:  Acute onset of rectal bleeding.  Initial encounter.  EXAM: CT ABDOMEN AND PELVIS WITH CONTRAST  TECHNIQUE: Multidetector CT imaging of the abdomen and pelvis was performed using the standard  protocol following bolus administration of intravenous contrast.  CONTRAST:  100mL OMNIPAQUE IOHEXOL 300 MG/ML  SOLN  COMPARISON:  None.  FINDINGS: Minimal left basilar atelectasis or scarring noted.  A few scattered nonspecific hypodensities within the liver measure up to 9 mm in size and likely reflect small cysts. The spleen is unremarkable in appearance. The gallbladder is within normal limits. There is nonspecific prominence of the pancreatic duct, measuring up to 4 mm, without definite evidence for distal obstruction.  The kidneys are unremarkable in appearance. There is no evidence of hydronephrosis. No renal or ureteral stones are seen. No perinephric stranding is appreciated.  No free fluid is identified. The small bowel is unremarkable in appearance. The stomach is within normal limits. No acute vascular abnormalities are seen. Scattered calcification is noted along the abdominal aorta and its branches.  The appendix is normal in caliber and partially filled with contrast, without evidence for appendicitis. Contrast progresses to the level of the  transverse colon.  Scattered diverticulosis is noted along the sigmoid colon. Note is made of increased attenuation within the mid sigmoid colon. This could simply reflect trace extension of oral contrast into the sigmoid colon, though mild acute bleeding into the bowel cannot be entirely excluded given the patient's symptoms.  The bladder is mildly distended and grossly unremarkable. The uterus is unremarkable in appearance. The ovaries are grossly symmetric. No suspicious adnexal masses are seen. No inguinal lymphadenopathy is seen.  No acute osseous abnormalities are identified. Degenerative change is noted at the pubic symphysis. Multilevel vacuum phenomenon is noted along the lumbar spine, with endplate sclerotic change and underlying facet disease.  IMPRESSION: 1. Increased attenuation within the mid sigmoid colon could simply reflect trace extension of oral  contrast into the sigmoid colon, though mild acute bleeding into the bowel cannot be entirely excluded given the patient's symptoms. 2. Contrast otherwise progresses to the level of the transverse colon. Scattered diverticulosis noted along the sigmoid colon. 3. Scattered calcification along the abdominal aorta and its branches. 4. Few scattered nonspecific hypodensities in the liver measure up to 9 mm in size and likely reflect small cysts. 5. Nonspecific prominence of the pancreatic duct, measuring up to 4 mm, without definite evidence of distal obstruction. This may simply reflect the patient's baseline. 6. Mild degenerative change noted along the lumbar spine.   Electronically Signed   By: Roanna Raider M.D.   On: 07/23/2014 19:23      Assessment: Lower GI bleed most likely diverticular, non-destabilizing, no bowel movement in 24 hours, hemoglobin stable  Plan: [place] diet today, if stoolsLess bloody in hemoglobin stable, home tomorrow with follow-up with Dr. Matthias Hughs    Rayley Gao C 07/25/2014, 7:50 AM  Pager [phone] If no answer or after 5 PM call [phone]

## 2014-07-25 NOTE — Progress Notes (Signed)
PROGRESS NOTE  Donna Callahan:[id number] DOB: November 26, 1930 DOA: 07/23/2014 PCP: Ezequiel Kayser, MD   Brief history 81 year old female with a history of hypertension, hyperlipidemia, diverticulosis presented with rectal bleeding on the morning of 07/23/2014. CT of the abdomen and pelvis in the emergency department showed diverticulosis with possible bleeding. The patient remained hemodynamically stable and was monitored in the stepdown unit initially. She continued to have some small bloody bowel movements, but they haven't decreased in volume. Gastroenterology was consulted and recommended close monitoring. Aspirin was held. The patient had colonoscopy in 2013 which showed sigmoid diverticulosis. Assessment/Plan: Lower GI bleed -Likely due to diverticular bleed -No bowel movement in the past 24 hours -Hemoglobin stable -Last colonoscopy 2013--sigmoid diverticulosis -Hold aspirin--restart aspirin when Okay with with GI -Appreciate Dr. Madilyn Fireman -Continue to monitor hemoglobin  -Advance diet  Acute blood loss anemia  -Hemoglobin has remained largely stable  -Hematochezia has decreased  Hypertension  -Continue diltiazem  -Avapro and Maxzide on hold presently --continue to monitor blood pressure  -IV hydralazine when necessary SBP greater than 180  Hyperlipidemia  -Restart simvastatin when able to tolerate po     Family Communication:   daughter updated  at beside Disposition Plan:   Home when medically stable      Procedures/Studies: Ct Abdomen Pelvis W Contrast  07/23/2014   CLINICAL DATA:  Acute onset of rectal bleeding.  Initial encounter.  EXAM: CT ABDOMEN AND PELVIS WITH CONTRAST  TECHNIQUE: Multidetector CT imaging of the abdomen and pelvis was performed using the standard protocol following bolus administration of intravenous contrast.  CONTRAST:  OMNIPAQUE IOHEXOL 300 MG/ML  SOLN  COMPARISON:  None.  FINDINGS: Minimal left basilar atelectasis or scarring noted.   A few scattered nonspecific hypodensities within the liver measure up to 9 mm in size and likely reflect small cysts. The spleen is unremarkable in appearance. The gallbladder is within normal limits. There is nonspecific prominence of the pancreatic duct, measuring up to 4 mm, without definite evidence for distal obstruction.  The kidneys are unremarkable in appearance. There is no evidence of hydronephrosis. No renal or ureteral stones are seen. No perinephric stranding is appreciated.  No free fluid is identified. The small bowel is unremarkable in appearance. The stomach is within normal limits. No acute vascular abnormalities are seen. Scattered calcification is noted along the abdominal aorta and its branches.  The appendix is normal in caliber and partially filled with contrast, without evidence for appendicitis. Contrast progresses to the level of the transverse colon.  Scattered diverticulosis is noted along the sigmoid colon. Note is made of increased attenuation within the mid sigmoid colon. This could simply reflect trace extension of oral contrast into the sigmoid colon, though mild acute bleeding into the bowel cannot be entirely excluded given the patient's symptoms.  The bladder is mildly distended and grossly unremarkable. The uterus is unremarkable in appearance. The ovaries are grossly symmetric. No suspicious adnexal masses are seen. No inguinal lymphadenopathy is seen.  No acute osseous abnormalities are identified. Degenerative change is noted at the pubic symphysis. Multilevel vacuum phenomenon is noted along the lumbar spine, with endplate sclerotic change and underlying facet disease.  IMPRESSION: 1. Increased attenuation within the mid sigmoid colon could simply reflect trace extension of oral contrast into the sigmoid colon, though mild acute bleeding into the bowel cannot be entirely excluded given the patient's symptoms. 2. Contrast otherwise progresses to the level of the transverse  colon.  Scattered diverticulosis noted along the sigmoid colon. 3. Scattered calcification along the abdominal aorta and its branches. 4. Few scattered nonspecific hypodensities in the liver measure up to 9 mm in size and likely reflect small cysts. 5. Nonspecific prominence of the pancreatic duct, measuring up to 4 mm, without definite evidence of distal obstruction. This may simply reflect the patient's baseline. 6. Mild degenerative change noted along the lumbar spine.   Electronically Signed   By: Roanna Raider M.D.   On: 07/23/2014 19:23        Subjective: Patient denies fevers, chills, headache, chest pain, dyspnea, nausea, vomiting, diarrhea, abdominal pain, dysuria, hematuria   Objective: Filed Vitals:   07/24/14 2001 07/24/14 2250 07/25/14 0510 07/25/14 0837  BP:  148/91 141/74   Pulse:      Temp: 98.5 F (36.9 C) 98.4 F (36.9 C) 98.6 F (37 C) 97.9 F (36.6 C)  TempSrc: Oral Oral Oral Oral  Resp:  14 15   Height:      Weight:      SpO2:  96% 96%     Intake/Output Summary (Last 24 hours) at 07/25/14 0845 Last data filed at 07/25/14 0510  Gross per 24 hour  Intake   1410 ml  Output   1200 ml  Net    210 ml   Weight change:  Exam:   General:  Pt is alert, follows commands appropriately, not in acute distress  HEENT: No icterus, No thrush,  [place]/AT  Cardiovascular: RRR, S1/S2, no rubs, no gallops  Respiratory: CTA bilaterally, no wheezing, no crackles, no rhonchi  Abdomen: Soft/+BS, non tender, non distended, no guarding  Extremities: No edema, No lymphangitis, No petechiae, No rashes, no synovitis  Data Reviewed: Basic Metabolic Panel:  Recent Labs Lab 07/23/14 1645 07/24/14 0730  NA 137 142  K 3.5 3.3*  CL 100* 107  CO2 28 27  GLUCOSE 127* 92  BUN 15 9  CREATININE 0.79 0.61  CALCIUM 9.5 8.7*   Liver Function Tests:  Recent Labs Lab 07/23/14 1645 07/24/14 0730  AST 23 17  ALT 19 17  ALKPHOS 73 66  BILITOT 0.4 0.5  PROT 7.0 6.0*    ALBUMIN 4.0 3.4*    Recent Labs Lab 07/23/14 1645  LIPASE 22   No results for input(s): AMMONIA in the last 168 hours. CBC:  Recent Labs Lab 07/23/14 1645  07/24/14 0205 07/24/14 0730 07/24/14 1325 07/24/14 2139 07/25/14 0520  WBC 5.6  < > 6.0 4.9 4.9 4.6 5.1  NEUTROABS 3.7  --   --   --   --   --   --   HGB 11.9*  < > 10.1* 10.6* 9.7* 9.9* 10.7*  HCT 36.6  < > 31.3* 31.8* 29.8* 30.5* 33.0*  MCV 88.0  < > 85.5 86.6 86.9 86.4 86.4  PLT 252  < > 203 217 219 214 228  < > = values in this interval not displayed. Cardiac Enzymes: No results for input(s): CKTOTAL, CKMB, CKMBINDEX, TROPONINI in the last 168 hours. BNP: Invalid input(s): POCBNP CBG: No results for input(s): GLUCAP in the last 168 hours.  Recent Results (from the past 240 hour(s))  MRSA PCR Screening     Status: None   Collection Time: 07/24/14 12:20 AM  Result Value Ref Range Status   MRSA by PCR NEGATIVE NEGATIVE Final    Comment:        The GeneXpert MRSA Assay (FDA approved for NASAL specimens only), is one component of a  comprehensive MRSA colonization surveillance program. It is not intended to diagnose MRSA infection nor to guide or monitor treatment for MRSA infections.      Scheduled Meds: . diltiazem  240 mg Oral Daily  . pantoprazole (PROTONIX) IV  40 mg Intravenous Q12H  . zolpidem  5 mg Oral Once   Continuous Infusions:    Junetta Hearn, DO  [facility] Pager [phone]  If 7PM-7AM, please contact night-coverage [email or web address] Password [email or web address] 07/25/2014, 8:45 AM   LOS: 2 days

## 2014-07-25 NOTE — Progress Notes (Signed)
Patient to transfer to 5W18 report given to receiving nurse Verdon CumminsJesse all questions answered at this time.  Pt. VSS with no s/s of distress noted.  Patient stable at transfer.

## 2014-07-25 NOTE — Progress Notes (Signed)
Patient had a medium size liquid stool [place] with dark red.  Notified Dr. Arbutus Leasat updated on patient condition, received no new orders at this time.  Will continue to monitor.

## 2014-07-26 DIAGNOSIS — K922 Gastrointestinal hemorrhage, unspecified: Secondary | ICD-10-CM

## 2014-07-26 LAB — CBC
HCT: 30.8 % — ABNORMAL LOW (ref 36.0–46.0)
HEMOGLOBIN: 10 g/dL — AB (ref 12.0–15.0)
MCH: 27.9 pg (ref 26.0–34.0)
MCHC: 32.5 g/dL (ref 30.0–36.0)
MCV: 86 fL (ref 78.0–100.0)
Platelets: 217 10*3/uL (ref 150–400)
RBC: 3.58 MIL/uL — ABNORMAL LOW (ref 3.87–5.11)
RDW: 13.5 % (ref 11.5–15.5)
WBC: 5.7 10*3/uL (ref 4.0–10.5)

## 2014-07-26 LAB — BASIC METABOLIC PANEL
Anion gap: 8 (ref 5–15)
BUN: 8 mg/dL (ref 6–20)
CALCIUM: 8.7 mg/dL — AB (ref 8.9–10.3)
CO2: 27 mmol/L (ref 22–32)
CREATININE: 0.72 mg/dL (ref 0.44–1.00)
Chloride: 104 mmol/L (ref 101–111)
GFR calc non Af Amer: >60 mL/min (ref >60)
Glucose, Bld: 96 mg/dL (ref 65–99)
Potassium: 3.4 mmol/L — ABNORMAL LOW (ref 3.5–5.1)
Sodium: 139 mmol/L (ref 135–145)

## 2014-07-26 MED ORDER — POTASSIUM CHLORIDE CRYS ER 20 MEQ PO TBCR
40.0000 meq | EXTENDED_RELEASE_TABLET | Freq: Once | ORAL | Status: DC
  Filled 2014-07-26: qty 2

## 2014-07-26 NOTE — Discharge Summary (Signed)
Physician Discharge Summary  Donna Callahan ZOX:[id number] DOB: 01-23-1930 DOA: 07/23/2014  PCP: Ezequiel Kayser, MD  Admit date: 07/23/2014 Discharge date: 07/26/2014  Recommendations for Outpatient Follow-up:  1. Pt will need to follow up with PCP in 1-2 weeks post discharge 2. Please obtain BMP and CBC in 1 week  Discharge Diagnoses:  Lower GI bleed -Likely due to diverticular bleed -No bowel movement in the past 24 hours -Hemoglobin stable ~10 -baseline hgb ~11 -Last colonoscopy 2013--sigmoid diverticulosis -Hold aspirin until 08/09/14 per GI recommendations -Appreciate Dr. Hayes-->Follow up with Dr. Matthias Hughs -Advance diet which tolerated Acute blood loss anemia  -Hemoglobin has remained largely stable  -Hematochezia has decreased  Hypertension  -Continue diltiazem  -Avapro and Maxzide on hold --continue to monitor blood pressure  -We will not restart Avapro, Maxzide, potassium supplementation as the patient's blood pressure remained stable with systolic blood pressure 130-140 and diastolic in the 70s -Follow up with primary care provider for further monitoring  Hyperlipidemia  -Restart simvastatin   Discharge Condition: stable  Disposition: stable Follow-up Information    Follow up with Florencia Reasons, MD In 3 weeks.   Specialty:  Gastroenterology   Contact information:   [address] [place] [address] [phone]       Follow up with Rodrigo Ran A, MD In 1 week.   Specialty:  Internal Medicine   Contact information:   [address] [place] [address] [phone]       Diet:heart healthy Wt Readings from Last 3 Encounters:  07/23/14 54.432 kg (120 lb)  05/02/14 56.7 kg (125 lb)  04/25/14 56.972 kg (125 lb 9.6 oz)    History of present illness:  81 year old female with a history of hypertension, hyperlipidemia, diverticulosis presented with rectal bleeding on the morning of 07/23/2014. CT of the abdomen and pelvis in the  emergency department showed diverticulosis with possible bleeding. The patient remained hemodynamically stable and was monitored in the stepdown unit initially. She continued to have some small bloody bowel movements, but they have decreased in volume. Gastroenterology was consulted and recommended close monitoring. Aspirin was held. The patient had colonoscopy in 2013 which showed sigmoid diverticulosis.  The patient remained hemodynamically stable and her hemoglobin remained stable throughout the hospitalization without needing RBC transfusion. The patient was cleared by GI for discharge with follow-up in the outpatient setting. The patient was instructed to hold her aspirin for 2 weeks and to restart it on 08/09/2014.  Consultants: [facility]  Discharge Exam: Filed Vitals:   07/26/14 0514  BP: 133/74  Pulse: 66  Temp: 98.3 F (36.8 C)  Resp: 18   Filed Vitals:   07/25/14 1100 07/25/14 1303 07/25/14 2057 07/26/14 0514  BP:  129/67 105/72 133/74  Pulse:  66 73 66  Temp: 98.4 F (36.9 C) 98.2 F (36.8 C) 98.2 F (36.8 C) 98.3 F (36.8 C)  TempSrc: Oral Oral Oral Oral  Resp:   20 18  Height:      Weight:      SpO2:  99% 98% 95%   General: A&O x 3, NAD, pleasant, cooperative Cardiovascular: RRR, no rub, no gallop, no S3 Respiratory: CTAB, no wheeze, no rhonchi Abdomen:soft, nontender, nondistended, positive bowel sounds Extremities: No edema, No lymphangitis, no petechiae  Discharge Instructions      Discharge Instructions    Diet - low sodium heart healthy    Complete by:  As directed      Discharge instructions    Complete by:  As  directed   Restart Aspirin  on 08/09/14     Increase activity slowly    Complete by:  As directed             Medication List    STOP taking these medications        aspirin 81 MG tablet     irbesartan 150 MG tablet  Commonly known as:  AVAPRO     potassium chloride 20 MEQ packet  Commonly known as:  KLOR-CON      triamterene-hydrochlorothiazide 37.5-25 MG per tablet  Commonly known as:  MAXZIDE-25      TAKE these medications        acetaminophen 500 MG tablet  Commonly known as:  TYLENOL  Take 500-1,000 mg by mouth every morning.     BIOFREEZE ROLL-ON EX  Apply topically.     diltiazem 240 MG 24 hr capsule  Commonly known as:  TIAZAC  Take 240 mg by mouth every morning.     FISH OIL PO  Take 3 tablets by mouth.     GAS-X PO  Take 1 tablet by mouth daily as needed (gas).     loperamide 2 MG capsule  Commonly known as:  IMODIUM  Take by mouth as needed for diarrhea or loose stools.     multivitamin tablet  Take 1 tablet by mouth daily.     psyllium 58.6 % powder  Commonly known as:  METAMUCIL  Take 1 packet by mouth at bedtime as needed (for constipation).     simvastatin 20 MG tablet  Commonly known as:  ZOCOR  Take 10 mg by mouth at bedtime.     sodium chloride 0.65 % Soln nasal spray  Commonly known as:  OCEAN  Place 1-2 sprays into both nostrils every morning.     SYSTANE OP  Apply 1 drop to eye every morning.     VITAMIN D PO  Take 1 tablet by mouth daily.         The results of significant diagnostics from this hospitalization (including imaging, microbiology, ancillary and laboratory) are listed below for reference.    Significant Diagnostic Studies: Ct Abdomen Pelvis W Contrast  07/23/2014   CLINICAL DATA:  Acute onset of rectal bleeding.  Initial encounter.  EXAM: CT ABDOMEN AND PELVIS WITH CONTRAST  TECHNIQUE: Multidetector CT imaging of the abdomen and pelvis was performed using the standard protocol following bolus administration of intravenous contrast.  CONTRAST:  OMNIPAQUE IOHEXOL 300 MG/ML  SOLN  COMPARISON:  None.  FINDINGS: Minimal left basilar atelectasis or scarring noted.  A few scattered nonspecific hypodensities within the liver measure up to 9 mm in size and likely reflect small cysts. The spleen is unremarkable in appearance. The gallbladder  is within normal limits. There is nonspecific prominence of the pancreatic duct, measuring up to 4 mm, without definite evidence for distal obstruction.  The kidneys are unremarkable in appearance. There is no evidence of hydronephrosis. No renal or ureteral stones are seen. No perinephric stranding is appreciated.  No free fluid is identified. The small bowel is unremarkable in appearance. The stomach is within normal limits. No acute vascular abnormalities are seen. Scattered calcification is noted along the abdominal aorta and its branches.  The appendix is normal in caliber and partially filled with contrast, without evidence for appendicitis. Contrast progresses to the level of the transverse colon.  Scattered diverticulosis is noted along the sigmoid colon. Note is made of increased attenuation within the mid sigmoid  colon. This could simply reflect trace extension of oral contrast into the sigmoid colon, though mild acute bleeding into the bowel cannot be entirely excluded given the patient's symptoms.  The bladder is mildly distended and grossly unremarkable. The uterus is unremarkable in appearance. The ovaries are grossly symmetric. No suspicious adnexal masses are seen. No inguinal lymphadenopathy is seen.  No acute osseous abnormalities are identified. Degenerative change is noted at the pubic symphysis. Multilevel vacuum phenomenon is noted along the lumbar spine, with endplate sclerotic change and underlying facet disease.  IMPRESSION: 1. Increased attenuation within the mid sigmoid colon could simply reflect trace extension of oral contrast into the sigmoid colon, though mild acute bleeding into the bowel cannot be entirely excluded given the patient's symptoms. 2. Contrast otherwise progresses to the level of the transverse colon. Scattered diverticulosis noted along the sigmoid colon. 3. Scattered calcification along the abdominal aorta and its branches. 4. Few scattered nonspecific hypodensities in  the liver measure up to 9 mm in size and likely reflect small cysts. 5. Nonspecific prominence of the pancreatic duct, measuring up to 4 mm, without definite evidence of distal obstruction. This may simply reflect the patient's baseline. 6. Mild degenerative change noted along the lumbar spine.   Electronically Signed   By: Roanna RaiderJeffery  Chang M.D.   On: 07/23/2014 19:23     Microbiology: Recent Results (from the past 240 hour(s))  MRSA PCR Screening     Status: None   Collection Time: 07/24/14 12:20 AM  Result Value Ref Range Status   MRSA by PCR NEGATIVE NEGATIVE Final    Comment:        The GeneXpert MRSA Assay (FDA approved for NASAL specimens only), is one component of a comprehensive MRSA colonization surveillance program. It is not intended to diagnose MRSA infection nor to guide or monitor treatment for MRSA infections.      Labs: Basic Metabolic Panel:  Recent Labs Lab 07/23/14 1645 07/24/14 0730 07/26/14 0533  NA 137 142 139  K 3.5 3.3* 3.4*  CL 100* 107 104  CO2 28 27 27   GLUCOSE 127* 92 96  BUN 15 9 8   CREATININE 0.79 0.61 0.72  CALCIUM 9.5 8.7* 8.7*   Liver Function Tests:  Recent Labs Lab 07/23/14 1645 07/24/14 0730  AST 23 17  ALT 19 17  ALKPHOS 73 66  BILITOT 0.4 0.5  PROT 7.0 6.0*  ALBUMIN 4.0 3.4*    Recent Labs Lab 07/23/14 1645  LIPASE 22   No results for input(s): AMMONIA in the last 168 hours. CBC:  Recent Labs Lab 07/23/14 1645  07/24/14 2139 07/25/14 0520 07/25/14 1501 07/25/14 2225 07/26/14 0533  WBC 5.6  < > 4.6 5.1 7.0 6.7 5.7  NEUTROABS 3.7  --   --   --   --   --   --   HGB 11.9*  < > 9.9* 10.7* 10.4* 9.2* 10.0*  HCT 36.6  < > 30.5* 33.0* 31.9* 28.3* 30.8*  MCV 88.0  < > 86.4 86.4 86.2 85.5 86.0  PLT 252  < > 214 228 240 229 217  < > = values in this interval not displayed. Cardiac Enzymes: No results for input(s): CKTOTAL, CKMB, CKMBINDEX, TROPONINI in the last 168 hours. BNP: Invalid input(s): POCBNP CBG: No  results for input(s): GLUCAP in the last 168 hours.  Time coordinating discharge:  Greater than 30 minutes  Signed:  Shalissa Easterwood, DO [facility] Pager: [phone] 07/26/2014, 8:56 AM

## 2014-07-26 NOTE — Progress Notes (Signed)
Patient discharge teaching given, including activity, diet, follow-up appoints, and medications. Patient verbalized understanding of all discharge instructions. IV access was d/c'd. Vitals are stable. Skin is intact except as charted in most recent assessments. Pt to be escorted out by NT, to be driven home by family. 

## 2014-10-16 ENCOUNTER — Other Ambulatory Visit (HOSPITAL_COMMUNITY): Payer: Self-pay | Admitting: Interventional Radiology

## 2014-10-16 DIAGNOSIS — I8390 Asymptomatic varicose veins of unspecified lower extremity: Secondary | ICD-10-CM

## 2014-10-17 ENCOUNTER — Telehealth: Payer: Self-pay | Admitting: Radiology

## 2014-10-17 NOTE — Telephone Encounter (Signed)
S/p:  EVLT of RLE, 10/26/2013.  Sx have improved.  Also, doing well 6 mo post Right TKR.     Still interested in sclerotherapy of RLE.  Will need 12 mo follow up app't to resubmit to insurance for preauthorization.  Patient to call back when ready to schedule follow up.   Henri [place], [place] 10/17/2014 10:04 AM

## 2015-01-07 ENCOUNTER — Other Ambulatory Visit (HOSPITAL_COMMUNITY): Payer: Self-pay | Admitting: Interventional Radiology

## 2015-01-07 DIAGNOSIS — I8390 Asymptomatic varicose veins of unspecified lower extremity: Secondary | ICD-10-CM

## 2015-02-13 ENCOUNTER — Ambulatory Visit
Admission: RE | Admit: 2015-02-13 | Discharge: 2015-02-13 | Disposition: A | Discharge status: Home / Self Care | Payer: Medicare Other | Source: Ambulatory Visit | Attending: Interventional Radiology | Admitting: Interventional Radiology

## 2015-02-13 ENCOUNTER — Other Ambulatory Visit (HOSPITAL_COMMUNITY): Payer: Self-pay | Admitting: Interventional Radiology

## 2015-02-13 DIAGNOSIS — I8390 Asymptomatic varicose veins of unspecified lower extremity: Secondary | ICD-10-CM

## 2015-02-13 NOTE — Progress Notes (Signed)
Patient ID: Donna Callahan, female   DOB: 11/19/1930, 82 y.o.   MRN: [id number]    Chief Complaint: Venous insufficiency, lower surety varicose veins, status post prior laser treatment.  Referring Physician(s): Perini  History of Present Illness: Donna Callahan is a 82 y.o. female with a history of bilateral GS V venous insufficiency, varicose veins, and associated leg pain and fatigue. She underwent bilateral GS V transcatheter laser occlusions for the venous insufficiency and varicosities. She has recovered from both treatments very well. Both legs are asymptomatic. No residual leg pain, discomfort or fatigue. She remains very active for her age. No physical limitations. Since her last visit, she has undergone knee replacement on the right. She returns to reevaluate the residual calf and tibial small varicose veins.  Past Medical History  Diagnosis Date  . Hypertension   . High cholesterol   . Hard of hearing   . Varicose veins     Past Surgical History  Procedure Laterality Date  . Tonsillectomy    . Stapedectomy      bil ears  . Varicose vein surgery    . Total knee arthroplasty Right 05/02/2014    Procedure: RIGHT TOTAL KNEE ARTHROPLASTY;  Surgeon: Ollen Gross, MD;  Location: [facility];  Service: Orthopedics;  Laterality: Right;    Allergies: Benadryl; Penicillins; and Sulfa antibiotics  Medications: Prior to Admission medications   Medication Sig Start Date End Date Taking? Authorizing Provider  acetaminophen (TYLENOL) 500 MG tablet Take 500-1,000 mg by mouth every morning.    Historical Provider, MD  Cholecalciferol (VITAMIN D PO) Take 1 tablet by mouth daily.    Historical Provider, MD  diltiazem (TIAZAC) 240 MG 24 hr capsule Take 240 mg by mouth every morning.     Historical Provider, MD  loperamide (IMODIUM) 2 MG capsule Take by mouth as needed for diarrhea or loose stools.    Historical Provider, MD  Menthol, Topical Analgesic, (BIOFREEZE ROLL-ON EX) Apply topically.     Historical Provider, MD  Multiple Vitamin (MULTIVITAMIN) tablet Take 1 tablet by mouth daily.    Historical Provider, MD  Omega-3 Fatty Acids (FISH OIL PO) Take 3 tablets by mouth.    Historical Provider, MD  Polyethyl Glycol-Propyl Glycol (SYSTANE OP) Apply 1 drop to eye every morning.    Historical Provider, MD  psyllium (METAMUCIL) 58.6 % powder Take 1 packet by mouth at bedtime as needed (for constipation).     Historical Provider, MD  Simethicone (GAS-X PO) Take 1 tablet by mouth daily as needed (gas).    Historical Provider, MD  simvastatin (ZOCOR) 20 MG tablet Take 10 mg by mouth at bedtime.     Historical Provider, MD  sodium chloride (OCEAN) 0.65 % SOLN nasal spray Place 1-2 sprays into both nostrils every morning.    Historical Provider, MD     Family History  Problem Relation Age of Onset  . Hypertension Mother     Social History   Social History  . Marital Status: Widowed    Spouse Name: [name]  . Number of Children: [name]  . Years of Education: [name]   Social History Main Topics  . Smoking status: Never Smoker   . Smokeless tobacco: Not on file  . Alcohol Use: No  . Drug Use: No  . Sexual Activity: Not on file   Other Topics Concern  . Not on file   Social History Narrative     Review of Systems: A 12 point ROS discussed and pertinent positives  are indicated in the HPI above.  All other systems are negative.  Review of Systems  Constitutional: Negative for fever, diaphoresis, activity change, fatigue and unexpected weight change.  Musculoskeletal: Negative for myalgias.  Skin: Negative for color change and rash.    Vital Signs: There were no vitals taken for this visit.  Physical Exam  Constitutional: She appears well-developed and well-nourished. No distress.  Pleasant elderly female in no distress.  Musculoskeletal:  Small residual sub-surface varicosities in the tibial and calf regions, slightly larger on the right. Skin intact. No ulcerations or  breakdown. No signs of phlebitis or cellulitis.  Skin: She is not diaphoretic.     Imaging: US Venous Img Lower Unilateral Left  02/13/2015  CLINICAL DATA:  Status post prior GSV transcatheter laser occlusion for varicose veins, leg pain and venous insufficiency. EXAM: LEFT LOWER EXTREMITY VENOUS DOPPLER ULTRASOUND TECHNIQUE: Gray-scale sonography with graded compression, as well as color Doppler and duplex ultrasound were performed to evaluate the lower extremity deep venous systems from the level of the common femoral vein and including the common femoral, femoral, profunda femoral, popliteal and calf veins including the posterior tibial, peroneal and gastrocnemius veins when visible. The superficial great saphenous vein was also interrogated. Spectral Doppler was utilized to evaluate flow at rest and with distal augmentation maneuvers in the common femoral, femoral and popliteal veins. COMPARISON:  None. FINDINGS: Contralateral Common Femoral Vein: Respiratory phasicity is normal and symmetric with the symptomatic side. No evidence of thrombus. Normal compressibility. Common Femoral Vein: No evidence of thrombus. Normal compressibility, respiratory phasicity and response to augmentation. Saphenofemoral Junction: No evidence of thrombus. Normal compressibility and flow on color Doppler imaging. Profunda Femoral Vein: No evidence of thrombus. Normal compressibility and flow on color Doppler imaging. Femoral Vein: No evidence of thrombus. Normal compressibility, respiratory phasicity and response to augmentation. Popliteal Vein: No evidence of thrombus. Normal compressibility, respiratory phasicity and response to augmentation. Calf Veins: No evidence of thrombus. Normal compressibility and flow on color Doppler imaging. Superficial Great Saphenous Vein: The left GSV in the thigh region is very small in caliber with intervening areas of partial recanalization but no significant thigh branching varicosities.  The left calf GSV is approximately 1 cm greatest diameter with small sub surface branching varicosities over the anterior tibial region. The left tibial and calf varicosities also appear to communicate with distal perforators and a small left saphenous vein. If these become symptomatic, they would be amenable to ultrasound from sclerotherapy. Venous Reflux:  None. Other Findings:  None. IMPRESSION: Negative for DVT. Intervening segments of the previously treated left GSV have partially recannalized. Left tibial and calf sub surface varicosities communicate with the distal left GSV and SSV as well as small perforators. If symptoms develop, consider ultrasound sclerotherapy. Electronically Signed   By: Judie Petit.  Nikki Rusnak M.D.   On: 02/13/2015 14:53    Labs:  CBC:  Recent Labs  07/25/14 0520 07/25/14 1501 07/25/14 2225 07/26/14 0533  WBC 5.1 7.0 6.7 5.7  HGB 10.7* 10.4* 9.2* 10.0*  HCT 33.0* 31.9* 28.3* 30.8*  PLT 228 240 229 217    COAGS:  Recent Labs  04/25/14 1030 07/23/14 1645  INR 0.99 1.05  APTT 30  --     BMP:  Recent Labs  05/04/14 0455 07/23/14 1645 07/24/14 0730 07/26/14 0533  NA 136 137 142 139  K 4.0 3.5 3.3* 3.4*  CL 102 100* 107 104  CO2 GLUCOSE 116* 127* 92 96  BUN  CALCIUM 8.6 9.5 8.7* 8.7*  CREATININE 0.71 0.79 0.61 0.72  GFRNONAA 79* >60 >60 >60  GFRAA >90 >60 >60 >60    LIVER FUNCTION TESTS:  Recent Labs  04/25/14 1030 07/23/14 1645 07/24/14 0730  BILITOT 0.6 0.4 0.5  AST ALT ALKPHOS 77 73 66  PROT 7.7 7.0 6.0*  ALBUMIN 4.7 4.0 3.4*    Assessment and Plan:  Status post previous GSV transcatheter laser occlusions. No current residual leg pain, fatigue or heaviness. She remains very active. No physical limitations. Residual calf and tibial small varicosities and spider veins are present secondary to residual saphenous venous insufficiency and small perforators. She has been compliant with compression  stockings. At this point, I would recommend continued conservative management.  Plan: Conservative management with compression stockings as much possible, continue daily walking and exercise as well as maintaining a healthy weight. Patient is in agreement with this plan. If symptoms develop, the residual small tibial and calf varicosities could be treated with ultrasound sclerotherapy. Follow-up as needed for any recurrent symptoms..     Electronically Signed: Berdine Dance 02/13/2015, 3:14 PM   I spent a total of    15 Minutes in face to face in clinical consultation, greater than 50% of which was counseling/coordinating care for with venous insufficiency and small varicose veins.

## 2015-02-17 ENCOUNTER — Emergency Department (HOSPITAL_BASED_OUTPATIENT_CLINIC_OR_DEPARTMENT_OTHER): Payer: Medicare Other

## 2015-02-17 ENCOUNTER — Encounter (HOSPITAL_BASED_OUTPATIENT_CLINIC_OR_DEPARTMENT_OTHER): Payer: Self-pay | Admitting: Emergency Medicine

## 2015-02-17 ENCOUNTER — Emergency Department (HOSPITAL_BASED_OUTPATIENT_CLINIC_OR_DEPARTMENT_OTHER)
Admission: EM | Admit: 2015-02-17 | Discharge: 2015-02-17 | Disposition: A | Discharge status: Home / Self Care | Payer: Medicare Other | Attending: Emergency Medicine | Admitting: Emergency Medicine

## 2015-02-17 DIAGNOSIS — S99922A Unspecified injury of left foot, initial encounter: Secondary | ICD-10-CM | POA: Insufficient documentation

## 2015-02-17 DIAGNOSIS — Y9389 Activity, other specified: Secondary | ICD-10-CM | POA: Insufficient documentation

## 2015-02-17 DIAGNOSIS — E7800 Pure hypercholesterolemia, unspecified: Secondary | ICD-10-CM | POA: Insufficient documentation

## 2015-02-17 DIAGNOSIS — S62609A Fracture of unspecified phalanx of unspecified finger, initial encounter for closed fracture: Secondary | ICD-10-CM

## 2015-02-17 DIAGNOSIS — S8992XA Unspecified injury of left lower leg, initial encounter: Secondary | ICD-10-CM | POA: Diagnosis present

## 2015-02-17 DIAGNOSIS — Y9289 Other specified places as the place of occurrence of the external cause: Secondary | ICD-10-CM | POA: Diagnosis not present

## 2015-02-17 DIAGNOSIS — S00531A Contusion of lip, initial encounter: Secondary | ICD-10-CM | POA: Diagnosis not present

## 2015-02-17 DIAGNOSIS — H9190 Unspecified hearing loss, unspecified ear: Secondary | ICD-10-CM | POA: Insufficient documentation

## 2015-02-17 DIAGNOSIS — W19XXXA Unspecified fall, initial encounter: Secondary | ICD-10-CM

## 2015-02-17 DIAGNOSIS — S0081XA Abrasion of other part of head, initial encounter: Secondary | ICD-10-CM | POA: Diagnosis not present

## 2015-02-17 DIAGNOSIS — Z79899 Other long term (current) drug therapy: Secondary | ICD-10-CM | POA: Insufficient documentation

## 2015-02-17 DIAGNOSIS — W010XXA Fall on same level from slipping, tripping and stumbling without subsequent striking against object, initial encounter: Secondary | ICD-10-CM | POA: Insufficient documentation

## 2015-02-17 DIAGNOSIS — M25562 Pain in left knee: Secondary | ICD-10-CM

## 2015-02-17 DIAGNOSIS — Z96651 Presence of right artificial knee joint: Secondary | ICD-10-CM | POA: Insufficient documentation

## 2015-02-17 DIAGNOSIS — Z880 Allergy status to penicillin: Secondary | ICD-10-CM | POA: Insufficient documentation

## 2015-02-17 DIAGNOSIS — S62637A Displaced fracture of distal phalanx of left little finger, initial encounter for closed fracture: Secondary | ICD-10-CM | POA: Diagnosis not present

## 2015-02-17 DIAGNOSIS — Y998 Other external cause status: Secondary | ICD-10-CM | POA: Diagnosis not present

## 2015-02-17 DIAGNOSIS — I10 Essential (primary) hypertension: Secondary | ICD-10-CM | POA: Insufficient documentation

## 2015-02-17 MED ORDER — HYDROCODONE-ACETAMINOPHEN 5-325 MG PO TABS: 1.0000 | ORAL_TABLET | ORAL | Status: DC | PRN

## 2015-02-17 NOTE — ED Provider Notes (Signed)
CSN: [id number]     Arrival date & time 02/17/15  1514 History  By signing my name below, I, Phillis Haggis, attest that this documentation has been prepared under the direction and in the presence of Jerelyn Scott, MD. Electronically Signed: Phillis Haggis, ED Scribe. 02/17/2015. 6:17 PM.  Chief Complaint  Patient presents with  . Fall   Patient is a 82 y.o. female presenting with fall. The history is provided by the patient. No language interpreter was used.  Fall This is a new problem. The current episode started 3 to 5 hours ago. The problem occurs constantly. The problem has been gradually worsening. She has tried nothing for the symptoms.  HPI Comments: GEARLDENE FIORENZA is a 82 y.o. Female with a hx of HTN and right knee replacement who presents to the Emergency Department complaining of a fall onset 4 hours ago. Pt states that she tripped on uneven pavement today, impacting her right knee, left 1st toe, left 5th finger, and right upper cheek. She states that she also cut the inside of her upper lip. She says that the pain is worst in the right knee. She states that she is able to bear weight, but it is painful. She denies taking anything for her symptoms. She denies LOC, numbness, or weakness.  Past Medical History  Diagnosis Date  . Hypertension   . High cholesterol   . Hard of hearing   . Varicose veins    Past Surgical History  Procedure Laterality Date  . Tonsillectomy    . Stapedectomy      bil ears  . Varicose vein surgery    . Total knee arthroplasty Right 05/02/2014    Procedure: RIGHT TOTAL KNEE ARTHROPLASTY;  Surgeon: Ollen Gross, MD;  Location: [facility];  Service: Orthopedics;  Laterality: Right;   Family History  Problem Relation Age of Onset  . Hypertension Mother    Social History  Substance Use Topics  . Smoking status: Never Smoker   . Smokeless tobacco: None  . Alcohol Use: No   OB History    No data available     Review of Systems  Musculoskeletal:  Positive for arthralgias.  Neurological: Negative for syncope, weakness and numbness.  All other systems reviewed and are negative.  Allergies  Benadryl; Penicillins; and Sulfa antibiotics  Home Medications   Prior to Admission medications   Medication Sig Start Date End Date Taking? Authorizing Provider  pantoprazole (PROTONIX) 20 MG tablet Take 20 mg by mouth daily.   Yes Historical Provider, MD  acetaminophen (TYLENOL) 500 MG tablet Take 500-1,000 mg by mouth every morning.    Historical Provider, MD  Cholecalciferol (VITAMIN D PO) Take 1 tablet by mouth daily.    Historical Provider, MD  diltiazem (TIAZAC) 240 MG 24 hr capsule Take 240 mg by mouth every morning.     Historical Provider, MD  HYDROcodone-acetaminophen (NORCO/VICODIN) 5-325 MG tablet Take 1 tablet by mouth every 4 (four) hours as needed. 02/17/15   Jerelyn Scott, MD  loperamide (IMODIUM) 2 MG capsule Take by mouth as needed for diarrhea or loose stools.    Historical Provider, MD  Menthol, Topical Analgesic, (BIOFREEZE ROLL-ON EX) Apply topically.    Historical Provider, MD  Multiple Vitamin (MULTIVITAMIN) tablet Take 1 tablet by mouth daily.    Historical Provider, MD  Omega-3 Fatty Acids (FISH OIL PO) Take 3 tablets by mouth.    Historical Provider, MD  Polyethyl Glycol-Propyl Glycol (SYSTANE OP) Apply 1 drop to eye every  morning.    Historical Provider, MD  psyllium (METAMUCIL) 58.6 % powder Take 1 packet by mouth at bedtime as needed (for constipation).     Historical Provider, MD  Simethicone (GAS-X PO) Take 1 tablet by mouth daily as needed (gas).    Historical Provider, MD  simvastatin (ZOCOR) 20 MG tablet Take 10 mg by mouth at bedtime.     Historical Provider, MD  sodium chloride (OCEAN) 0.65 % SOLN nasal spray Place 1-2 sprays into both nostrils every morning.    Historical Provider, MD   BP 176/72 mmHg  Pulse 58  Temp(Src) 98.1 F (36.7 C) (Oral)  Resp 18  Ht  (1.6 m)  Wt 125 lb (56.7 kg)  BMI 22.15  kg/m2  SpO2 97%  Vitals reviewed Physical Exam  Physical Examination: General appearance - alert, well appearing, and in no distress Mental status - alert, oriented to person, place, and time Eyes - no conjunctival injection no scleral icterus Neck - no midline tenderness to palpation, FROM without pain Mouth- no loose teeth, mild abrasion of right upper lip and bruising in internal upp erlip Chest - clear to auscultation, no wheezes, rales or rhonchi, symmetric air entry Heart - normal rate, regular rhythm, normal S1, S2, no murmurs, rubs, clicks or gallops Abdomen - soft, nontender, nondistended, no masses or organomegaly Neurological - alert, oriented, normal speech Extremities - ttp over right patella, ttp over left distal 5th finger with bruising and mild swelling, finger and right LE distally NVI, otherwise peripheral pulses normal, no pedal edema, no clubbing or cyanosis Skin - normal coloration and turgor, no rashes, superficial abrasion to right cheek and bruising of right upper lip  ED Course  Procedures (including critical care time) DIAGNOSTIC STUDIES: Oxygen Saturation is 98% on RA, normal by my interpretation.    COORDINATION OF CARE: 5:18 PM-Discussed treatment plan which includes x-ray and follow up with orthopedist if necessary with pt at bedside and pt agreed to plan.    Labs Review Labs Reviewed - No data to display  Imaging Review Dg Knee Complete 4 Views Right  02/17/2015  CLINICAL DATA:  Fall with injury to the right knee today. Knee pain. EXAM: RIGHT KNEE - COMPLETE 4+ VIEW COMPARISON:  Report from 05/07/2013 FINDINGS: Total knee prosthesis in place. I do not observe a periprosthetic fracture or abnormality along the metal -methacrylate or methacrylate -bone interfaces. None of the components seem out of place. There is likely some prepatellar subcutaneous edema. IMPRESSION: 1. Prepatellar subcutaneous edema. 2. I do not observe a fracture or an acute complication  related to the implant. Electronically Signed   By: Gaylyn Rong M.D.   On: 02/17/2015 16:25   Dg Finger Little Left  02/17/2015  CLINICAL DATA:  Tripped and fell today.  Pain and little finger. EXAM: LEFT LITTLE FINGER 2+V COMPARISON:  None available. FINDINGS: Acute avulsion fractures post along the dorsal aspect of the DIP joint. Small dinner site is evident from the distal phalanx. There is soft tissue swelling over the fracture. No additional fractures are present. Tracts are otherwise located. IMPRESSION: Acute avulsion fracture along the dorsal aspect of the DIP joint. Electronically Signed   By: Marin Roberts M.D.   On: 02/17/2015 18:11   I have personally reviewed and evaluated these images and lab results as part of my medical decision-making.   EKG Interpretation None      MDM   Final diagnoses:  Fall, initial encounter  Knee pain, acute, left  Finger fracture, closed, initial encounter    Pt presenting with c/o knee pain and left 5th finger pain after fall.  She has abrasions of her face as well but no bony point tenderness- no LOC, no vomiting, no seizure activity- no indication for imaging of head at this time- does not take blood thinners.  Xray of knee is reassuring.  Xray of finger shows avulsion fracture.  Finger placed in splint.  Given rx for hydrocodone.  Discharged with strict return precautions.  Pt agreeable with plan.   I personally performed the services described in this documentation, which was scribed in my presence. The recorded information has been reviewed and is accurate.     Jerelyn Scott, MD 02/17/15 [phone]

## 2015-02-17 NOTE — ED Notes (Signed)
Pt tripped on uneven pavement today after lunch, landing on right knee, left 1st toe, left 5th finger and right upper cheek.  Pain worse in right knee.

## 2015-02-17 NOTE — Discharge Instructions (Signed)
Return to the ED with any concerns including increased pain/discoloration/numbness of finger, vomiting, seizure activity, decreased level of alertness/lethargy, or any other alarming symptoms

## 2015-02-23 DIAGNOSIS — M20012 Mallet finger of left finger(s): Secondary | ICD-10-CM | POA: Insufficient documentation

## 2015-09-12 ENCOUNTER — Other Ambulatory Visit: Payer: Self-pay | Admitting: Vascular Surgery

## 2015-09-12 DIAGNOSIS — I8393 Asymptomatic varicose veins of bilateral lower extremities: Secondary | ICD-10-CM

## 2015-11-20 ENCOUNTER — Encounter: Payer: Self-pay | Admitting: Vascular Surgery

## 2015-11-25 ENCOUNTER — Encounter: Payer: Self-pay | Admitting: Vascular Surgery

## 2015-11-25 ENCOUNTER — Ambulatory Visit (INDEPENDENT_AMBULATORY_CARE_PROVIDER_SITE_OTHER): Payer: Medicare Other | Admitting: Vascular Surgery

## 2015-11-25 ENCOUNTER — Ambulatory Visit (HOSPITAL_COMMUNITY)
Admission: RE | Admit: 2015-11-25 | Discharge: 2015-11-25 | Disposition: A | Discharge status: Home / Self Care | Payer: Medicare Other | Source: Ambulatory Visit | Attending: Vascular Surgery | Admitting: Vascular Surgery

## 2015-11-25 VITALS — BP 131/75 | HR 60 | Temp 97.1°F | Resp 18 | Ht 63.0 in | Wt 130.5 lb

## 2015-11-25 DIAGNOSIS — I83893 Varicose veins of bilateral lower extremities with other complications: Secondary | ICD-10-CM | POA: Diagnosis not present

## 2015-11-25 DIAGNOSIS — I83892 Varicose veins of left lower extremities with other complications: Secondary | ICD-10-CM | POA: Insufficient documentation

## 2015-11-25 DIAGNOSIS — I8393 Asymptomatic varicose veins of bilateral lower extremities: Secondary | ICD-10-CM | POA: Diagnosis not present

## 2015-11-25 DIAGNOSIS — R609 Edema, unspecified: Secondary | ICD-10-CM | POA: Diagnosis present

## 2015-11-25 NOTE — Progress Notes (Signed)
Subjective:     Patient ID: Donna Callahan, female   DOB: 1930-09-09, 82 y.o.   MRN: [id number]  HPI This 82 year old female was referred by Dr. Waynard EdwardsPerini for evaluation of painful varicosities in both lower extremity's. Patient has a history of bilateral laser [facility] of [facility] Scheck-left leg in 2007 in right leg 2 years ago. Patient did not have stab phlebectomy of residual varicosities performed. She states she did return to be evaluated for that but it was not recommended. She states that since that time the varicosities have slowly progressed. She does not have distal edema but does develop aching and throbbing discomfort throughout the day. She does wear long leg elastic compression stockings 20-30 millimeter gradient. She has no history of DVT thrombophlebitis stasis ulcers or bleeding. Symptoms continued to worsen.  Past Medical History:  Diagnosis Date  . Hard of hearing   . High cholesterol   . Hypertension   . Varicose veins     Social History  Substance Use Topics  . Smoking status: Never Smoker  . Smokeless tobacco: Not on file  . Alcohol use No    Family History  Problem Relation Age of Onset  . Hypertension Mother     Allergies  Allergen Reactions  . Benadryl [Diphenhydramine Hcl (Sleep)] Other (See Comments)    Paradoxical response, hyperactivity  . Penicillins Hives  . Sulfa Antibiotics Nausea And Vomiting     Current Outpatient Prescriptions:  .  acetaminophen (TYLENOL) 500 MG tablet, Take 500-1,000 mg by mouth every morning., Disp: , Rfl:  .  BIOTIN PO, Take by mouth., Disp: , Rfl:  .  Cholecalciferol (VITAMIN D PO), Take 1 tablet by mouth daily., Disp: , Rfl:  .  Coenzyme Q10 (COQ10) 100 MG CAPS, Take by mouth., Disp: , Rfl:  .  diltiazem (TIAZAC) 240 MG 24 hr capsule, Take 240 mg by mouth every morning. , Disp: , Rfl:  .  irbesartan (AVAPRO) 150 MG tablet, Take 150 mg by mouth daily., Disp: , Rfl:  .  loperamide  (IMODIUM) 2 MG capsule, Take by mouth as needed for diarrhea or loose stools., Disp: , Rfl:  .  Multiple Vitamin (MULTIVITAMIN) tablet, Take 1 tablet by mouth daily., Disp: , Rfl:  .  mupirocin ointment (BACTROBAN) 2 %, Place 1 application into the nose 2 (two) times daily., Disp: , Rfl:  .  potassium chloride SA (KLOR-CON M20) 20 MEQ tablet, TAKE 3 TABLETS BY MOUTH EVERY DAY, Disp: , Rfl:  .  psyllium (METAMUCIL) 58.6 % powder, Take 1 packet by mouth at bedtime as needed (for constipation). , Disp: , Rfl:  .  simvastatin (ZOCOR) 20 MG tablet, Take 10 mg by mouth at bedtime. , Disp: , Rfl:  .  triamterene-hydrochlorothiazide (MAXZIDE-25) 37.5-25 MG tablet, TAKE 1 TABLET BY MOUTH ONCE DAILY, Disp: , Rfl:  .  HYDROcodone-acetaminophen (NORCO/VICODIN) 5-325 MG tablet, Take 1 tablet by mouth every 4 (four) hours as needed. (Patient not taking: Reported on 11/25/2015), Disp: 12 tablet, Rfl: 0 .  Menthol, Topical Analgesic, (BIOFREEZE ROLL-ON EX), Apply topically., Disp: , Rfl:  .  Omega-3 Fatty Acids (FISH OIL PO), Take 3 tablets by mouth., Disp: , Rfl:  .  pantoprazole (PROTONIX) 20 MG tablet, Take 20 mg by mouth daily., Disp: , Rfl:  .  Polyethyl Glycol-Propyl Glycol (SYSTANE OP), Apply 1 drop to eye every morning., Disp: , Rfl:  .  Simethicone (GAS-X PO), Take 1 tablet by mouth daily as needed (  gas)., Disp: , Rfl:  .  sodium chloride (OCEAN) 0.65 % SOLN nasal spray, Place 1-2 sprays into both nostrils every morning., Disp: , Rfl:   Vitals:   11/25/15 1405  BP: 131/75  Pulse: 60  Resp: 18  Temp: 97.1 F (36.2 C)  TempSrc: Oral  SpO2: 96%  Weight: 130 lb 8 oz (59.2 kg)  Height: 5\' 3"  (1.6 m)    Body mass index is 23.12 kg/m.         Review of Systems Denies chest pain, dyspnea on exertion, PND, orthopnea, hemoptysis. Patient is hard of hearing and has hypertension. Other systems negative and a complete review of systems    Objective:   Physical Exam BP 131/75 (BP Location: Left  Arm, Patient Position: Sitting, Cuff Size: Normal)   Pulse 60   Temp 97.1 F (36.2 C) (Oral)   Resp 18   Ht 5\' 3"  (1.6 m)   Wt 130 lb 8 oz (59.2 kg)   SpO2 96%   BMI 23.12 kg/m     Gen.-alert and oriented x3 in no apparent distress-patient appears younger than her stated age HEENT normal for age Lungs no rhonchi or wheezing Cardiovascular regular rhythm no murmurs carotid pulses 3+ palpable no bruits audible Abdomen soft nontender no palpable masses Musculoskeletal free of  major deformities Skin clear -no rashes Neurologic normal Lower extremities 3+ femoral and dorsalis pedis pulses palpable bilaterally with no edema Extensive bulging varicosities in both lower extremities. Right leg begins and anterior mid thigh extending across the patella into the lateral and medial calf. Left leg extends down the medial thigh medial calf and into the posterior thigh and calf. Some varicosities have quite thin skin superficially. No hyperpigmentation or ulceration noted.  Today I ordered a venous duplex exam both legs which I reviewed and interpreted. There is no DVT. There is absent great saphenous veins bilaterally and the small saphenous veins are not large and have no reflux on the right and there is reflux on the left.       Assessment:     Residual painful varicosities bilaterally following previous laser [facility] in [facility] and 2015. Gross reflux left small saphenous vein with painful varicosities Hypertension Status post right knee replacement    Plan:         #1 long leg elastic compression stockings 20-30 mm gradient #2 elevate legs as much as possible #3 ibuprofen daily on a regular basis for pain #4 return in 3 months-if no significant improvement then we will examine the left small saphenous vein to see if it appears to be a candidate for laser ablation. Patient will then be evaluated for possible multiple stab  phlebectomy-greater than 20 of both lower extremities to relieve her symptoms Return in 3 months

## 2016-02-18 ENCOUNTER — Encounter: Payer: Self-pay | Admitting: Vascular Surgery

## 2016-02-25 ENCOUNTER — Encounter: Payer: Self-pay | Admitting: Vascular Surgery

## 2016-02-25 ENCOUNTER — Ambulatory Visit (INDEPENDENT_AMBULATORY_CARE_PROVIDER_SITE_OTHER): Payer: Medicare PPO | Admitting: Vascular Surgery

## 2016-02-25 VITALS — BP 128/79 | HR 73 | Temp 97.7°F | Resp 18 | Ht 63.0 in | Wt 130.6 lb

## 2016-02-25 DIAGNOSIS — I83893 Varicose veins of bilateral lower extremities with other complications: Secondary | ICD-10-CM

## 2016-02-25 NOTE — Progress Notes (Signed)
Subjective:     Patient ID: Donna Callahan, female   DOB: September 25, 1930, 83 y.o.   MRN: [id number]  HPI This 83 year old female returns 3 months post-previous evaluation for painful varicosities in both lower extremities. She is tried long-leg elastic compression stockings 20-30 millimeter gradient as well as elevation and ibuprofen continues to have aching throbbing and burning discomfort throughout both legs. She had bilateral laser ablations by Dr. Sallyanne HaversScheck the past several years but had no stab phlebectomy's associated with these. Her symptoms are affecting her daily living and she would like treatment.  Past Medical History:  Diagnosis Date  . Hard of hearing   . High cholesterol   . Hypertension   . Varicose veins     Social History  Substance Use Topics  . Smoking status: Never Smoker  . Smokeless tobacco: Never Used  . Alcohol use No    Family History  Problem Relation Age of Onset  . Hypertension Mother     Allergies  Allergen Reactions  . Benadryl [Diphenhydramine Hcl (Sleep)] Other (See Comments)    Paradoxical response, hyperactivity  . Penicillins Hives  . Sulfa Antibiotics Nausea And Vomiting     Current Outpatient Prescriptions:  .  acetaminophen (TYLENOL) 500 MG tablet, Take 500-1,000 mg by mouth every morning., Disp: , Rfl:  .  BIOTIN PO, Take by mouth., Disp: , Rfl:  .  Cholecalciferol (VITAMIN D PO), Take 1 tablet by mouth daily., Disp: , Rfl:  .  Coenzyme Q10 (COQ10) 100 MG CAPS, Take by mouth., Disp: , Rfl:  .  diltiazem (TIAZAC) 240 MG 24 hr capsule, Take 240 mg by mouth every morning. , Disp: , Rfl:  .  irbesartan (AVAPRO) 150 MG tablet, Take 150 mg by mouth daily., Disp: , Rfl:  .  loperamide (IMODIUM) 2 MG capsule, Take by mouth as needed for diarrhea or loose stools., Disp: , Rfl:  .  Menthol, Topical Analgesic, (BIOFREEZE ROLL-ON EX), Apply topically., Disp: , Rfl:  .  Multiple Vitamin (MULTIVITAMIN) tablet, Take 1 tablet by mouth daily., Disp: , Rfl:   .  mupirocin ointment (BACTROBAN) 2 %, Place 1 application into the nose 2 (two) times daily., Disp: , Rfl:  .  Omega-3 Fatty Acids (FISH OIL PO), Take 3 tablets by mouth., Disp: , Rfl:  .  pantoprazole (PROTONIX) 20 MG tablet, Take 20 mg by mouth daily., Disp: , Rfl:  .  Polyethyl Glycol-Propyl Glycol (SYSTANE OP), Apply 1 drop to eye every morning., Disp: , Rfl:  .  potassium chloride SA (KLOR-CON M20) 20 MEQ tablet, TAKE 3 TABLETS BY MOUTH EVERY DAY, Disp: , Rfl:  .  psyllium (METAMUCIL) 58.6 % powder, Take 1 packet by mouth at bedtime as needed (for constipation). , Disp: , Rfl:  .  Simethicone (GAS-X PO), Take 1 tablet by mouth daily as needed (gas)., Disp: , Rfl:  .  simvastatin (ZOCOR) 20 MG tablet, Take 10 mg by mouth at bedtime. , Disp: , Rfl:  .  sodium chloride (OCEAN) 0.65 % SOLN nasal spray, Place 1-2 sprays into both nostrils every morning., Disp: , Rfl:  .  triamterene-hydrochlorothiazide (MAXZIDE-25) 37.5-25 MG tablet, TAKE 1 TABLET BY MOUTH ONCE DAILY, Disp: , Rfl:  .  HYDROcodone-acetaminophen (NORCO/VICODIN) 5-325 MG tablet, Take 1 tablet by mouth every 4 (four) hours as needed. (Patient not taking: Reported on 11/25/2015), Disp: 12 tablet, Rfl: 0  Vitals:   02/25/16 1121  BP: 128/79  Pulse: 73  Resp: 18  Temp: 97.7 F (36.5  C)  TempSrc: Oral  SpO2: 96%  Weight: 130 lb 9.6 oz (59.2 kg)  Height: 5\' 3"  (1.6 m)    Body mass index is 23.13 kg/m.         Review of Systems  As chest pain, dyspnea on exertion, PND, orthopnea, hemoptysis    Objective:   Physical Exam BP 128/79 (BP Location: Left Arm, Patient Position: Sitting, Cuff Size: Normal)   Pulse 73   Temp 97.7 F (36.5 C) (Oral)   Resp 18   Ht 5\' 3"  (1.6 m)   Wt 130 lb 9.6 oz (59.2 kg)   SpO2 96%   BMI 23.13 kg/m   Elderly female no apparent distress alert and oriented 3 Lungs no rhonchi or wheezing Both legs with extensive bulging varicosities beginning in the anterior mid thighs extending  lateral to the knees and into the pretibial region and posterior calf bilaterally. This extensive network of reticular veins in the peri-malleolar area bilaterally area no active ulcer. 2+ dorsalis pedis pulse palpable bilaterally.  Previous bilateral venous reflux study revealed absent great saphenous veins bilaterally and no significant reflux in the small saphenous veins     Assessment:     #1 painful residual-recurrent varicosities bilaterally status post bilateral laser ablation procedures at another institution in the past. These are causing pain which is resistant to conservative measures including long-leg elastic compression stockings 20-30 millimeter gradient, elevation, and ibuprofen. This is affecting patient's daily living.    Plan:     Patient needs #1 multiple stab phlebectomy-greater than 20 of painful varicosities right leg followed by #2 multiple stab phlebectomy-greater than 20 painful varicosities left leg followed by #3 2 courses of sclerotherapy  We will proceed with precertification to perform this in the near future and hopefully relieve her symptoms

## 2016-04-15 ENCOUNTER — Encounter: Payer: Self-pay | Admitting: Vascular Surgery

## 2016-04-27 ENCOUNTER — Ambulatory Visit (INDEPENDENT_AMBULATORY_CARE_PROVIDER_SITE_OTHER): Payer: Medicare PPO | Admitting: Vascular Surgery

## 2016-04-27 ENCOUNTER — Encounter: Payer: Self-pay | Admitting: Vascular Surgery

## 2016-04-27 VITALS — BP 162/89 | HR 82 | Temp 96.9°F | Resp 16 | Ht 64.0 in | Wt 130.0 lb

## 2016-04-27 DIAGNOSIS — I83893 Varicose veins of bilateral lower extremities with other complications: Secondary | ICD-10-CM | POA: Diagnosis not present

## 2016-04-27 HISTORY — PX: OTHER SURGICAL HISTORY: SHX169

## 2016-04-27 NOTE — Progress Notes (Signed)
Subjective:     Patient ID: Donna Callahan, female   DOB: 11-28-1930, 83 y.o.   MRN: [id number]  HPI This 83 year old female had multiple stab phlebectomy of secondary painful varicosities in the right leg-approximately 40-performed under local tumescent anesthesia. She tolerated procedure well.  Review of Systems     Objective:   Physical Exam BP (!) 162/89 (BP Location: Left Arm, Patient Position: Sitting, Cuff Size: Normal)   Pulse 82   Temp (!) 96.9 F (36.1 C) (Oral)   Resp 16   Ht  (1.626 m)   Wt 130 lb (59 kg)   SpO2 97%   BMI 22.31 kg/m        Assessment:     Well-tolerated multiple stab phlebectomy painful varicosities right leg-40-performed under local tumescent anesthesia    Plan:     Return in 2 weeks for similar procedure and contralateral left leg

## 2016-04-27 NOTE — Progress Notes (Signed)
    Stab Phlebectomy Procedure  Donna Callahan DOB:Sep 22, 1930  04/27/2016  Consent signed: Yes  Surgeon:J.D. Hart Rochester, MD  Procedure: stab phlebectomy: right leg  BP (!) 162/89 (BP Location: Left Arm, Patient Position: Sitting, Cuff Size: Normal)   Pulse 82   Temp (!) 96.9 F (36.1 C) (Oral)   Resp 16   Ht  (1.626 m)   Wt 130 lb (59 kg)   SpO2 97%   BMI 22.31 kg/m   Start time: 11:00AM   End time: 11:50AM   Tumescent Anesthesia: 300 cc 0.9% NaCl with 50 cc Lidocaine HCL with 1% Epi and 15 cc 8.4% NaHCO3  Local Anesthesia: 6 cc Lidocaine HCL and NaHCO3 (ratio 2:1)    Stab Phlebectomy: >20  Sites: Thigh, Calf and Ankle  Patient tolerated procedure well: Yes    Description of Procedure:  After marking the course of the secondary varicosities, the patient was placed on the operating table in the supine position, and the right leg was prepped and draped in sterile fashion.    The patient was then put into Trendelenburg position.  Local anesthetic was administered at the previously marked varicosities, and tumescent anesthesia was administered around the vessels.  Greater than 20 stab wounds were made using the tip of an 11 blade. And using the vein hook, the phlebectomies were performed using a hemostat to avulse the varicosities.  Adequate hemostasis was achieved, and steri strips were applied to the stab wound.      ABD pads and thigh high compression stockings were applied as well ace wraps where needed. Blood loss was less than 15 cc.  The patient ambulated out of the operating room having tolerated the procedure well.

## 2016-04-28 ENCOUNTER — Encounter: Payer: Self-pay | Admitting: Vascular Surgery

## 2016-04-30 ENCOUNTER — Encounter: Payer: Self-pay | Admitting: Vascular Surgery

## 2016-05-11 ENCOUNTER — Encounter: Payer: Self-pay | Admitting: Vascular Surgery

## 2016-05-11 ENCOUNTER — Ambulatory Visit (INDEPENDENT_AMBULATORY_CARE_PROVIDER_SITE_OTHER): Payer: Medicare PPO | Admitting: Vascular Surgery

## 2016-05-11 VITALS — BP 155/82 | HR 68 | Temp 97.7°F | Resp 16 | Ht 64.0 in | Wt 130.0 lb

## 2016-05-11 DIAGNOSIS — I83892 Varicose veins of left lower extremities with other complications: Secondary | ICD-10-CM | POA: Diagnosis not present

## 2016-05-11 HISTORY — PX: OTHER SURGICAL HISTORY: SHX169

## 2016-05-11 NOTE — Progress Notes (Signed)
Subjective:     Patient ID: Donna Callahan, female   DOB: November 25, 1930, 83 y.o.   MRN: [id number]  HPI This 83 year old female had multiple stab phlebectomy-greater than 20 of painful varicosities left thigh and calf performed under local tumescent anesthesia. She tolerated the procedure well.  Review of Systems     Objective:   Physical Exam BP (!) 155/82 (BP Location: Left Arm, Patient Position: Sitting, Cuff Size: Normal)   Pulse 68   Temp 97.7 F (36.5 C) (Oral)   Resp 16   Ht  (1.626 m)   Wt 130 lb (59 kg)   SpO2 97%   BMI 22.31 kg/m        Assessment:     Well-tolerated multiple stab phlebectomy-approximately 30-of painful varicosities left leg performed under local tumescent anesthesia    Plan:     Return May 23 for sclerotherapy per Marisue Ivan and this will complete patient's treatment regimen

## 2016-05-11 NOTE — Progress Notes (Signed)
    Stab Phlebectomy Procedure  Donna Callahan DOB:07/14/1930  05/11/2016  Consent signed: Yes  Surgeon:J.D. Hart Rochester, MD  Procedure: stab phlebectomy: left leg  BP (!) 155/82 (BP Location: Left Arm, Patient Position: Sitting, Cuff Size: Normal)   Pulse 68   Temp 97.7 F (36.5 C) (Oral)   Resp 16   Ht  (1.626 m)   Wt 130 lb (59 kg)   SpO2 97%   BMI 22.31 kg/m   Start time: 11:00AM   End time: 11:45AM   Tumescent Anesthesia: 250 cc 0.9% NaCl with 50 cc Bupivacaine 0.5%  and 15 cc 8.4% NaHCO3  Local Anesthesia: 18 cc   Bupivacaine HCL    Stab Phlebectomy: > 20  Sites: Thigh and Calf  Patient tolerated procedure well: Yes    Description of Procedure:  After marking the course of the secondary varicosities, the patient was placed on the operating table in the supine position, and the left leg was prepped and draped in sterile fashion.    The patient was then put into Trendelenburg position.  Local anesthetic was administered at the previously marked varicosities, and tumescent anesthesia was administered around the vessels.  Greater than 20 stab wounds were made using the tip of an 11 blade. And using the vein hook, the phlebectomies were performed using a hemostat to avulse the varicosities.  Adequate hemostasis was achieved, and steri strips were applied to the stab wound.    .  ABD pads and thigh high compression stockings were applied as well ace wraps where needed. Blood loss was less than 15 cc.  The patient ambulated out of the operating room having tolerated the procedure well.

## 2016-05-13 ENCOUNTER — Encounter: Payer: Self-pay | Admitting: Vascular Surgery

## 2016-05-29 DIAGNOSIS — E785 Hyperlipidemia, unspecified: Secondary | ICD-10-CM | POA: Diagnosis not present

## 2016-05-29 DIAGNOSIS — I10 Essential (primary) hypertension: Secondary | ICD-10-CM | POA: Diagnosis not present

## 2016-05-29 DIAGNOSIS — H547 Unspecified visual loss: Secondary | ICD-10-CM | POA: Diagnosis not present

## 2016-05-29 DIAGNOSIS — M1612 Unilateral primary osteoarthritis, left hip: Secondary | ICD-10-CM | POA: Diagnosis not present

## 2016-05-29 DIAGNOSIS — K580 Irritable bowel syndrome with diarrhea: Secondary | ICD-10-CM | POA: Diagnosis not present

## 2016-05-29 DIAGNOSIS — H9193 Unspecified hearing loss, bilateral: Secondary | ICD-10-CM | POA: Diagnosis not present

## 2016-05-29 DIAGNOSIS — Z96651 Presence of right artificial knee joint: Secondary | ICD-10-CM | POA: Diagnosis not present

## 2016-05-29 DIAGNOSIS — G2581 Restless legs syndrome: Secondary | ICD-10-CM | POA: Diagnosis not present

## 2016-05-29 DIAGNOSIS — M419 Scoliosis, unspecified: Secondary | ICD-10-CM | POA: Diagnosis not present

## 2016-06-02 ENCOUNTER — Encounter: Payer: Self-pay | Admitting: *Deleted

## 2016-06-10 ENCOUNTER — Ambulatory Visit (INDEPENDENT_AMBULATORY_CARE_PROVIDER_SITE_OTHER): Payer: Medicare PPO | Admitting: *Deleted

## 2016-06-10 DIAGNOSIS — I83893 Varicose veins of bilateral lower extremities with other complications: Secondary | ICD-10-CM

## 2016-06-10 NOTE — Progress Notes (Signed)
X=.3% Sotradecol administered with a 27g butterfly.  Patient received a total of 24cc.  Treated as much as I could with 4 syringes. Easy access. Tol well. She has one more ins covered tx. Will see her again in July.    Compression stockings applied: Yes.

## 2016-06-11 ENCOUNTER — Encounter: Payer: Self-pay | Admitting: *Deleted

## 2016-06-25 DIAGNOSIS — N814 Uterovaginal prolapse, unspecified: Secondary | ICD-10-CM | POA: Diagnosis not present

## 2016-06-25 DIAGNOSIS — N8111 Cystocele, midline: Secondary | ICD-10-CM | POA: Diagnosis not present

## 2016-07-08 IMAGING — US IR EMBO VENOUS NOT [PERSON_NAME]  INC GUIDE ROADMAPPING
1 series · 9 of 9 positions shown · non-contrast
Comparison: none

CLINICAL DATA: Known bilateral GSV venous insufficiency, lower
extremity varicose veins. Progressive right lower extremity venous
insufficiency with enlarging right thigh, knee and calf
varicosities, edema, leg pain and fatigue.

[Series 1: ir embo venous not (person_name) inc guide roadmap · 0.08mm/px · 7 acquisitions, 9 frames shown]
[im 1/7]
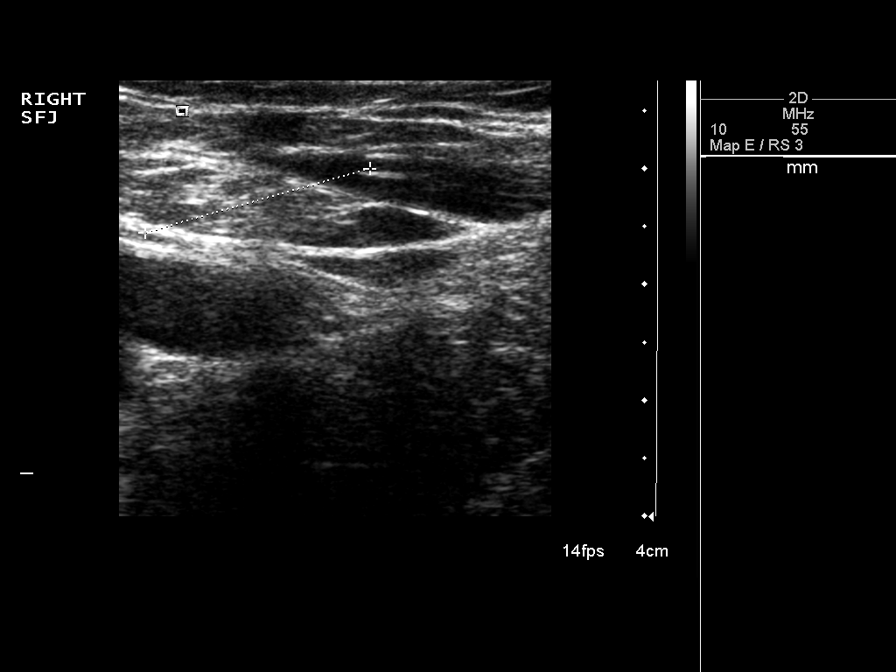
[im 2/7]
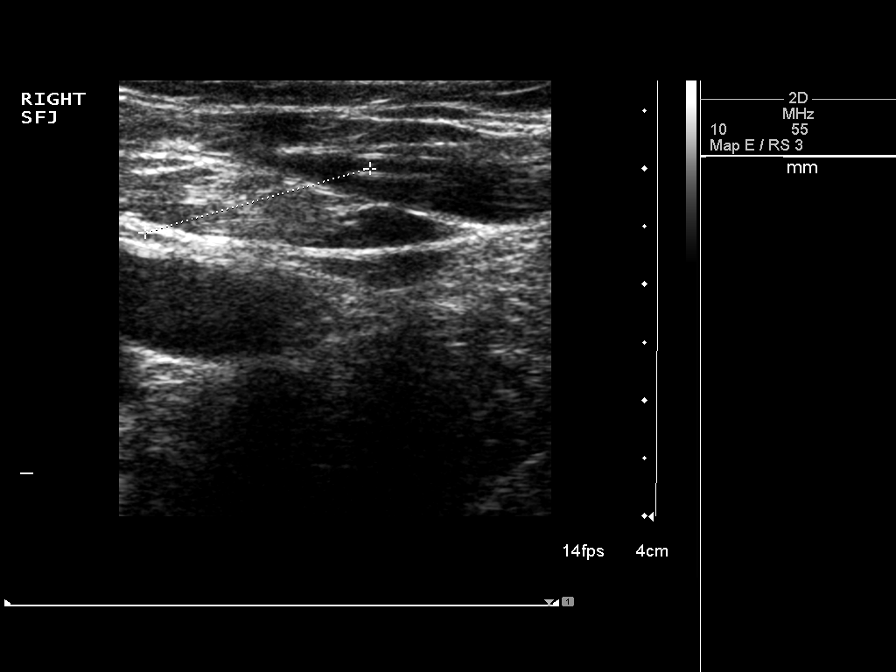
[im 3/7]
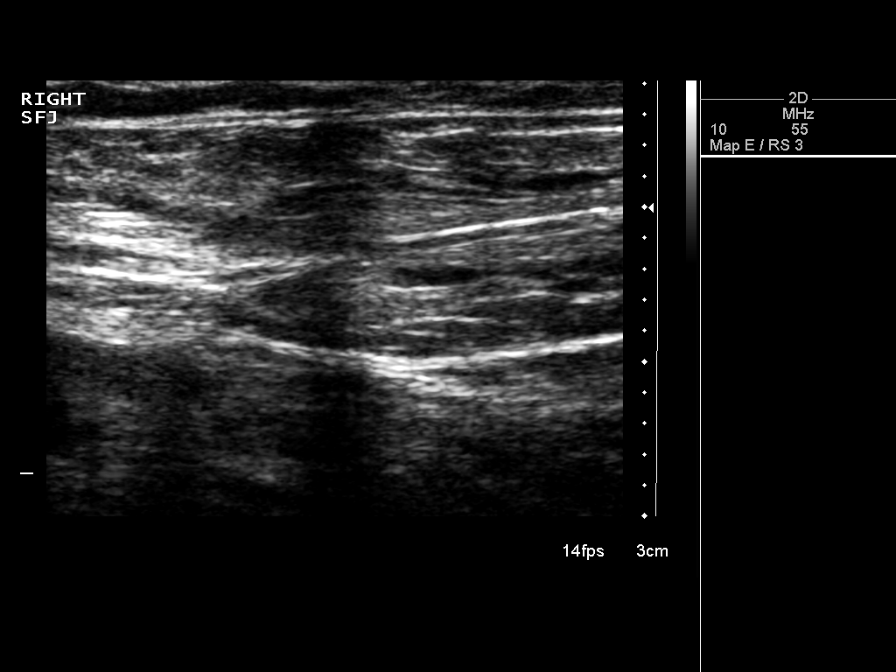
[im 4/7]
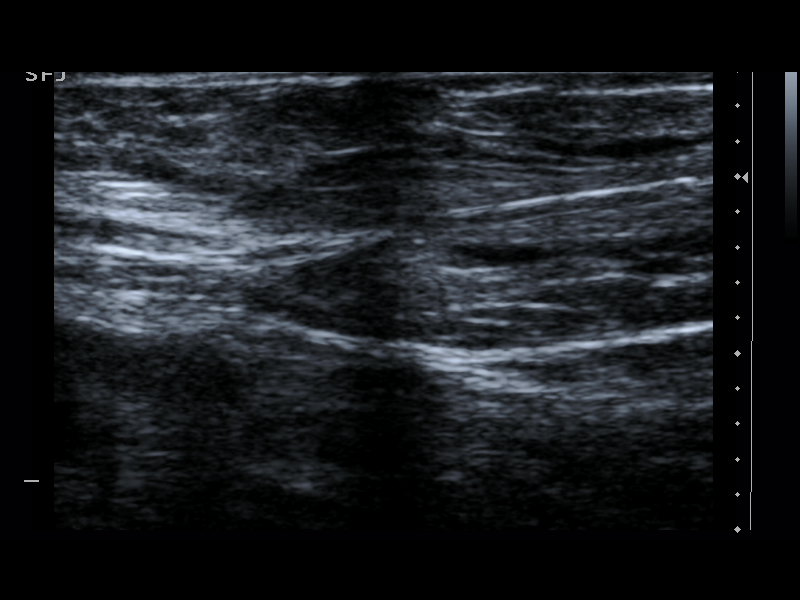
[im 4/7]
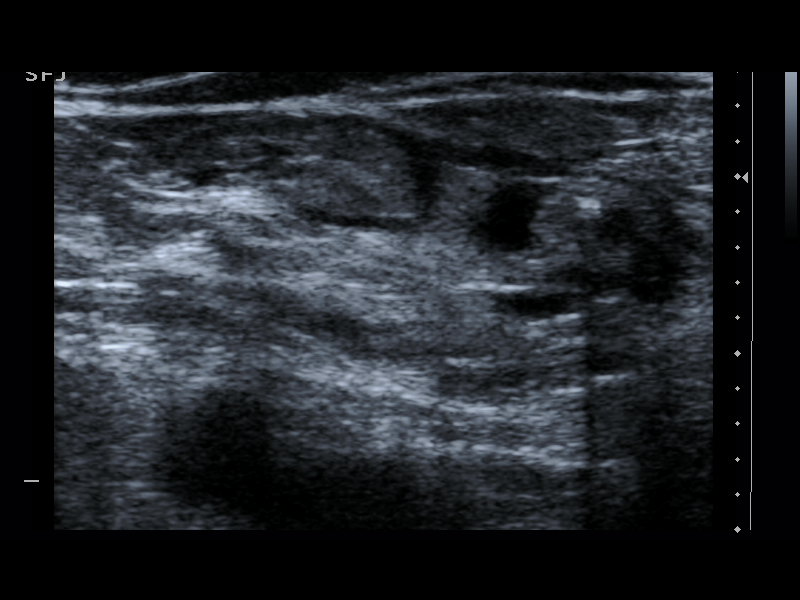
[im 5/7]
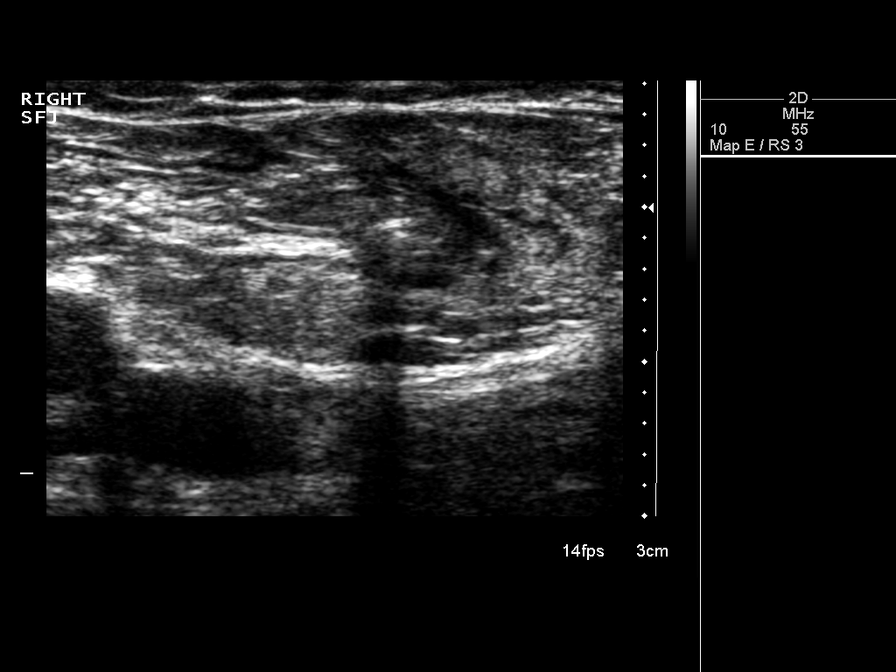
[im 6/7]
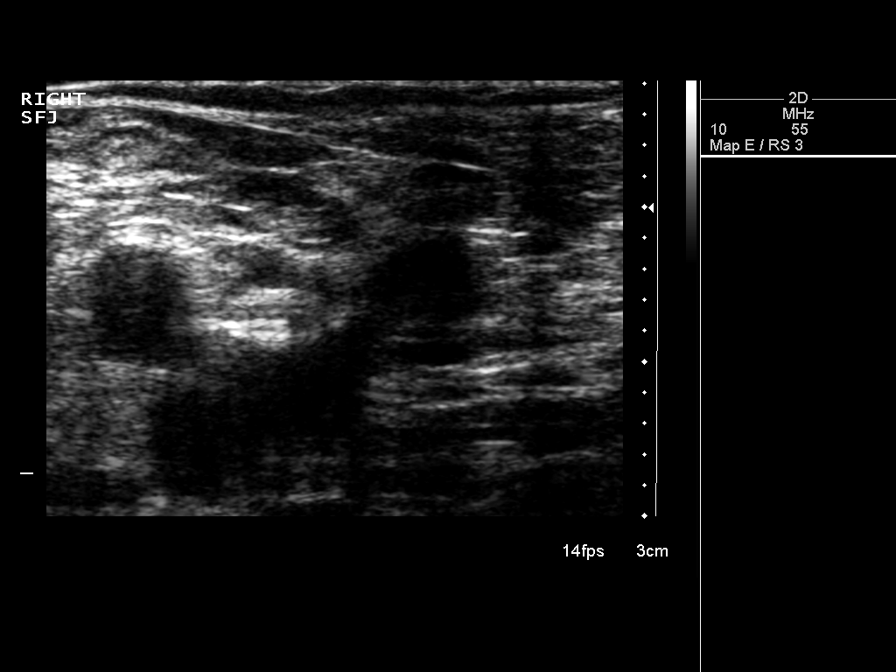
[im 7/7]
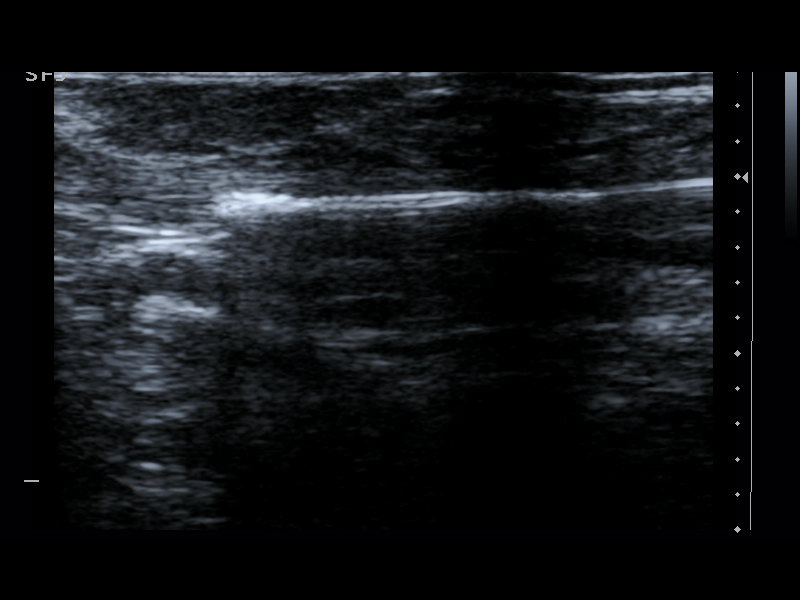
[im 7/7]
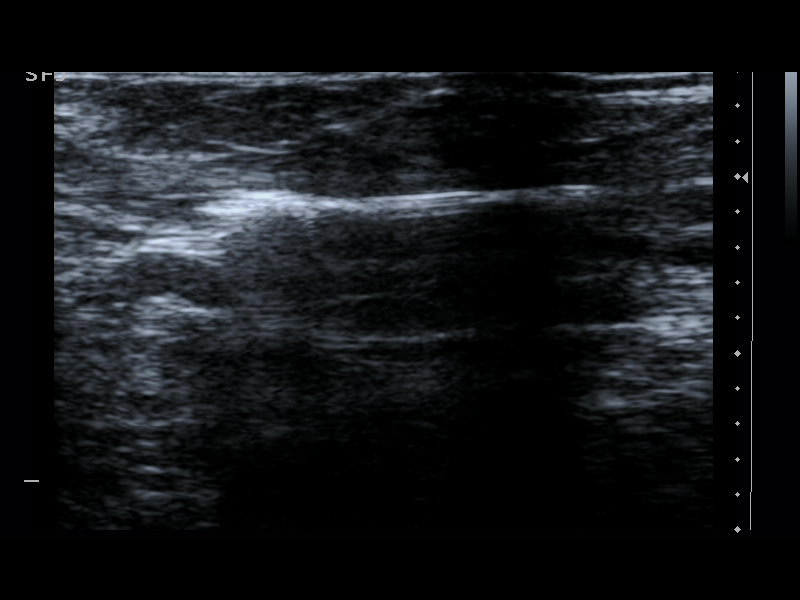

[9 of 9 positions shown; findings below may reference images not displayed]

EXAM:
ULTRASOUND GUIDED RIGHT GSV TRANSCATHETER LASER OCCLUSION

Radiologist:  Ranu, Shattik

Guidance:  Ultrasound

FLUOROSCOPY TIME:  None.

MEDICATIONS AND MEDICAL HISTORY:
300 cc 1% diluted Lidocaine for tumescent anesthesia

ANESTHESIA/SEDATION:
None.

CONTRAST:  None.

COMPLICATIONS:
None immediate

PROCEDURE:
Informed consent was obtained from the patient following explanation
of the procedure, risks, benefits and alternatives. The patient
understands, agrees and consents for the procedure. All questions
were addressed. A time out was performed.

Maximal barrier sterile technique utilized including caps, mask,
sterile gowns, sterile gloves, large sterile drape, hand hygiene,
and Betadine.

The preliminary venous mapping performed of the right GSV from the
saphenous femoral junction to the ankle. Overlying skin was marked.
Right leg was sterilely prepped and draped. Under sterile conditions
and local anesthesia, ultrasound micropuncture access performed of
the inferior right GSV above the ankle. Guidewire advanced easily
followed by the 4 French dilator. 035 guidewire inserted and
advanced across the left SFJ without difficulty. 4 French delivery
sheath advanced and position 20 mm below the left SFJ. Position
confirmed with ultrasound. Images obtained for documentation. Laser
fiber connected to the energy source and advanced through the sheath
but not exposed to the vein wall. Local and tumescent anesthesia
applied with the tumescent pump delivery [DATE] cc
diluted 1% lidocaine. Survey of the right GSV segment to be treated
confirmed adequate insulation, collapse and tumescence around the
vein. Vein depth is greater than 10 mm.

Laser fiber was enable at power setting of 8 W. Laser fiber position
recovered to be 20 mm inferior to the right SFJ. The treated segment
was 60 cm in length. 365 seconds of laser time was utilized to treat
the right GSV with a total laser energy of 0000 Joules.

Hemostasis obtained with compression. Patient tolerated the
procedure well. No immediate complication. She was ambulated in our
[REDACTED] in a 20/30 mm Hg compression stockings and stable for
discharge after 30 min.
IMPRESSION: Successful ultrasound right GSV transcatheter laser occlusion.

PLAN:
Follow-up will be performed at 1 week, 1 month and 6 months. She
will likely need additional right lower extremity varicose vein
sclerotherapy versus vein removal.

## 2016-07-20 ENCOUNTER — Encounter: Payer: Self-pay | Admitting: *Deleted

## 2016-07-29 ENCOUNTER — Ambulatory Visit (INDEPENDENT_AMBULATORY_CARE_PROVIDER_SITE_OTHER): Payer: Medicare PPO | Admitting: *Deleted

## 2016-07-29 DIAGNOSIS — I83893 Varicose veins of bilateral lower extremities with other complications: Secondary | ICD-10-CM | POA: Diagnosis not present

## 2016-07-29 NOTE — Progress Notes (Signed)
X=.3% Sotradecol administered with a 27g butterfly.  Patient received a total of 6cc.  L treated a few small red vessels that were still open or were new. Healing well from her first tx. (Still has some swelling of her right ankle but she says it is not worsening.) This completes her sessions. Follow prn.     Compression stockings applied: No. She forgot her stockings so wrapped her in ace wraps. She will change into her stockings when she gets home.

## 2016-08-03 ENCOUNTER — Encounter: Payer: Self-pay | Admitting: *Deleted

## 2016-08-07 DIAGNOSIS — M859 Disorder of bone density and structure, unspecified: Secondary | ICD-10-CM | POA: Diagnosis not present

## 2016-08-07 DIAGNOSIS — E784 Other hyperlipidemia: Secondary | ICD-10-CM | POA: Diagnosis not present

## 2016-08-07 DIAGNOSIS — I10 Essential (primary) hypertension: Secondary | ICD-10-CM | POA: Diagnosis not present

## 2016-08-14 DIAGNOSIS — I8390 Asymptomatic varicose veins of unspecified lower extremity: Secondary | ICD-10-CM | POA: Diagnosis not present

## 2016-08-14 DIAGNOSIS — E784 Other hyperlipidemia: Secondary | ICD-10-CM | POA: Diagnosis not present

## 2016-08-14 DIAGNOSIS — M25512 Pain in left shoulder: Secondary | ICD-10-CM | POA: Diagnosis not present

## 2016-08-14 DIAGNOSIS — Z0000 Encounter for general adult medical examination without abnormal findings: Secondary | ICD-10-CM | POA: Diagnosis not present

## 2016-08-14 DIAGNOSIS — I700 Atherosclerosis of aorta: Secondary | ICD-10-CM | POA: Diagnosis not present

## 2016-08-14 DIAGNOSIS — N182 Chronic kidney disease, stage 2 (mild): Secondary | ICD-10-CM | POA: Diagnosis not present

## 2016-08-14 DIAGNOSIS — N8189 Other female genital prolapse: Secondary | ICD-10-CM | POA: Diagnosis not present

## 2016-08-14 DIAGNOSIS — I129 Hypertensive chronic kidney disease with stage 1 through stage 4 chronic kidney disease, or unspecified chronic kidney disease: Secondary | ICD-10-CM | POA: Diagnosis not present

## 2016-08-14 DIAGNOSIS — R1319 Other dysphagia: Secondary | ICD-10-CM | POA: Diagnosis not present

## 2016-08-17 ENCOUNTER — Telehealth: Payer: Self-pay

## 2016-08-17 NOTE — Telephone Encounter (Signed)
Sent notes to scheduling 

## 2016-08-21 DIAGNOSIS — Z1212 Encounter for screening for malignant neoplasm of rectum: Secondary | ICD-10-CM | POA: Diagnosis not present

## 2016-09-02 DIAGNOSIS — H40013 Open angle with borderline findings, low risk, bilateral: Secondary | ICD-10-CM | POA: Diagnosis not present

## 2016-09-02 DIAGNOSIS — Z821 Family history of blindness and visual loss: Secondary | ICD-10-CM | POA: Diagnosis not present

## 2016-09-02 DIAGNOSIS — H16223 Keratoconjunctivitis sicca, not specified as Sjogren's, bilateral: Secondary | ICD-10-CM | POA: Diagnosis not present

## 2016-09-09 ENCOUNTER — Ambulatory Visit: Payer: Medicare PPO | Admitting: Nurse Practitioner

## 2016-09-14 DIAGNOSIS — I8310 Varicose veins of unspecified lower extremity with inflammation: Secondary | ICD-10-CM | POA: Diagnosis not present

## 2016-09-14 DIAGNOSIS — Z6824 Body mass index (BMI) 24.0-24.9, adult: Secondary | ICD-10-CM | POA: Diagnosis not present

## 2016-09-14 DIAGNOSIS — R0609 Other forms of dyspnea: Secondary | ICD-10-CM | POA: Diagnosis not present

## 2016-09-29 ENCOUNTER — Encounter: Payer: Self-pay | Admitting: Physician Assistant

## 2016-09-29 ENCOUNTER — Encounter (INDEPENDENT_AMBULATORY_CARE_PROVIDER_SITE_OTHER): Payer: Self-pay

## 2016-09-29 ENCOUNTER — Ambulatory Visit (INDEPENDENT_AMBULATORY_CARE_PROVIDER_SITE_OTHER): Payer: Medicare PPO | Admitting: Physician Assistant

## 2016-09-29 VITALS — BP 165/84 | HR 73 | Ht 64.0 in | Wt 134.0 lb

## 2016-09-29 DIAGNOSIS — R0609 Other forms of dyspnea: Secondary | ICD-10-CM

## 2016-09-29 DIAGNOSIS — I10 Essential (primary) hypertension: Secondary | ICD-10-CM

## 2016-09-29 DIAGNOSIS — E7849 Other hyperlipidemia: Secondary | ICD-10-CM

## 2016-09-29 DIAGNOSIS — R0602 Shortness of breath: Secondary | ICD-10-CM | POA: Diagnosis not present

## 2016-09-29 DIAGNOSIS — E784 Other hyperlipidemia: Secondary | ICD-10-CM | POA: Diagnosis not present

## 2016-09-29 NOTE — Progress Notes (Signed)
Cardiology Office Note    Date:  09/29/2016   ID:  Donna FermoCarolyn C Callahan, DOB [birth year], MRN [id number]  PCP:  Donna Callahan, Mark, MD  Cardiologist:  New to Dr. Elease HashimotoNahser  Chief Complaint: DOE   History of Present Illness:   Donna Callahan is a 83 y.o. female with a history of hypertension, hyperlipidemia., CKD stage II and bilateral varicose vein s/p phlebectomy presented for evaluation of dyspnea on exertion. The patient was referred by Dr. Sherrye Callahan.  Patient has a normal stress test in December 2008. Patient was seen by PCP 08/17/2016 for yearly follow-up. Complaint of dyspnea on exertion and deferred to cardiology for further evaluation.  Labor by PCP 08/07/2016: BUN/Creatinine: 19/0.7; NA 139; K 4.7; AST 21; ALT 23; Albumin 3.9 Hgb 13 Triglyceride 32; HDL 71; LDL 88 TSH 1.74.   The patient having the dyspnea on exertion for the past 6 months. She gets  shortness of breath after walking few hundred of steps. She lives independently and take care of household chores. Progressively worsened. She denies any chest pain/tightness, palpitation, dizziness, orthopnea, PND, syncope, lower extremity edema or melena. No prior history of MI or stroke. Denies tobacco abuse, alcohol or illicit drug use.  Father had a MI at age 81 and died of it. All  family members has history of hypertension. Two son died of cancer. BP usually runs high in AM before taking her medications. Stable lately,.   Past Medical History:  Diagnosis Date  . CKD (chronic kidney disease) stage 2, GFR 60-89 ml/min   . Hard of hearing   . High cholesterol   . Hypertension   . Iron deficiency anemia   . Varicose veins     Past Surgical History:  Procedure Laterality Date  . stab phlebectomy Right 04/27/2016   stab phlebectomy right leg by Josephina GipJames Lawson MD   . stab phlebectomy  Left 05/11/2016   stab phlebectomy > 20 incisions left leg by Josephina GipJames Lawson MD   . STAPEDECTOMY     bil ears  . TONSILLECTOMY    . TOTAL KNEE  ARTHROPLASTY Right 05/02/2014   Procedure: RIGHT TOTAL KNEE ARTHROPLASTY;  Surgeon: Ollen GrossFrank Aluisio, MD;  Location: [facility];  Service: Orthopedics;  Laterality: Right;  Marland Kitchen. VARICOSE VEIN SURGERY      Current Medications:  Prior to Admission medications   Medication Sig Start Date End Date Taking? Authorizing Provider  acetaminophen (TYLENOL) 500 MG tablet Take 500-1,000 mg by mouth every morning.   Yes [provider]  BIOTIN PO Take by mouth.   Yes [provider]  Cholecalciferol (VITAMIN D PO) Take 1 tablet by mouth daily.   Yes [provider]  Coenzyme Q10 (COQ10) 100 MG CAPS Take by mouth.   Yes [provider]  diltiazem (TIAZAC) 240 MG 24 hr capsule Take 240 mg by mouth every morning.    Yes [provider]  irbesartan (AVAPRO) 150 MG tablet Take 150 mg by mouth daily.   Yes [provider]  loperamide (IMODIUM) 2 MG capsule Take by mouth as needed for diarrhea or loose stools.   Yes [provider]  Multiple Vitamin (MULTIVITAMIN) tablet Take 1 tablet by mouth daily.   Yes [provider]  mupirocin ointment (BACTROBAN) 2 % Place 1 application into the nose 2 (two) times daily.   Yes [provider]  Polyethyl Glycol-Propyl Glycol (SYSTANE OP) Apply 1 drop to eye every morning.   Yes [provider]  potassium chloride SA (KLOR-CON  M20) 20 MEQ tablet TAKE 3 TABLETS BY MOUTH EVERY DAY 12/21/14  Yes [provider]  psyllium (METAMUCIL) 58.6 % powder Take 1 packet by mouth at bedtime as needed (for constipation).    Yes [provider]  Simethicone (GAS-X PO) Take 1 tablet by mouth daily as needed (gas).   Yes [provider]  simvastatin (ZOCOR) 20 MG tablet Take 10 mg by mouth at bedtime.    Yes [provider]  sodium chloride (OCEAN) 0.65 % SOLN nasal spray Place 1-2 sprays into both nostrils every morning.   Yes [provider]    triamterene-hydrochlorothiazide (MAXZIDE-25) 37.5-25 MG tablet TAKE 1 TABLET BY MOUTH ONCE DAILY 04/22/15  Yes [provider]  HYDROcodone-acetaminophen (NORCO/VICODIN) 5-325 MG tablet Take 1 tablet by mouth every 4 (four) hours as needed. Patient not taking: Reported on 09/29/2016 02/17/15   Donna Haggis, MD  Menthol, Topical Analgesic, (BIOFREEZE ROLL-ON EX) Apply topically.    [provider]  Omega-3 Fatty Acids (FISH OIL PO) Take 3 tablets by mouth.    [provider]  pantoprazole (PROTONIX) 20 MG tablet Take 20 mg by mouth daily.    [provider]    Allergies:   Benadryl [diphenhydramine hcl (sleep)]; Diphenhydramine hcl; Penicillins; Sulfacetamide sodium; Penicillin g; and Sulfa antibiotics   Social History   Social History  . Marital status: Widowed    Spouse name: [name]  . Number of children: [name]  . Years of education: [name]   Social History Main Topics  . Smoking status: Never Smoker  . Smokeless tobacco: Never Used  . Alcohol use No  . Drug use: No  . Sexual activity: No     Comment: HUSBAND DIED May 31, 2004   Other Topics Concern  . None   Social History Narrative  . None     Family History:  The patient's family history includes AAA (abdominal aortic aneurysm) in her other; Aortic dissection in her sister; Bronchiolitis in her mother; Cancer - Lung in her son; Cancer - Prostate in her son; Fibromyalgia in her daughter; Healthy in her grandchild; Heart attack in her father; Hypertension in her mother; Other in her other; Pneumonia in her mother.   ROS:   Please see the history of present illness.    ROS All other systems reviewed and are negative.   PHYSICAL EXAM:   VS:  BP (!) 165/84   Pulse 73   Ht  (1.626 m)   Wt 134 lb (60.8 kg)   SpO2 96%   BMI 23.00 kg/m    GEN: Well nourished, well developed, in no acute distress  HEENT: normal  Neck: no JVD, carotid bruits, or masses Cardiac: RRR; Systolic murmurs, rubs, or  gallops,no edema  Respiratory:  clear to auscultation bilaterally, normal work of breathing GI: soft, nontender, nondistended, + BS MS: no deformity or atrophy  Skin: warm and dry, no rash Neuro:  Alert and Oriented x 3, Strength and sensation are intact Psych: euthymic mood, full affect  Wt Readings from Last 3 Encounters:  09/29/16 134 lb (60.8 kg)  05/11/16 130 lb (59 kg)  04/27/16 130 lb (59 kg)      Studies/Labs Reviewed:   EKG:  EKG is ordered today.  The ekg ordered today demonstrates sinus rhythm at rate of 73 bpm  Recent Labs: No results found for requested labs within last 8760 hours.   Lipid Panel No results found for: CHOL, TRIG, HDL, CHOLHDL, VLDL, LDLCALC, LDLDIRECT  Additional  studies/ records that were reviewed today include:   As above    ASSESSMENT & PLAN:    1. Dyspnea on exertion - Seems likely due to deconditioning from back issue. Continue exercise regimen. This could be due to diastolic dysfunction due to high blood pressure/age. No signs and symptoms of heart failure or angina. Echocardiogram for further evaluation.  2. HTN - Initial blood pressure of 165/84. Repeat check 138/80. She takes Cardizem extended release 240 mg daily in morning, Triamterene/HCTZ 1/2 tablet of 37.5/25mg  in AM and Avapro  in evening. He does have a history of high blood pressure mostly in morning before taking medications. - No change in current therapy. We will continue to observe. Might consider changing either triamterene and Avapro (can not be on both).   3. HLD - continue statin. Followed by PCP.     Medication Adjustments/Labs and Tests Ordered: Current medicines are reviewed at length with the patient today.  Concerns regarding medicines are outlined above.  Medication changes, Labs and Tests ordered today are listed in the Patient Instructions below. There are no Patient Instructions on file for this visit.   Lorelei Pont, [place]  09/29/2016  10:34 AM    [facility] [address] [place], [place], [place]  [address] Phone: [phone]; Fax: [phone]   Attending Note:   The patient was seen and examined.  Agree with assessment and plan as noted above.  Changes made to the above note as needed.  Patient seen and independently examined with Chelsea Aus, PA .   We discussed all aspects of the encounter. I agree with the assessment and plan as stated above.  1.  DOE:   Agree with getting echo to assess LV systolic and diastolic function .  Will encourage her to exercise more. Follow up in several months    I have spent a total of 40 minutes with patient reviewing hospital  notes , telemetry, EKGs, labs and examining patient as well as establishing an assessment and plan that was discussed with the patient. > 50% of time was spent in direct patient care.    Vesta Mixer, Montez Hageman., MD, [facility] 09/29/2016, 6:16 PM [address]  [address] Office [phone] Pager [phone]

## 2016-09-29 NOTE — Patient Instructions (Signed)
Medication Instructions:   Your physician recommends that you continue on your current medications as directed. Please refer to the Current Medication list given to you today.   If you need a refill on your cardiac medications before your next appointment, please call your pharmacy.  Labwork: NONE ORDERED  TODAY    Testing/Procedures: Your physician has requested that you have an echocardiogram. Echocardiography is a painless test that uses sound waves to create images of your heart. It provides your doctor with information about the size and shape of your heart and how well your heart's chambers and valves are working. This procedure takes approximately one hour. There are no restrictions for this procedure.   Follow-Up:  2 TO 3  MONTHS WITH DR Elease HashimotoNAHSER      Any Other Special Instructions Will Be Listed Below (If Applicable).                                                                                                                                                  \

## 2016-10-13 ENCOUNTER — Ambulatory Visit (HOSPITAL_COMMUNITY): Payer: Medicare PPO | Attending: Cardiology

## 2016-10-13 ENCOUNTER — Other Ambulatory Visit: Payer: Self-pay

## 2016-10-13 DIAGNOSIS — I080 Rheumatic disorders of both mitral and aortic valves: Secondary | ICD-10-CM | POA: Insufficient documentation

## 2016-10-13 DIAGNOSIS — I5030 Unspecified diastolic (congestive) heart failure: Secondary | ICD-10-CM | POA: Diagnosis not present

## 2016-10-13 DIAGNOSIS — I420 Dilated cardiomyopathy: Secondary | ICD-10-CM | POA: Insufficient documentation

## 2016-10-13 DIAGNOSIS — R0609 Other forms of dyspnea: Secondary | ICD-10-CM

## 2016-10-13 DIAGNOSIS — R0602 Shortness of breath: Secondary | ICD-10-CM

## 2016-10-16 NOTE — Progress Notes (Unsigned)
Spoke with patient about her results and reminded her of her appointment with Dr. Elease Hashimoto in November. She expressed verbal understanding and asked that results be sent to her mychart.

## 2016-11-02 DIAGNOSIS — M859 Disorder of bone density and structure, unspecified: Secondary | ICD-10-CM | POA: Diagnosis not present

## 2016-11-03 DIAGNOSIS — I10 Essential (primary) hypertension: Secondary | ICD-10-CM | POA: Diagnosis not present

## 2016-11-20 DIAGNOSIS — Z6823 Body mass index (BMI) 23.0-23.9, adult: Secondary | ICD-10-CM | POA: Diagnosis not present

## 2016-11-20 DIAGNOSIS — R197 Diarrhea, unspecified: Secondary | ICD-10-CM | POA: Diagnosis not present

## 2016-11-20 DIAGNOSIS — K580 Irritable bowel syndrome with diarrhea: Secondary | ICD-10-CM | POA: Diagnosis not present

## 2016-11-20 DIAGNOSIS — N390 Urinary tract infection, site not specified: Secondary | ICD-10-CM | POA: Diagnosis not present

## 2016-11-25 DIAGNOSIS — K580 Irritable bowel syndrome with diarrhea: Secondary | ICD-10-CM | POA: Diagnosis not present

## 2016-12-03 ENCOUNTER — Ambulatory Visit: Payer: Medicare PPO | Admitting: Cardiovascular Disease

## 2016-12-07 ENCOUNTER — Ambulatory Visit (INDEPENDENT_AMBULATORY_CARE_PROVIDER_SITE_OTHER): Payer: Medicare PPO | Admitting: Cardiovascular Disease

## 2016-12-07 ENCOUNTER — Encounter: Payer: Self-pay | Admitting: Cardiovascular Disease

## 2016-12-07 DIAGNOSIS — K580 Irritable bowel syndrome with diarrhea: Secondary | ICD-10-CM | POA: Diagnosis not present

## 2016-12-07 DIAGNOSIS — I5032 Chronic diastolic (congestive) heart failure: Secondary | ICD-10-CM | POA: Insufficient documentation

## 2016-12-07 NOTE — Progress Notes (Signed)
Cardiology Office Note:    Date:  12/07/2016   ID:  Donna Callahan, DOB 10/11/1930, MRN [id number]  PCP:  Rodrigo RanPerini, Mark, MD  Cardiologist:  Kristeen MissPhilip Nahser, MD    Referring MD: Rodrigo RanPerini, Mark, MD   Chief Complaint  Patient presents with  . Shortness of Breath    History of Present Illness:    Donna FermoCarolyn C Lowery is a 83 y.o. female with a hx of hypertension, hyperlipidemia, chronic kidney disease stage II, who was seen by Chelsea AusVin Bhagat, PA and me in Sept. For DOE  BP was elevated at that time.    Having lots of GI issues.   Breathing is better.  Gives out when she walks any distance.    Thinks is due to back pain  Is watching her diet   Echo shows normal systolic function with grade 1 diastolic dysfunction.  She has moderate aortic insufficiency with a moderately dilated aorta.  Past Medical History:  Diagnosis Date  . CKD (chronic kidney disease) stage 2, GFR 60-89 ml/min   . Hard of hearing   . High cholesterol   . Hypertension   . Iron deficiency anemia   . Varicose veins     Past Surgical History:  Procedure Laterality Date  . RIGHT TOTAL KNEE ARTHROPLASTY Right 05/02/2014   [facility]uisio[facility] Frank[facility]  . stab phlebectomy Right 04/27/2016   stab phlebectomy right leg by Josephina GipJames Lawson MD   . stab phlebectomy  Left 05/11/2016   stab phlebectomy > 20 incisions left leg by Josephina GipJames Lawson MD   . STAPEDECTOMY     bil ears  . TONSILLECTOMY    . VARICOSE VEIN SURGERY      Current Medications: Current Meds  Medication Sig  . acetaminophen (TYLENOL) 500 MG tablet Take 500-1,000 mg by mouth as needed for moderate pain.   Marland Kitchen. BIOTIN PO Take by mouth.  . Calcium-Magnesium-Vitamin D (CALCIUM 1200+D3 PO) Take 1 tablet by mouth 2 (two) times daily.  . Coenzyme Q10 (COQ10) 100 MG CAPS Take by mouth.  . diltiazem (TIAZAC) 240 MG 24 hr capsule Take 240 mg by mouth every morning.   Marland Kitchen. doxazosin (CARDURA) 2 MG tablet Take 1 mg by mouth daily.  Marland Kitchen. estradiol (ESTRACE VAGINAL) 0.1  MG/GM vaginal cream Place 1 Applicatorful vaginally 3 (three) times a week.  . irbesartan (AVAPRO) 150 MG tablet Take 150 mg by mouth daily.  Marland Kitchen. loperamide (IMODIUM) 2 MG capsule Take by mouth as needed for diarrhea or loose stools.  . Multiple Vitamins-Minerals (CENTRUM SILVER PO) Take 1 tablet by mouth daily.  Bertram Gala. Polyethyl Glycol-Propyl Glycol (SYSTANE OP) Apply 1 drop to eye every morning.  . potassium chloride SA (KLOR-CON M20) 20 MEQ tablet TAKE 3 TABLETS BY MOUTH EVERY DAY  . psyllium (METAMUCIL) 58.6 % powder Take 1 packet by mouth at bedtime as needed (for constipation).   . Simethicone (GAS-X PO) Take 1 tablet by mouth daily as needed (gas).  . simvastatin (ZOCOR) 20 MG tablet Take 10 mg by mouth at bedtime.   . sodium chloride (OCEAN) 0.65 % SOLN nasal spray Place 1-2 sprays into both nostrils every morning.  . triamterene-hydrochlorothiazide (MAXZIDE-25) 37.5-25 MG tablet TAKE 1/2  TABLET BY MOUTH ONCE DAILY     Allergies:   Benadryl [diphenhydramine hcl (sleep)]; Diphenhydramine hcl; Penicillins; Sulfacetamide sodium; Penicillin g; and Sulfa antibiotics   Social History   Socioeconomic History  . Marital status: Widowed    Spouse name: None  .  Number of children: None  . Years of education: None  . Highest education level: None  Social Needs  . Financial resource strain: None  . Food insecurity - worry: None  . Food insecurity - inability: None  . Transportation needs - medical: None  . Transportation needs - non-medical: None  Occupational History  . None  Tobacco Use  . Smoking status: Never Smoker  . Smokeless tobacco: Never Used  Substance and Sexual Activity  . Alcohol use: No  . Drug use: No  . Sexual activity: No    Comment: HUSBAND DIED 2006  Other Topics Concern  . None  Social History Narrative  . None     Family History: The patient's family history includes AAA (abdominal aortic aneurysm) in her other; Aortic dissection in her sister; Bronchiolitis  in her mother; Cancer - Lung in her son; Cancer - Prostate in her son; Fibromyalgia in her daughter; Healthy in her grandchild; Heart attack in her father; Hypertension in her mother; Other in her other; Pneumonia in her mother. ROS:   Please see the history of present illness.     All other systems reviewed and are negative.  EKGs/Labs/Other Studies Reviewed:    The following studies were reviewed today:   EKG:    Recent Labs: No results found for requested labs within last 8760 hours.  Recent Lipid Panel No results found for: CHOL, TRIG, HDL, CHOLHDL, VLDL, LDLCALC, LDLDIRECT  Physical Exam:    VS:  BP 130/62   Pulse 68   Ht 5\' 2"  (1.575 m)   Wt 131 lb 12.8 oz (59.8 kg)   SpO2 91%   BMI 24.11 kg/m     Wt Readings from Last 3 Encounters:  12/07/16 131 lb 12.8 oz (59.8 kg)  09/29/16 134 lb (60.8 kg)  05/11/16 130 lb (59 kg)     GEN:   Elderly female,  Well nourished, well developed in no acute distress HEENT: Normal NECK: No JVD; No carotid bruits LYMPHATICS: No lymphadenopathy CARDIAC:  RR, soft systolic murmur ,  I did not hear a diastolic murmur today , rubs, gallops RESPIRATORY:  Clear to auscultation without rales, wheezing or rhonchi  ABDOMEN: Soft, non-tender, non-distended MUSCULOSKELETAL:  No edema; No deformity  SKIN: Warm and dry NEUROLOGIC:  Alert and oriented x 3 PSYCHIATRIC:  Normal affect   ASSESSMENT:    1. Chronic diastolic CHF (congestive heart failure) (HCC)    PLAN:    In order of problems listed above:  1. Chronic diastolic congestive heart failure: Eber JonesCarolyn had an echocardiogram that reveals normal LV systolic function.  She does have grade 1 diastolic dysfunction.  Her blood pressure is fairly normal.  I suspect most of her shortness of breath with exertion is due to this diastolic dysfunction  Will see her in 6 months   2.  Aortic insufficiency: Moderate by recent echo.  Blood pressure is normal.  No specific therapy at this point.  3.   HTN:   BP is better.   Medication Adjustments/Labs and Tests Ordered: Current medicines are reviewed at length with the patient today.  Concerns regarding medicines are outlined above.  No orders of the defined types were placed in this encounter.  No orders of the defined types were placed in this encounter.   Signed, Kristeen MissPhilip Nahser, MD  12/07/2016 4:24 PM    [facility]

## 2016-12-07 NOTE — Patient Instructions (Signed)
Medication Instructions:  Your physician recommends that you continue on your current medications as directed. Please refer to the Current Medication list given to you today.   Labwork: None Ordered   Testing/Procedures: None Ordered   Follow-Up: Your physician wants you to follow-up in: 6 months with Dr. Nahser.  You will receive a reminder letter in the mail two months in advance. If you don't receive a letter, please call our office to schedule the follow-up appointment.   If you need a refill on your cardiac medications before your next appointment, please call your pharmacy.   Thank you for choosing [facility]! Isidoro Santillana, RN [phone]    

## 2016-12-24 DIAGNOSIS — H903 Sensorineural hearing loss, bilateral: Secondary | ICD-10-CM | POA: Diagnosis not present

## 2017-02-10 DIAGNOSIS — I10 Essential (primary) hypertension: Secondary | ICD-10-CM | POA: Diagnosis not present

## 2017-02-10 DIAGNOSIS — I519 Heart disease, unspecified: Secondary | ICD-10-CM | POA: Diagnosis not present

## 2017-02-10 DIAGNOSIS — M5489 Other dorsalgia: Secondary | ICD-10-CM | POA: Diagnosis not present

## 2017-02-10 DIAGNOSIS — E7849 Other hyperlipidemia: Secondary | ICD-10-CM | POA: Diagnosis not present

## 2017-02-10 DIAGNOSIS — Z6823 Body mass index (BMI) 23.0-23.9, adult: Secondary | ICD-10-CM | POA: Diagnosis not present

## 2017-03-21 DIAGNOSIS — K589 Irritable bowel syndrome without diarrhea: Secondary | ICD-10-CM | POA: Diagnosis not present

## 2017-03-21 DIAGNOSIS — M1990 Unspecified osteoarthritis, unspecified site: Secondary | ICD-10-CM | POA: Diagnosis not present

## 2017-03-21 DIAGNOSIS — H547 Unspecified visual loss: Secondary | ICD-10-CM | POA: Diagnosis not present

## 2017-03-21 DIAGNOSIS — E785 Hyperlipidemia, unspecified: Secondary | ICD-10-CM | POA: Diagnosis not present

## 2017-03-21 DIAGNOSIS — G2581 Restless legs syndrome: Secondary | ICD-10-CM | POA: Diagnosis not present

## 2017-03-21 DIAGNOSIS — M545 Low back pain: Secondary | ICD-10-CM | POA: Diagnosis not present

## 2017-03-21 DIAGNOSIS — I10 Essential (primary) hypertension: Secondary | ICD-10-CM | POA: Diagnosis not present

## 2017-03-21 DIAGNOSIS — H9193 Unspecified hearing loss, bilateral: Secondary | ICD-10-CM | POA: Diagnosis not present

## 2017-03-21 DIAGNOSIS — E876 Hypokalemia: Secondary | ICD-10-CM | POA: Diagnosis not present

## 2017-04-14 DIAGNOSIS — D0461 Carcinoma in situ of skin of right upper limb, including shoulder: Secondary | ICD-10-CM | POA: Diagnosis not present

## 2017-04-14 DIAGNOSIS — L570 Actinic keratosis: Secondary | ICD-10-CM | POA: Diagnosis not present

## 2017-04-14 DIAGNOSIS — L821 Other seborrheic keratosis: Secondary | ICD-10-CM | POA: Diagnosis not present

## 2017-05-27 DIAGNOSIS — R300 Dysuria: Secondary | ICD-10-CM | POA: Diagnosis not present

## 2017-05-27 DIAGNOSIS — N390 Urinary tract infection, site not specified: Secondary | ICD-10-CM | POA: Diagnosis not present

## 2017-06-30 ENCOUNTER — Ambulatory Visit (INDEPENDENT_AMBULATORY_CARE_PROVIDER_SITE_OTHER): Payer: Self-pay | Admitting: *Deleted

## 2017-06-30 DIAGNOSIS — I83893 Varicose veins of bilateral lower extremities with other complications: Secondary | ICD-10-CM

## 2017-06-30 NOTE — Progress Notes (Addendum)
         X=.3% Sotradecol administered with a 27g butterfly.  Patient received a total of 6cc.  Treated all areas of concern. Easy access. Tol well. Anticipate good results. Follow prn.  Photos: Yes.    Compression stockings applied: Yes.

## 2017-07-09 DIAGNOSIS — N8111 Cystocele, midline: Secondary | ICD-10-CM | POA: Diagnosis not present

## 2017-07-09 DIAGNOSIS — N814 Uterovaginal prolapse, unspecified: Secondary | ICD-10-CM | POA: Diagnosis not present

## 2017-07-09 DIAGNOSIS — N952 Postmenopausal atrophic vaginitis: Secondary | ICD-10-CM | POA: Diagnosis not present

## 2017-07-13 DIAGNOSIS — S0501XA Injury of conjunctiva and corneal abrasion without foreign body, right eye, initial encounter: Secondary | ICD-10-CM | POA: Diagnosis not present

## 2017-07-19 DIAGNOSIS — N390 Urinary tract infection, site not specified: Secondary | ICD-10-CM | POA: Diagnosis not present

## 2017-07-19 DIAGNOSIS — R300 Dysuria: Secondary | ICD-10-CM | POA: Diagnosis not present

## 2017-07-29 ENCOUNTER — Encounter: Payer: Self-pay | Admitting: Cardiovascular Disease

## 2017-08-17 ENCOUNTER — Encounter: Payer: Self-pay | Admitting: Cardiovascular Disease

## 2017-08-17 ENCOUNTER — Ambulatory Visit (INDEPENDENT_AMBULATORY_CARE_PROVIDER_SITE_OTHER): Payer: Medicare PPO | Admitting: Cardiovascular Disease

## 2017-08-17 ENCOUNTER — Encounter: Payer: Self-pay | Admitting: Nurse Practitioner

## 2017-08-17 VITALS — BP 120/62 | HR 93 | Ht 63.0 in | Wt 132.8 lb

## 2017-08-17 DIAGNOSIS — I712 Thoracic aortic aneurysm, without rupture, unspecified: Secondary | ICD-10-CM

## 2017-08-17 DIAGNOSIS — I351 Nonrheumatic aortic (valve) insufficiency: Secondary | ICD-10-CM

## 2017-08-17 DIAGNOSIS — I5032 Chronic diastolic (congestive) heart failure: Secondary | ICD-10-CM | POA: Diagnosis not present

## 2017-08-17 LAB — BASIC METABOLIC PANEL
BUN / CREAT RATIO: 18 (ref 12–28)
BUN: 14 mg/dL (ref 8–27)
CALCIUM: 9.8 mg/dL (ref 8.7–10.3)
CHLORIDE: 96 mmol/L (ref 96–106)
CO2: 21 mmol/L (ref 20–29)
Creatinine, Ser: 0.76 mg/dL (ref 0.57–1.00)
GFR calc Af Amer: 83 mL/min/{1.73_m2} (ref >59)
GFR calc non Af Amer: 72 mL/min/{1.73_m2} (ref >59)
Glucose: 111 mg/dL — ABNORMAL HIGH (ref 65–99)
Potassium: 4.6 mmol/L (ref 3.5–5.2)
Sodium: 134 mmol/L (ref 134–144)

## 2017-08-17 NOTE — Progress Notes (Signed)
Cardiology Office Note:    Date:  08/17/2017   ID:  Donna Callahan, DOB 09-13-1930, MRN [id number]  PCP:  Rodrigo Ran, MD  Cardiologist:  Kristeen Miss, MD    Referring MD: Rodrigo Ran, MD   Chief Complaint  Patient presents with  . Congestive Heart Failure    History of Present Illness:    Donna Callahan is a 84 y.o. female with a hx of hypertension, hyperlipidemia, chronic kidney disease stage II, who was seen by Chelsea Aus, PA and me in Sept. For DOE  BP was elevated at that time.    Having lots of GI issues.   Breathing is better.  Gives out when she walks any distance.    Thinks is due to back pain  Is watching her diet   Echo shows normal systolic function with grade 1 diastolic dysfunction.  She has moderate aortic insufficiency with a moderately dilated aorta.  August 17, 2017:  Having some back pain  Unable to walk - has lots of fatigue .   Leg weakness.  No cp or chest tightness or fullness.  Has mod. - severe dilitation of her ascending aorta ( 4.8 cm)  She reports having a brother and sister die of ruptured aortic aneurism   Past Medical History:  Diagnosis Date  . CKD (chronic kidney disease) stage 2, GFR 60-89 ml/min   . Hard of hearing   . High cholesterol   . Hypertension   . Iron deficiency anemia   . Varicose veins     Past Surgical History:  Procedure Laterality Date  . stab phlebectomy Right 04/27/2016   stab phlebectomy right leg by Josephina Gip MD   . stab phlebectomy  Left 05/11/2016   stab phlebectomy > 20 incisions left leg by Josephina Gip MD   . STAPEDECTOMY     bil ears  . TONSILLECTOMY    . TOTAL KNEE ARTHROPLASTY Right 05/02/2014   Procedure: RIGHT TOTAL KNEE ARTHROPLASTY;  Surgeon: Ollen Gross, MD;  Location: [facility];  Service: Orthopedics;  Laterality: Right;  Marland Kitchen VARICOSE VEIN SURGERY      Current Medications: Current Meds  Medication Sig  . acetaminophen (TYLENOL) 500 MG tablet Take 500-1,000 mg by mouth as needed for  moderate pain.   Marland Kitchen BIOTIN PO Take by mouth.  . Calcium-Magnesium-Vitamin D (CALCIUM 1200+D3 PO) Take 1 tablet by mouth 2 (two) times daily.  Marland Kitchen diltiazem (TIAZAC) 240 MG 24 hr capsule Take 240 mg by mouth every morning.   Marland Kitchen doxazosin (CARDURA) 2 MG tablet Take 1 mg by mouth daily.  Marland Kitchen estradiol (ESTRACE VAGINAL) 0.1 MG/GM vaginal cream Place 1 Applicatorful vaginally 3 (three) times a week.  . loperamide (IMODIUM) 2 MG capsule Take by mouth as needed for diarrhea or loose stools.  . Multiple Vitamins-Minerals (CENTRUM SILVER PO) Take 1 tablet by mouth daily.  Marland Kitchen olmesartan (BENICAR) 40 MG tablet TAKE 1 TABLET BY MOUTH EVERY DAY **TO REPLACE IRBESARTAN**  . Polyethyl Glycol-Propyl Glycol (SYSTANE OP) Apply 1 drop to eye every morning.  . potassium chloride SA (KLOR-CON M20) 20 MEQ tablet TAKE 3 TABLETS BY MOUTH EVERY DAY  . psyllium (METAMUCIL) 58.6 % powder Take 1 packet by mouth at bedtime as needed (for constipation).   . simethicone (MYLICON) 80 MG chewable tablet Chew 1 tablet by mouth daily.  . sodium chloride (OCEAN) 0.65 % SOLN nasal spray Place 1-2 sprays into both nostrils every morning.  . triamterene-hydrochlorothiazide (MAXZIDE-25) 37.5-25 MG tablet TAKE 1/2  TABLET BY MOUTH ONCE DAILY     Allergies:   Benadryl [diphenhydramine hcl (sleep)]; Diphenhydramine hcl; Penicillins; Sulfacetamide sodium; Penicillin g; and Sulfa antibiotics   Social History   Socioeconomic History  . Marital status: Widowed    Spouse name: Not on file  . Number of children: Not on file  . Years of education: Not on file  . Highest education level: Not on file  Occupational History  . Not on file  Social Needs  . Financial resource strain: Not on file  . Food insecurity:    Worry: Not on file    Inability: Not on file  . Transportation needs:    Medical: Not on file    Non-medical: Not on file  Tobacco Use  . Smoking status: Never Smoker  . Smokeless tobacco: Never Used  Substance and Sexual  Activity  . Alcohol use: No  . Drug use: No  . Sexual activity: Never    Comment: HUSBAND DIED Jun 10, 2004  Lifestyle  . Physical activity:    Days per week: Not on file    Minutes per session: Not on file  . Stress: Not on file  Relationships  . Social connections:    Talks on phone: Not on file    Gets together: Not on file    Attends religious service: Not on file    Active member of club or organization: Not on file    Attends meetings of clubs or organizations: Not on file    Relationship status: Not on file  Other Topics Concern  . Not on file  Social History Narrative  . Not on file     Family History: The patient's family history includes AAA (abdominal aortic aneurysm) in her other; Aortic dissection in her sister; Bronchiolitis in her mother; Cancer - Lung in her son; Cancer - Prostate in her son; Fibromyalgia in her daughter; Healthy in her grandchild; Heart attack in her father; Hypertension in her mother; Other in her other; Pneumonia in her mother. ROS:   Please see the history of present illness.     All other systems reviewed and are negative.  EKGs/Labs/Other Studies Reviewed:    The following studies were reviewed today:   EKG:    Recent Labs: No results found for requested labs within last 8760 hours.  Recent Lipid Panel No results found for: CHOL, TRIG, HDL, CHOLHDL, VLDL, LDLCALC, LDLDIRECT  Physical Exam:    Physical Exam: Blood pressure 120/62, pulse 93, height 5\' 3"  (1.6 m), weight 132 lb 12.8 oz (60.2 kg), SpO2 94 %.  GEN:   Elderly female,  HEENT: Normal NECK: No JVD; No carotid bruits LYMPHATICS: No lymphadenopathy CARDIAC: RRR , soft systolic and soft diastolic murmur.  Her PMI is very prominent. ( waterhammer pulse )  RESPIRATORY:  Clear to auscultation without rales, wheezing or rhonchi  ABDOMEN: Soft, non-tender, non-distended MUSCULOSKELETAL:  No edema; No deformity , brisk pulses.  SKIN: Warm and dry NEUROLOGIC:  Alert and oriented x  3   ASSESSMENT:    1. Aortic valve insufficiency, etiology of cardiac valve disease unspecified   2. Thoracic aortic aneurysm without rupture (HCC)   3. Chronic diastolic heart failure (HCC)    PLAN:    In order of problems listed above:  1. Chronic diastolic congestive heart failure: Lyndia had an echocardiogram that reveals normal LV systolic function.  She does have grade 1 diastolic dysfunction.  Her blood pressure is fairly normal.  I suspect most of her shortness  of breath with exertion is due to this diastolic dysfunction  2.  Ascending aortic aneurysm: Patient has a ascending aortic aneurysm of 4.8 cm.  She informed that she had a brother and a sister died of a ruptured aortic aneurysm.  We will get a CT angiogram of her a sending aorta. To get an repeat echocardiogram to further evaluate her aortic insufficiency and LV function.  B.  Fatigue/back pain: Her symptoms are somewhat atypical but could represent an anginal equivalent.  We will get a Lexiscan Myoview study for further evaluation.  2.  Aortic insufficiency: Moderate by  echo.   Will repeat echo   .  3.  HTN:   BP is better.   Medication Adjustments/Labs and Tests Ordered: Current medicines are reviewed at length with the patient today.  Concerns regarding medicines are outlined above.  Orders Placed This Encounter  Procedures  . CT ANGIO CHEST AORTA W &/OR WO CONTRAST  . Basic Metabolic Panel (BMET)  . Myocardial Perfusion Imaging  . ECHOCARDIOGRAM COMPLETE   No orders of the defined types were placed in this encounter.   Lafayette Dragon. Signed, Philip Nahser, MD  08/17/2017 11:41 AM    [facility]

## 2017-08-17 NOTE — Patient Instructions (Signed)
Medication Instructions:  Your physician recommends that you continue on your current medications as directed. Please refer to the Current Medication list given to you today.   Labwork: TODAY - basic metabolic panel   Testing/Procedures: Your physician has requested that you have an echocardiogram. Echocardiography is a painless test that uses sound waves to create images of your heart. It provides your doctor with information about the size and shape of your heart and how well your heart's chambers and valves are working. This procedure takes approximately one hour. There are no restrictions for this procedure.  Non-Cardiac CT Angiography (CTA), is a special type of CT scan that uses a computer to produce multi-dimensional views of major blood vessels throughout the body. In CT angiography, a contrast material is injected through an IV to help visualize the blood vessels  Your physician has requested that you have a lexiscan myoview. For further information please visit [email or web address]. Please follow instruction sheet, as given.   Follow-Up: Your physician recommends that you schedule a follow-up appointment in: 3 months with Dr. Elease HashimotoNahser   If you need a refill on your cardiac medications before your next appointment, please call your pharmacy.   Thank you for choosing [facility]! Eligha BridegroomMichelle Swinyer, RN [phone]

## 2017-08-18 ENCOUNTER — Telehealth (HOSPITAL_COMMUNITY): Payer: Self-pay | Admitting: *Deleted

## 2017-08-18 NOTE — Telephone Encounter (Signed)
Patient given detailed instructions per Myocardial Perfusion Study Information Sheet for the test on 08/23/17. Patient notified to arrive 15 minutes early and that it is imperative to arrive on time for appointment to keep from having the test rescheduled.  If you need to cancel or reschedule your appointment, please call the office within 24 hours of your appointment. . Patient verbalized understanding. Jessejames Steelman Jacqueline    

## 2017-08-23 ENCOUNTER — Ambulatory Visit (HOSPITAL_COMMUNITY): Payer: Medicare PPO | Attending: Cardiology

## 2017-08-23 ENCOUNTER — Ambulatory Visit (HOSPITAL_BASED_OUTPATIENT_CLINIC_OR_DEPARTMENT_OTHER): Payer: Medicare PPO

## 2017-08-23 ENCOUNTER — Other Ambulatory Visit: Payer: Self-pay

## 2017-08-23 DIAGNOSIS — N189 Chronic kidney disease, unspecified: Secondary | ICD-10-CM | POA: Diagnosis not present

## 2017-08-23 DIAGNOSIS — I5032 Chronic diastolic (congestive) heart failure: Secondary | ICD-10-CM | POA: Insufficient documentation

## 2017-08-23 DIAGNOSIS — E785 Hyperlipidemia, unspecified: Secondary | ICD-10-CM | POA: Insufficient documentation

## 2017-08-23 DIAGNOSIS — I130 Hypertensive heart and chronic kidney disease with heart failure and stage 1 through stage 4 chronic kidney disease, or unspecified chronic kidney disease: Secondary | ICD-10-CM | POA: Diagnosis not present

## 2017-08-23 DIAGNOSIS — I313 Pericardial effusion (noninflammatory): Secondary | ICD-10-CM | POA: Insufficient documentation

## 2017-08-23 DIAGNOSIS — I351 Nonrheumatic aortic (valve) insufficiency: Secondary | ICD-10-CM | POA: Diagnosis not present

## 2017-08-23 DIAGNOSIS — I712 Thoracic aortic aneurysm, without rupture, unspecified: Secondary | ICD-10-CM

## 2017-08-23 LAB — MYOCARDIAL PERFUSION IMAGING
CHL CUP NUCLEAR SDS: 6
CHL CUP RESTING HR STRESS: 59 {beats}/min
LHR: 0.35
LV dias vol: 113 mL (ref 46–106)
LVSYSVOL: 38 mL
Peak HR: 75 {beats}/min
SRS: 4
SSS: 8
TID: 1.02

## 2017-08-23 MED ORDER — TECHNETIUM TC 99M TETROFOSMIN IV KIT
10.1000 | PACK | Freq: Once | INTRAVENOUS | Status: AC | PRN
  Administered 2017-08-23: 10.1 via INTRAVENOUS
  Filled 2017-08-23: qty 11

## 2017-08-23 MED ORDER — REGADENOSON 0.4 MG/5ML IV SOLN
0.4000 mg | Freq: Once | INTRAVENOUS | Status: AC
  Administered 2017-08-23: 0.4 mg via INTRAVENOUS

## 2017-08-23 MED ORDER — TECHNETIUM TC 99M TETROFOSMIN IV KIT
31.3000 | PACK | Freq: Once | INTRAVENOUS | Status: AC | PRN
  Administered 2017-08-23: 31.3 via INTRAVENOUS
  Filled 2017-08-23: qty 32

## 2017-08-27 ENCOUNTER — Telehealth: Payer: Self-pay | Admitting: Nurse Practitioner

## 2017-08-27 ENCOUNTER — Ambulatory Visit (INDEPENDENT_AMBULATORY_CARE_PROVIDER_SITE_OTHER)
Admission: RE | Admit: 2017-08-27 | Discharge: 2017-08-27 | Disposition: A | Discharge status: Home / Self Care | Payer: Medicare PPO | Source: Ambulatory Visit | Attending: Cardiovascular Disease | Admitting: Cardiovascular Disease

## 2017-08-27 DIAGNOSIS — I712 Thoracic aortic aneurysm, without rupture, unspecified: Secondary | ICD-10-CM

## 2017-08-27 MED ORDER — IOPAMIDOL (ISOVUE-370) INJECTION 76%
100.0000 mL | Freq: Once | INTRAVENOUS | Status: AC | PRN
  Administered 2017-08-27: 100 mL via INTRAVENOUS

## 2017-08-27 NOTE — Telephone Encounter (Signed)
Results reviewed with patient and questions answered. Patient is concerned because her sister had just set out flower pots prior to her aneurysm rupture. I advised her not to do any heavy lifting or frequent bending. She states she has back discomfort frequently that she thinks is associated with hx of scoliosis. I advised her to monitor for tearing chest or back pain that is different or concerning and to call 911 if that occurs. I advised that Dr. Dennie MaizesGerhardt's office will call her with an appointment. The referral is in epic. Patient verbalized agreement with plan and thanked me for the call.

## 2017-08-27 NOTE — Telephone Encounter (Signed)
-----   Message from Donna MixerPhilip J Nahser, MD sent at 08/27/2017  2:33 PM EDT ----- Please refer to Dr. Ofilia NeasEd Gerhardt for further evaluation Patient has a ascending aortic aneurysm of 4.8 cm.  She  had a brother and a sister died of a ruptured aortic aneurysm  Thanks

## 2017-09-07 ENCOUNTER — Institutional Professional Consult (permissible substitution) (INDEPENDENT_AMBULATORY_CARE_PROVIDER_SITE_OTHER): Payer: Medicare PPO | Admitting: Cardiothoracic Surgery

## 2017-09-07 VITALS — BP 139/77 | HR 74 | Resp 18 | Ht 63.0 in | Wt 134.0 lb

## 2017-09-07 DIAGNOSIS — I712 Thoracic aortic aneurysm, without rupture: Secondary | ICD-10-CM

## 2017-09-07 DIAGNOSIS — I7121 Aneurysm of the ascending aorta, without rupture: Secondary | ICD-10-CM

## 2017-09-07 NOTE — Progress Notes (Signed)
[address]       [place] [address]             [phone]                    [name] [facility] Medical Record #[id number] Date of Birth: [birth year]  Referring: Callahan, Donna Ping, MD Primary Care: Donna Ran, MD Primary Cardiologist: Donna Miss, MD  Chief Complaint:    Chief Complaint  Patient presents with  . Thoracic Aortic Aneurysm    Surgical eval, Chest CTA 08/27/17, ECHO 08/23/17    History of Present Illness:    Donna Callahan 84 y.o. female is seen in the office  today for dilated ascending aorta.  The patient is followed by Dr. Elease Callahan.  Because of increasing shortness of breath and fatigue with exertion the patient was evaluated by cardiology.  Echocardiogram and a CTA was performed, patient was referred because of a 4.7 cm dilated ascending aorta.  The patient has a positive family history of aneurysm disease her sister was followed with an ascending aorta dilatation and died of acute aortic dissection in 2008 at age 68 patient's father died suddenly at age 54 in the 22s, she has one nephew currently followed in our office for a dilated ascending aorta.  [place] and does some light work around her house.   Current Activity/ Functional Status:  Patient is independent with mobility/ambulation, transfers, ADL's, IADL's.   Zubrod Score: At the time of surgery this patient's most appropriate activity status/level should be described as: []     0    Normal activity, no symptoms [x]     1    Restricted in physical strenuous activity but ambulatory, able to do out light work []     2    Ambulatory and capable of self care, unable to do work activities, up and about               >50 % of waking hours                              []     3    Only limited self care, in bed greater than 50% of waking hours []     4    Completely disabled, no self care, confined to bed or chair []     5    Moribund   Past  Medical History:  Diagnosis Date  . CKD (chronic kidney disease) stage 2, GFR 60-89 ml/min   . Hard of hearing   . High cholesterol   . Hypertension   . Iron deficiency anemia   . Varicose veins     Past Surgical History:  Procedure Laterality Date  . stab phlebectomy Right 04/27/2016   stab phlebectomy right leg by Donna Gip MD   . stab phlebectomy  Left 05/11/2016   stab phlebectomy > 20 incisions left leg by Donna Gip MD   . STAPEDECTOMY     bil ears  . TONSILLECTOMY    . TOTAL KNEE ARTHROPLASTY Right 05/02/2014   Procedure: RIGHT TOTAL KNEE ARTHROPLASTY;  Surgeon: Donna Gross, MD;  Location: [facility];  Service: Orthopedics;  Laterality: Right;  Marland Kitchen VARICOSE VEIN SURGERY      Family History  Problem Relation Age of Onset  . Hypertension Mother   . Bronchiolitis Mother   . Pneumonia Mother   .  Heart attack Father   . Aortic dissection Sister   . AAA (abdominal aortic aneurysm) Other        3 OTHER SIBLINGS DIED FOR THIS  . Other Other        MOTOR VEHICLE ACC  . Cancer - Lung Son        11/2011  . Cancer - Prostate Son        01/17/2013  . Fibromyalgia Daughter   . Healthy Grandchild        GREAT GRAND CHILDREN   Patient's nephew Donna Callahan is followed for dilated a sending aorta and has had previous coronary artery bypass grafting  Patient's sister Donna Callahan was followed with a dilated ascending aorta by Dr. Virgel Callahan and died at age 8 with a acute aortic dissection  Social History   Tobacco Use  Smoking Status Never Smoker  Smokeless Tobacco Never Used    Social History   Substance and Sexual Activity  Alcohol Use No     Allergies  Allergen Reactions  . Benadryl [Diphenhydramine Hcl (Sleep)] Other (See Comments)    Paradoxical response, hyperactivity  . Diphenhydramine Hcl Other (See Comments)    Paradoxical response, hyperactivity  . Penicillins Hives  . Sulfacetamide Sodium Nausea And Vomiting  . Penicillin G Rash  . Sulfa Antibiotics  Nausea And Vomiting and Rash    Current Outpatient Medications  Medication Sig Dispense Refill  . acetaminophen (TYLENOL) 500 MG tablet Take 500-1,000 mg by mouth as needed for moderate pain.     Marland Kitchen BIOTIN PO Take by mouth.    . Calcium-Magnesium-Vitamin D (CALCIUM 1200+D3 PO) Take 1 tablet by mouth 2 (two) times daily.    Marland Kitchen diltiazem (TIAZAC) 240 MG 24 hr capsule Take 240 mg by mouth every morning.     Marland Kitchen doxazosin (CARDURA) 2 MG tablet Take 1 mg by mouth daily.    Marland Kitchen estradiol (ESTRACE VAGINAL) 0.1 MG/GM vaginal cream Place 1 Applicatorful vaginally 3 (three) times a week.    . loperamide (IMODIUM) 2 MG capsule Take by mouth as needed for diarrhea or loose stools.    . Multiple Vitamins-Minerals (CENTRUM SILVER PO) Take 1 tablet by mouth daily.    Marland Kitchen olmesartan (BENICAR) 40 MG tablet TAKE 1 TABLET BY MOUTH EVERY DAY **TO REPLACE IRBESARTAN**  3  . Polyethyl Glycol-Propyl Glycol (SYSTANE OP) Apply 1 drop to eye every morning.    . potassium chloride SA (KLOR-CON M20) 20 MEQ tablet TAKE 3 TABLETS BY MOUTH EVERY DAY    . psyllium (METAMUCIL) 58.6 % powder Take 1 packet by mouth at bedtime as needed (for constipation).     . simethicone (MYLICON) 80 MG chewable tablet Chew 1 tablet by mouth daily.    . sodium chloride (OCEAN) 0.65 % SOLN nasal spray Place 1-2 sprays into both nostrils every morning.    . triamterene-hydrochlorothiazide (MAXZIDE-25) 37.5-25 MG tablet TAKE 1/2  TABLET BY MOUTH ONCE DAILY     No current facility-administered medications for this visit.     Pertinent items are noted in HPI.   Review of Systems:     Cardiac Review of Systems: [Y] = yes  or   [ N ] = no   Chest Pain [ n   ]  Resting SOB [  n ] Exertional SOB  [  y]  Orthopnea Milo.Brash  ]   Pedal Edema [ n  ]    Palpitations [ n ] Syncope  [n  ]   Presyncope [  n  ]   General Review of Systems: [Y] = yes [  ]=no Constitional: recent weight change [  ];  Wt loss over the last 3 months [   ] anorexia [  ]; fatigue [  ];  nausea [  ]; night sweats [  ]; fever [  ]; or chills [  ];           Eye : blurred vision [  ]; diplopia [   ]; vision changes [  ];  Amaurosis fugax[  ]; Resp: cough [  ];  wheezing[  ];  hemoptysis[  ]; shortness of breath[  ]; paroxysmal nocturnal dyspnea[  ]; dyspnea on exertion[  ]; or orthopnea[  ];  GI:  gallstones[  ], vomiting[  ];  dysphagia[  ]; melena[  ];  hematochezia [  ]; heartburn[  ];   Hx of  Colonoscopy[  ]; GU: kidney stones [  ]; hematuria[  ];   dysuria [ y ];  nocturia[  ];  history of     obstruction [  ]; urinary frequency [  ]             Skin: rash, swelling[  ];, hair loss[  ];  peripheral edema[  ];  or itching[  ]; Musculosketetal: myalgias[  ];  joint swelling[  ];  joint erythema[  ];  joint pain[  ];  back pain[  ];  Heme/Lymph: bruising[  ];  bleeding[  ];  anemia[  ];  Neuro: TIA[  ];  headaches[  ];  stroke[  ];  vertigo[  ];  seizures[  ];   paresthesias[  ];  difficulty walking[ y ];  Psych:depression[  ]; anxiety[  ];  Endocrine: diabetes[  ];  thyroid dysfunction[  ];  Immunizations: Flu up to date [ y ]; Pneumococcal up to date [place]ove.Etienne[y  ];  Other:    PHYSICAL EXAMINATION: BP 139/77   Pulse 74   Resp 18   Ht 5\' 3"  (1.6 m)   Wt 134 lb (60.8 kg)   SpO2 96% Comment: RA  BMI 23.74 kg/m  General appearance: alert, cooperative, appears stated age and no distress Head: Normocephalic, without obvious abnormality, atraumatic Neck: no adenopathy, no carotid bruit, no JVD, supple, symmetrical, trachea midline and thyroid not enlarged, symmetric, no tenderness/mass/nodules Lymph nodes: Cervical, supraclavicular, and axillary nodes normal. Resp: clear to auscultation bilaterally Back: symmetric, no curvature. ROM normal. No CVA tenderness. Cardio: diastolic murmur: mid diastolic 2/6, crescendo and decrescendo at 2nd right intercostal space GI: soft, non-tender; bowel sounds normal; no masses,  no organomegaly Extremities: extremities normal, atraumatic, no  cyanosis or edema and Homans sign is negative, no sign of DVT Neurologic: Grossly normal No palpable abdominal or popliteal aneurysms bilaterally Palpable DP PT pulses bilaterally Diagnostic Studies & Laboratory data:     Recent Radiology Findings:   Ct Angio Chest Aorta W &/or Wo Contrast  Result Date: 08/27/2017 CLINICAL DATA:  Follow-up thoracic aortic aneurysm. EXAM: CT ANGIOGRAPHY CHEST WITH CONTRAST TECHNIQUE: Multidetector CT imaging of the chest was performed using the standard protocol during bolus administration of intravenous contrast. Multiplanar CT image reconstructions and MIPs were obtained to evaluate the vascular anatomy. CONTRAST:  100mL ISOVUE-370 IOPAMIDOL (ISOVUE-370) INJECTION 76% COMPARISON:  CT abdomen pelvis-07/23/2014 FINDINGS: Vascular Findings: Fusiform aneurysmal dilatation of the ascending thoracic aorta was measurements as follows. The thoracic aorta tapers to a normal caliber at the level of the aortic arch. The descending thoracic aorta is  mildly tortuous but of normal caliber. No evidence of thoracic aortic dissection or periaortic stranding on this nongated examination. Conventional configuration of the aortic arch. The branch vessels of the aortic arch appear widely patent throughout their imaged course. Cardiomegaly. Coronary artery calcifications. Small amount of fluid is seen within the pericardial recess. No pericardial effusion. Although this examination was not tailored for the evaluation the pulmonary arteries, there are no discrete filling defects within the central pulmonary arterial tree to suggest central pulmonary embolism. Normal caliber the main pulmonary artery. 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  ------------------------------------------------------------- Thoracic aortic measurements: Sinotubular junction 41 mm as measured in greatest oblique coronal dimension. Proximal ascending aorta 48 mm as measured in greatest oblique axial dimension at the level of the main pulmonary artery (image 51, series 4) and approximately 50 mm in greatest oblique short axis coronal diameter (coronal image 34, series 7). Aortic arch aorta 29 mm as measured in greatest oblique sagittal dimension. Proximal descending thoracic aorta 27 mm as measured in greatest oblique axial dimension at the level of the main pulmonary artery. Distal descending thoracic aorta 27 mm as measured in greatest oblique axial dimension at the level of the diaphragmatic hiatus. Review of the MIP images confirms the above findings. ------------------------------------------------------------- Non-Vascular Findings: Mediastinum/Lymph Nodes: No bulky mediastinal, hilar axillary lymphadenopathy. Lungs/Pleura: Minimal dependent subpleural ground-glass atelectasis. Minimal subsegmental atelectasis within the left lower lobe. No discrete focal airspace opacities. No pleural effusion or pneumothorax. No discrete pulmonary nodules. The central pulmonary airways appear widely patent. Upper abdomen: Limited early arterial phase evaluation of the upper abdomen demonstrates enlargement of the caliber of the central aspect of the pancreatic duct measuring approximately 5 mm in diameter (image 109, series 4, similar to remote abdominal CT performed 07/2014. Musculoskeletal: No acute or aggressive osseous abnormalities. Moderate rotatory scoliotic curvature of the thoracolumbar spine with dominant mid component convex to the left with associated moderate severe DDD within the central component of the concavity. Regional soft tissues appear normal. Mildly heterogeneous appearing thyroid gland. Note is made of an approximately 0.5 cm dystrophic calcification within the  left lobe of the thyroid. IMPRESSION: 1. Uncomplicated fusiform aneurysmal dilatation of the ascending thoracic aorta measuring 50 mm in diameter. Aortic aneurysm NOS (ICD10-I71.9). 2. Coronary artery calcifications. Aortic Atherosclerosis (ICD10-I70.0). 3. 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 Electronically Signed   By: Simonne ComeJohn  Watts M.D.   On: 08/27/2017 10:26     I have independently reviewed the above radiology studies  and reviewed the findings with the patient.   ECHO 08/23/2017: Transthoracic Echocardiography  Patient:    Donna Callahan, Donna Callahan MR #:       [id number] Study Date: 08/23/2017 Gender:     F Age:        84 Height:     160 cm Weight:     60.2 kg BSA:        1.64 m^2 Pt. Status: Room:   REFERRING    Donna MissPhilip Callahan, M.D.  ATTENDING    Olga MillersBrian Crenshaw  SONOGRAPHER  Clearence Pedammie Crouch, RCS  PERFORMING   [facility]  ORDERING     Callahan, Elberta SpanielJr  REFERRING    Callahan, Jr  cc:  -------------------------------------------------------------------  LV EF: 50% -   55%  ------------------------------------------------------------------- Indications:      Thoracic Aortic Aneurysm without rupture (Z61).   ------------------------------------------------------------------- History:   PMH:  CKD, HLD. old echo 9/18 ascending aorta 48mm, mod ai and there was no effusion oppose to some effusion on this study. HLD. CKD.  Risk factors:  Hypertension.  ------------------------------------------------------------------- Study Conclusions  - Left ventricle: The cavity size was normal. There was mild focal   basal hypertrophy of the septum. Systolic function was normal.   The estimated ejection fraction was in the range of 50% to 55%.   Wall motion was normal; there were no regional wall motion   abnormalities. Doppler parameters are consistent with  abnormal   left ventricular relaxation (grade 1 diastolic dysfunction). - Aortic valve: There was mild regurgitation. - Mitral valve: There was mild regurgitation. - Left atrium: The atrium was moderately dilated. - Pericardium, extracardiac: A small pericardial effusion was   identified.  Impressions:  - Normal LV systolic function; mild diastolic dysfunction; mild AI;   severely dilated ascending aorta (4.9 cm; suggest CTA to further   assess); mild MR; moderate LAE; small pericardial effusion.  ------------------------------------------------------------------- Study data:  Comparison was made to the study of 10/13/2016.  Study status:  Routine.  Procedure:  The patient reported no pain pre or post test. Transthoracic echocardiography. Image quality was adequate.          Transthoracic echocardiography.  M-mode, complete 2D, spectral Doppler, and color Doppler.  Birthdate: Patient birthdate: 1930/02/24.  Age:  Patient is 84 yr old.  Sex: Gender: female.    BMI: 23.5 kg/m^2.  Blood pressure:     120/62 Patient status:  Outpatient.  Study date:  Study date: 08/23/2017. Study time: 07:49 AM.  Location:  [facility]  -------------------------------------------------------------------  ------------------------------------------------------------------- Left ventricle:  The cavity size was normal. There was mild focal basal hypertrophy of the septum. Systolic function was normal. The estimated ejection fraction was in the range of 50% to 55%. Wall motion was normal; there were no regional wall motion abnormalities. Doppler parameters are consistent with abnormal left ventricular relaxation (grade 1 diastolic dysfunction).  ------------------------------------------------------------------- Aortic valve:   Trileaflet; normal thickness leaflets. Mobility was not restricted.  Doppler:  Transvalvular velocity was within the normal range. There was no stenosis. There was  mild regurgitation.   ------------------------------------------------------------------- Aorta:  Aortic root: The aortic root was normal in size. Ascending aorta: The ascending aorta was severely dilated.  ------------------------------------------------------------------- Mitral valve:   Structurally normal valve.   Mobility was not restricted.  Doppler:  Transvalvular velocity was within the normal range. There was no evidence for stenosis. There was mild regurgitation.  ------------------------------------------------------------------- Left atrium:  The atrium was moderately dilated.  ------------------------------------------------------------------- Right ventricle:  The cavity size was normal. Systolic function was normal.  ------------------------------------------------------------------- Pulmonic valve:    Doppler:  Transvalvular velocity was within the normal range. There was no evidence for stenosis.  ------------------------------------------------------------------- Tricuspid valve:   Structurally normal valve.    Doppler: Transvalvular velocity was within the normal range. There was trivial regurgitation.  ------------------------------------------------------------------- Pulmonary artery:   Systolic pressure was within the normal range.   ------------------------------------------------------------------- Right atrium:  The atrium was normal in size.  ------------------------------------------------------------------- Pericardium:  A small pericardial effusion was identified.  ------------------------------------------------------------------- Systemic veins: Inferior vena cava: The vessel was normal in size. The respirophasic diameter changes were in the normal range (= 50%), consistent with normal central venous pressure. Diameter: 16 mm.  ------------------------------------------------------------------- Measurements  IVC                                         Value        Reference  ID                                         16    mm     ----------    Left ventricle                             Value        Reference  LV ID, ED, PLAX chordal                    50.2  mm     43 - 52  LV ID, ES, PLAX chordal                    36    mm     23 - 38  LV fx shortening, PLAX chordal     (L)     28    %      >=29  LV PW thickness, ED                        10.22 mm     ----------  IVS/LV PW ratio, ED                (H)     1.31         <=1.3  Stroke volume, 2D                          104   ml     ----------  Stroke volume/bsa, 2D                      63    ml/m^2 ----------  LV e&', lateral                             9.36  cm/s   ----------  LV E/e&', lateral                           5.6          ----------  LV e&', medial                              3.81  cm/s   ----------  LV E/e&', medial                            13.75        ----------  LV e&', average                             6.59  cm/s   ----------  LV E/e&', average  7.96         ----------    Ventricular septum                         Value        Reference  IVS thickness, ED                          13.43 mm     ----------    LVOT                                       Value        Reference  LVOT ID, S                                 22    mm     ----------  LVOT area                                  3.8   cm^2   ----------  LVOT peak velocity, S                      104   cm/s   ----------  LVOT mean velocity, S                      72.7  cm/s   ----------  LVOT VTI, S                                27.3  cm     ----------    Aortic valve                               Value        Reference  Aortic regurg pressure half-time           497   ms     ----------    Aorta                                      Value        Reference  Aortic root ID, ED                         37    mm     ----------    Left atrium                                 Value        Reference  LA ID, A-P, ES                             28    mm     ----------  LA ID/bsa, A-P  1.7   cm/m^2 <=2.2  LA volume, S                               61.7  ml     ----------  LA volume/bsa, S                           37.5  ml/m^2 ----------  LA volume, ES, 1-p A4C                     77.4  ml     ----------  LA volume/bsa, ES, 1-p A4C                 47.1  ml/m^2 ----------  LA volume, ES, 1-p A2C                     49.6  ml     ----------  LA volume/bsa, ES, 1-p A2C                 30.2  ml/m^2 ----------    Mitral valve                               Value        Reference  Mitral E-wave peak velocity                52.4  cm/s   ----------  Mitral A-wave peak velocity                109   cm/s   ----------  Mitral deceleration time           (H)     232   ms     150 - 230  Mitral E/A ratio, peak                     0.5          ----------    Pulmonary arteries                         Value        Reference  PA pressure, S, DP                         25    mm Hg  <=30    Tricuspid valve                            Value        Reference  Tricuspid regurg peak velocity             236   cm/s   ----------  Tricuspid peak RV-RA gradient              22    mm Hg  ----------  Tricuspid maximal regurg velocity,         236   cm/s   ----------  PISA    Right atrium                               Value        Reference  RA ID, S-I,  ES, A4C                (H)     49.4  mm     34 - 49  RA area, ES, A4C                           12.7  cm^2   8.3 - 19.5  RA volume, ES, A/L                         26.4  ml     ----------  RA volume/bsa, ES, A/L                     16.1  ml/m^2 ----------    Systemic veins                             Value        Reference  Estimated CVP                              3     mm Hg  ----------    Right ventricle                            Value        Reference  TAPSE                                      21.7  mm      ----------  RV pressure, S, DP                         25    mm Hg  <=30  RV s&', lateral, S                          11.2  cm/s   ----------  Legend: (L)  and  (H)  mark values outside specified reference range.  ------------------------------------------------------------------- Prepared and Electronically Authenticated by  Olga Millers 2019-08-05T15:13:31   Recent Lab Findings: Lab Results  Component Value Date   WBC 5.7 07/26/2014   HGB 10.0 (L) 07/26/2014   HCT 30.8 (L) 07/26/2014   PLT 217 07/26/2014   GLUCOSE 111 (H) 08/17/2017   ALT 17 07/24/2014   AST 17 07/24/2014   NA 134 08/17/2017   K 4.6 08/17/2017   CL 96 08/17/2017   CREATININE 0.76 08/17/2017   BUN 14 08/17/2017   CO2 21 08/17/2017   INR 1.05 07/23/2014   Aortic Size Index=     5.0     /Body surface area is 1.64 meters squared. =3.04  < 2.75 cm/m2      4% risk per year 2.75 to 4.25          8% risk per year > 4.25 cm/m2    20% risk per year  Aortic Cross section area/ Height ratio=   Chronic Kidney Disease   Stage I     GFR >90  Stage II    GFR 60-89  Stage IIIA GFR 45-59  Stage IIIB GFR 30-44  Stage IV   GFR 15-29  Stage V    GFR  <15  Lab Results  Component Value Date   CREATININE 0.76 08/17/2017   CrCl cannot be calculated (Patient's most recent lab result is older than the maximum 21 days allowed.).  Assessment / Plan:   Patient is an 84 year old female with a positive family history of aortic dissection with a 4.7 cm dilated aorta and a relatively small body size.  I reviewed with the patient the CT scan the diagnosis of dilated ascending aorta and risk of aortic dissection.  The patient is at moderate risk, but she notes that her age of 88 years she is not interested in any major surgical intervention.  She would like to follow the size to know if there is any changes.  We will plan to see her back with a CTA in 6 months.  I have cautioned her about strenuous lifting, told her to  avoid quinolones, and good blood pressure control is important.  She does note that she has a living will.     I  spent 60 minutes with  the patient face to face and greater then 50% of the time was spent in counseling and coordination of care.    Delight Ovens MD      [address] [place] [place] [address] Office [phone]   Beeper [phone]  09/07/2017 2:13 PM

## 2017-09-07 NOTE — Patient Instructions (Signed)

## 2017-09-08 ENCOUNTER — Encounter: Payer: Medicare PPO | Admitting: Cardiothoracic Surgery

## 2017-09-15 ENCOUNTER — Encounter: Payer: Medicare PPO | Admitting: Surgery

## 2017-09-21 DIAGNOSIS — R82998 Other abnormal findings in urine: Secondary | ICD-10-CM | POA: Diagnosis not present

## 2017-09-21 DIAGNOSIS — I10 Essential (primary) hypertension: Secondary | ICD-10-CM | POA: Diagnosis not present

## 2017-09-21 DIAGNOSIS — E7849 Other hyperlipidemia: Secondary | ICD-10-CM | POA: Diagnosis not present

## 2017-09-21 DIAGNOSIS — M859 Disorder of bone density and structure, unspecified: Secondary | ICD-10-CM | POA: Diagnosis not present

## 2017-09-27 DIAGNOSIS — N182 Chronic kidney disease, stage 2 (mild): Secondary | ICD-10-CM | POA: Diagnosis not present

## 2017-09-27 DIAGNOSIS — R0609 Other forms of dyspnea: Secondary | ICD-10-CM | POA: Diagnosis not present

## 2017-09-27 DIAGNOSIS — K580 Irritable bowel syndrome with diarrhea: Secondary | ICD-10-CM | POA: Diagnosis not present

## 2017-09-27 DIAGNOSIS — N8189 Other female genital prolapse: Secondary | ICD-10-CM | POA: Diagnosis not present

## 2017-09-27 DIAGNOSIS — I519 Heart disease, unspecified: Secondary | ICD-10-CM | POA: Diagnosis not present

## 2017-09-27 DIAGNOSIS — I700 Atherosclerosis of aorta: Secondary | ICD-10-CM | POA: Diagnosis not present

## 2017-09-27 DIAGNOSIS — I8310 Varicose veins of unspecified lower extremity with inflammation: Secondary | ICD-10-CM | POA: Diagnosis not present

## 2017-09-27 DIAGNOSIS — Z0000 Encounter for general adult medical examination without abnormal findings: Secondary | ICD-10-CM | POA: Diagnosis not present

## 2017-09-27 DIAGNOSIS — Z23 Encounter for immunization: Secondary | ICD-10-CM | POA: Diagnosis not present

## 2017-09-27 DIAGNOSIS — I8390 Asymptomatic varicose veins of unspecified lower extremity: Secondary | ICD-10-CM | POA: Diagnosis not present

## 2017-09-30 DIAGNOSIS — Z821 Family history of blindness and visual loss: Secondary | ICD-10-CM | POA: Diagnosis not present

## 2017-09-30 DIAGNOSIS — H40013 Open angle with borderline findings, low risk, bilateral: Secondary | ICD-10-CM | POA: Diagnosis not present

## 2017-10-01 DIAGNOSIS — Z1212 Encounter for screening for malignant neoplasm of rectum: Secondary | ICD-10-CM | POA: Diagnosis not present

## 2017-11-04 DIAGNOSIS — Z1231 Encounter for screening mammogram for malignant neoplasm of breast: Secondary | ICD-10-CM | POA: Diagnosis not present

## 2017-11-17 ENCOUNTER — Encounter: Payer: Self-pay | Admitting: Cardiovascular Disease

## 2017-11-17 ENCOUNTER — Ambulatory Visit (INDEPENDENT_AMBULATORY_CARE_PROVIDER_SITE_OTHER): Payer: Medicare PPO | Admitting: Cardiovascular Disease

## 2017-11-17 VITALS — BP 162/88 | HR 77 | Ht 63.0 in | Wt 129.8 lb

## 2017-11-17 DIAGNOSIS — I712 Thoracic aortic aneurysm, without rupture: Secondary | ICD-10-CM

## 2017-11-17 DIAGNOSIS — I351 Nonrheumatic aortic (valve) insufficiency: Secondary | ICD-10-CM

## 2017-11-17 DIAGNOSIS — I7121 Aneurysm of the ascending aorta, without rupture: Secondary | ICD-10-CM

## 2017-11-17 DIAGNOSIS — I10 Essential (primary) hypertension: Secondary | ICD-10-CM | POA: Diagnosis not present

## 2017-11-17 MED ORDER — CARVEDILOL 12.5 MG PO TABS: 12.5000 mg | ORAL_TABLET | Freq: Two times a day (BID) | ORAL | 3 refills | Status: DC

## 2017-11-17 NOTE — Patient Instructions (Signed)
Medication Instructions:  Your physician has recommended you make the following change in your medication:   STOP Diltiazem START Carvedilol (Coreg) 12.5 mg twice daily  If you need a refill on your cardiac medications before your next appointment, please call your pharmacy.   Lab work: None Ordered    Testing/Procedures: None Ordered   Follow-Up: At [facility], you and your health needs are our priority.  As part of our continuing mission to provide you with exceptional heart care, we have created designated Provider Care Teams.  These Care Teams include your primary Cardiologist (physician) and Advanced Practice Providers (APPs -  Physician Assistants and Nurse Practitioners) who all work together to provide you with the care you need, when you need it. You will need a follow up appointment in:  3 months. You may see Kristeen Miss, MD or one of the following Advanced Practice Providers on your designated Care Team: Tereso Newcomer, PA-C [place], [place] . Berton Bon, NP

## 2017-11-17 NOTE — Progress Notes (Signed)
Cardiology Office Note:    Date:  11/17/2017   ID:  Donna Callahan, DOB Feb 21, 1930, MRN [id number]  PCP:  Crist Infante, MD  Cardiologist:  Mertie Moores, MD    Referring MD: Crist Infante, MD   Chief Complaint  Patient presents with  . Congestive Heart Failure    History of Present Illness:    Donna Callahan is a 84 y.o. female with a hx of hypertension, hyperlipidemia, chronic kidney disease stage II, who was seen by Robbie Lis, PA and me in Sept. For DOE  BP was elevated at that time.    Having lots of GI issues.   Breathing is better.  Gives out when she walks any distance.    Thinks is due to back pain  Is watching her diet   Echo shows normal systolic function with grade 1 diastolic dysfunction.  She has moderate aortic insufficiency with a moderately dilated aorta.  August 17, 2017:  Having some back pain  Unable to walk - has lots of fatigue .   Leg weakness.  No cp or chest tightness or fullness.  Has mod. - severe dilitation of her ascending aorta ( 4.8 cm)  She reports having a brother and sister die of ruptured aortic aneurism   November 17, 2017: Donna Callahan is seen today.  She has a history of diastolic congestive heart failure.  She has a ascending aortic aneurysm.  She has seen Dr. Servando Snare.  She is not interested in any surgical intervention.  BP is a bit high today  Advised her to avoid salty foods    Past Medical History:  Diagnosis Date  . CKD (chronic kidney disease) stage 2, GFR 60-89 ml/min   . Hard of hearing   . High cholesterol   . Hypertension   . Iron deficiency anemia   . Varicose veins     Past Surgical History:  Procedure Laterality Date  . stab phlebectomy Right 04/27/2016   stab phlebectomy right leg by Tinnie Gens MD   . stab phlebectomy  Left 05/11/2016   stab phlebectomy > 20 incisions left leg by Tinnie Gens MD   . STAPEDECTOMY     bil ears  . TONSILLECTOMY    . TOTAL KNEE ARTHROPLASTY Right 05/02/2014   Procedure: RIGHT  TOTAL KNEE ARTHROPLASTY;  Surgeon: Gaynelle Arabian, MD;  Location: [facility];  Service: Orthopedics;  Laterality: Right;  Marland Kitchen VARICOSE VEIN SURGERY      Current Medications: Current Meds  Medication Sig  . acetaminophen (TYLENOL) 500 MG tablet Take 500-1,000 mg by mouth as needed for moderate pain.   Marland Kitchen BIOTIN PO Take by mouth.  . Calcium-Magnesium-Vitamin D (CALCIUM 1200+D3 PO) Take 1 tablet by mouth 2 (two) times daily.  Marland Kitchen doxazosin (CARDURA) 2 MG tablet Take 1 mg by mouth daily.  Marland Kitchen estradiol (ESTRACE VAGINAL) 0.1 MG/GM vaginal cream Place 1 Applicatorful vaginally 3 (three) times a week.  . loperamide (IMODIUM) 2 MG capsule Take by mouth as needed for diarrhea or loose stools.  . Multiple Vitamins-Minerals (CENTRUM SILVER PO) Take 1 tablet by mouth daily.  Marland Kitchen olmesartan (BENICAR) 40 MG tablet TAKE 1 TABLET BY MOUTH EVERY DAY **TO REPLACE IRBESARTAN**  . Polyethyl Glycol-Propyl Glycol (SYSTANE OP) Apply 1 drop to eye every morning.  . potassium chloride SA (KLOR-CON M20) 20 MEQ tablet TAKE 3 TABLETS BY MOUTH EVERY DAY  . psyllium (METAMUCIL) 58.6 % powder Take 1 packet by mouth at bedtime as needed (for constipation).   Marland Kitchen  simethicone (MYLICON) 80 MG chewable tablet Chew 1 tablet by mouth daily.  . sodium chloride (OCEAN) 0.65 % SOLN nasal spray Place 1-2 sprays into both nostrils every morning.  . triamterene-hydrochlorothiazide (MAXZIDE-25) 37.5-25 MG tablet TAKE 1/2  TABLET BY MOUTH ONCE DAILY  . [DISCONTINUED] diltiazem (TIAZAC) 240 MG 24 hr capsule Take 240 mg by mouth every morning.      Allergies:   Benadryl [diphenhydramine hcl (sleep)]; Diphenhydramine hcl; Penicillins; Quinolones; Sulfacetamide sodium; Penicillin g; and Sulfa antibiotics   Social History   Socioeconomic History  . Marital status: Widowed    Spouse name: Not on file  . Number of children: Not on file  . Years of education: Not on file  . Highest education level: Not on file  Occupational History  . Not on file    Social Needs  . Financial resource strain: Not on file  . Food insecurity:    Worry: Not on file    Inability: Not on file  . Transportation needs:    Medical: Not on file    Non-medical: Not on file  Tobacco Use  . Smoking status: Never Smoker  . Smokeless tobacco: Never Used  Substance and Sexual Activity  . Alcohol use: No  . Drug use: No  . Sexual activity: Never    Comment: HUSBAND DIED 2004-04-14  Lifestyle  . Physical activity:    Days per week: Not on file    Minutes per session: Not on file  . Stress: Not on file  Relationships  . Social connections:    Talks on phone: Not on file    Gets together: Not on file    Attends religious service: Not on file    Active member of club or organization: Not on file    Attends meetings of clubs or organizations: Not on file    Relationship status: Not on file  Other Topics Concern  . Not on file  Social History Narrative  . Not on file     Family History: The patient's family history includes AAA (abdominal aortic aneurysm) in her other; Aortic dissection in her sister; Bronchiolitis in her mother; Cancer - Lung in her son; Cancer - Prostate in her son; Fibromyalgia in her daughter; Healthy in her grandchild; Heart attack in her father; Hypertension in her mother; Other in her other; Pneumonia in her mother. ROS:   Please see the history of present illness.     All other systems reviewed and are negative.  EKGs/Labs/Other Studies Reviewed:    The following studies were reviewed today:     Recent Labs: 08/17/2017: BUN 14; Creatinine, Ser 0.76; Potassium 4.6; Sodium 134  Recent Lipid Panel No results found for: CHOL, TRIG, HDL, CHOLHDL, VLDL, LDLCALC, LDLDIRECT  Physical Exam:    Physical Exam: Blood pressure (!) 162/88, pulse 77, height '5\' 3"'$  (1.6 m), weight 129 lb 12.8 oz (58.9 kg), SpO2 98 %.  GEN:   Elderly female, no acute distress HEENT: Normal NECK: No JVD; No carotid bruits LYMPHATICS: No  lymphadenopathy CARDIAC:  LV heave ( especially after a deep breath )  Soft systolic murmur  RESPIRATORY:  Clear to auscultation without rales, wheezing or rhonchi  ABDOMEN: Soft, non-tender, non-distended MUSCULOSKELETAL:  No edema; No deformity  SKIN: Warm and dry NEUROLOGIC:  Alert and oriented x 3  EKG:    November 17, 2017: Normal sinus rhythm at 77.  Occasional premature atrial contractions.   ASSESSMENT:    1. Ascending aortic aneurysm ([place])  2. Essential hypertension   3. Aortic valve insufficiency, etiology of cardiac valve disease unspecified    PLAN:    In order of problems listed above:  1. Chronic diastolic congestive heart failure:    Not having any signs or symptoms of congestive heart failure.  Continue current medications. ( except for DC of Dilt and starting Coreg as noted below )   2.  Ascending aortic aneurysm:    She has a 4.6 cm ascending aortic aneurysm.  She has met with Dr. Servando Snare.  She has decided not to have surgery. Otila Kluver with medical therapy.  We will discontinue the diltiazem and start her on carvedilol 12.5 mg twice a day.  I have given her permission to actually start with 6.25 mg twice a day for the first 3 or 4 days. Her again in 3 months for follow-up.  She will continue to monitor her blood pressure.  B.  Fatigue/back pain:    2.  Aortic insufficiency:    Stable..  3.  HTN:   DC Dilt, start coreg .   Medication Adjustments/Labs and Tests Ordered: Current medicines are reviewed at length with the patient today.  Concerns regarding medicines are outlined above.  Orders Placed This Encounter  Procedures  . EKG 12-Lead   Meds ordered this encounter  Medications  . carvedilol (COREG) 12.5 MG tablet    Sig: Take 1 tablet (12.5 mg total) by mouth 2 (two) times daily.    Dispense:  180 tablet    Refill:  3    . Signed, Mertie Moores, MD  11/17/2017 8:42 AM    [place]

## 2017-12-10 ENCOUNTER — Ambulatory Visit (INDEPENDENT_AMBULATORY_CARE_PROVIDER_SITE_OTHER): Payer: Medicare PPO | Admitting: Orthopaedic Surgery

## 2017-12-10 ENCOUNTER — Ambulatory Visit (INDEPENDENT_AMBULATORY_CARE_PROVIDER_SITE_OTHER): Payer: Medicare PPO

## 2017-12-10 ENCOUNTER — Encounter (INDEPENDENT_AMBULATORY_CARE_PROVIDER_SITE_OTHER): Payer: Self-pay | Admitting: Orthopaedic Surgery

## 2017-12-10 DIAGNOSIS — M25512 Pain in left shoulder: Secondary | ICD-10-CM | POA: Insufficient documentation

## 2017-12-10 DIAGNOSIS — M25552 Pain in left hip: Secondary | ICD-10-CM | POA: Diagnosis not present

## 2017-12-10 DIAGNOSIS — M419 Scoliosis, unspecified: Secondary | ICD-10-CM

## 2017-12-10 DIAGNOSIS — G8929 Other chronic pain: Secondary | ICD-10-CM | POA: Diagnosis not present

## 2017-12-10 DIAGNOSIS — M25511 Pain in right shoulder: Secondary | ICD-10-CM | POA: Diagnosis not present

## 2017-12-10 DIAGNOSIS — M25551 Pain in right hip: Secondary | ICD-10-CM | POA: Insufficient documentation

## 2017-12-10 NOTE — Progress Notes (Signed)
Subjective: She is here with left groin pain.  Moderate osteoarthritis on x-ray, as well as severe thoracolumbar scoliosis.  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  Objective: She has pain with passive hip flexion and internal rotation.  Procedure: Ultrasound-guided left hip injection: After sterile prep with Betadine, injected 5 cc 1% lidocaine without epinephrine and 40 mg methylprednisolone using a 22-gauge spinal needle passing the needle through the iliofemoral ligament into the femoral head/neck junction.  Injectate was seen filling the joint capsule.  She had good pain relief during the immediate anesthetic phase.

## 2017-12-10 NOTE — Progress Notes (Signed)
Office Visit Note   Patient: Donna Callahan           Date of Birth: 1930/12/28           MRN: [id number] Visit Date: 12/10/2017              Requested by: Rodrigo RanPerini, Mark, MD [address] [place], [place] [id number] PCP: Rodrigo RanPerini, Mark, MD   Assessment & Plan: Visit Diagnoses:  1. Left hip pain   2. Chronic right shoulder pain   3. Scoliosis, unspecified scoliosis type, unspecified spinal region     Plan: Impression is #1 severe scoliosis with significant degenerative disc disease.  #2 right shoulder subacromial bursitis.  #3 left hip osteoarthritis.  In regards to the left lower extremity pain, we will refer her to Dr. Prince RomeHilts for an intra-articular cortisone injection to the left hip joint.  I will also send her to formal physical therapy.  In regards to the right shoulder, I did discuss subacromial cortisone injection, but the patient would like to wait until her pain worsens.  She will follow-up with us as needed.  Follow-Up Instructions: Return if symptoms worsen or fail to improve.   Orders:  Orders Placed This Encounter  Procedures  . XR Shoulder Right  . XR SCOLIOSIS EVAL COMPLETE SPINE 2 OR 3 VIEWS  . XR HIP UNILAT W OR W/O PELVIS 2-3 VIEWS LEFT   No orders of the defined types were placed in this encounter.     Procedures: No procedures performed   Clinical Data: No additional findings.   Subjective: Chief Complaint  Patient presents with  . Right Shoulder - Pain  . Lower Back - Pain  . Left Hip - Pain    HPI patient is a pleasant 84 year old female who presents to our clinic today with right shoulder pain as well as left lower back and groin pain.  She has had intermittent shoulder pain for the past little while.  Pain appears to be worse when she is trying to put on her coat or if she sleeps on the right side.  Otherwise, no pain.  No radicular symptoms right upper extremity.  In regards to the left back pain, history of severe scoliosis.  The pain she has to  the left lower back which does occasionally radiate into the left groin area.  Pain appears to be worse with walking.  She also notes some weakness to the left lower extremity.  No numbness, tingling or burning.  Review of Systems as detailed in HPI.  All others reviewed and are negative.   Objective: Vital Signs: There were no vitals taken for this visit.  Physical Exam well-developed well-nourished female no acute distress.  Alert and oriented x3  Ortho Exam she has no spinous or paraspinous tenderness.  Negative straight leg raise.  No pain with logrolling her left hip although she does not have full internal range of motion.  Right shoulder exam is unremarkable.  She is neurovascularly intact distally.  Specialty Comments:  No specialty comments available.  Imaging: Xr Hip Unilat W Or W/o Pelvis 2-3 Views Left  Result Date: 12/10/2017 Moderate bilateral hip osteoarthritis right greater than left  Xr Scoliosis Eval Complete Spine 2 Or 3 Views  Result Date: 12/10/2017 Severe scoliosis with multilevel degenerative disc disease  Xr Shoulder Right  Result Date: 12/10/2017 Mild glenohumeral changes    PMFS History: Patient Active Problem List   Diagnosis Date Noted  . Left hip pain 12/10/2017  . Chronic  right shoulder pain 12/10/2017  . Scoliosis 12/10/2017  . Chronic diastolic CHF (congestive heart failure) (HCC) 12/07/2016  . Varicose veins of left lower extremity with complications 11/25/2015  . Lower GI bleeding 07/26/2014  . Rectal bleeding 07/23/2014  . Hypertension 07/23/2014  . Acute blood loss anemia 07/23/2014  . Hyperlipidemia 07/23/2014  . Rectal bleed 07/23/2014  . OA (osteoarthritis) of knee 05/02/2014  . Varicose veins of right lower extremity with pain    Past Medical History:  Diagnosis Date  . CKD (chronic kidney disease) stage 2, GFR 60-89 ml/min   . Hard of hearing   . High cholesterol   . Hypertension   . Iron deficiency anemia   . Varicose  veins     Family History  Problem Relation Age of Onset  . Hypertension Mother   . Bronchiolitis Mother   . Pneumonia Mother   . Heart attack Father   . Aortic dissection Sister   . AAA (abdominal aortic aneurysm) Other        3 OTHER SIBLINGS DIED FOR THIS  . Other Other        MOTOR VEHICLE ACC  . Cancer - Lung Son        11/2011  . Cancer - Prostate Son        01/17/2013  . Fibromyalgia Daughter   . Healthy Grandchild        GREAT GRAND CHILDREN    Past Surgical History:  Procedure Laterality Date  . stab phlebectomy Right 04/27/2016   stab phlebectomy right leg by Josephina Gip MD   . stab phlebectomy  Left 05/11/2016   stab phlebectomy > 20 incisions left leg by Josephina Gip MD   . STAPEDECTOMY     bil ears  . TONSILLECTOMY    . TOTAL KNEE ARTHROPLASTY Right 05/02/2014   Procedure: RIGHT TOTAL KNEE ARTHROPLASTY;  Surgeon: Ollen Gross, MD;  Location: [facility];  Service: Orthopedics;  Laterality: Right;  Marland Kitchen VARICOSE VEIN SURGERY     Social History   Occupational History  . Not on file  Tobacco Use  . Smoking status: Never Smoker  . Smokeless tobacco: Never Used  Substance and Sexual Activity  . Alcohol use: No  . Drug use: No  . Sexual activity: Never    Comment: HUSBAND DIED Jun 18, 2004

## 2018-01-26 DIAGNOSIS — I10 Essential (primary) hypertension: Secondary | ICD-10-CM | POA: Diagnosis not present

## 2018-01-28 ENCOUNTER — Other Ambulatory Visit: Payer: Self-pay | Admitting: *Deleted

## 2018-01-28 DIAGNOSIS — M40209 Unspecified kyphosis, site unspecified: Secondary | ICD-10-CM | POA: Diagnosis not present

## 2018-01-28 DIAGNOSIS — I712 Thoracic aortic aneurysm, without rupture, unspecified: Secondary | ICD-10-CM

## 2018-01-28 DIAGNOSIS — M419 Scoliosis, unspecified: Secondary | ICD-10-CM | POA: Diagnosis not present

## 2018-01-28 DIAGNOSIS — R531 Weakness: Secondary | ICD-10-CM | POA: Diagnosis not present

## 2018-01-31 ENCOUNTER — Telehealth (INDEPENDENT_AMBULATORY_CARE_PROVIDER_SITE_OTHER): Payer: Self-pay | Admitting: Orthopaedic Surgery

## 2018-01-31 NOTE — Telephone Encounter (Signed)
12/10/17 xray reports faxed to [facility] [phone]

## 2018-02-01 DIAGNOSIS — R531 Weakness: Secondary | ICD-10-CM | POA: Diagnosis not present

## 2018-02-01 DIAGNOSIS — M419 Scoliosis, unspecified: Secondary | ICD-10-CM | POA: Diagnosis not present

## 2018-02-01 DIAGNOSIS — M40209 Unspecified kyphosis, site unspecified: Secondary | ICD-10-CM | POA: Diagnosis not present

## 2018-02-03 DIAGNOSIS — M419 Scoliosis, unspecified: Secondary | ICD-10-CM | POA: Diagnosis not present

## 2018-02-03 DIAGNOSIS — R531 Weakness: Secondary | ICD-10-CM | POA: Diagnosis not present

## 2018-02-03 DIAGNOSIS — M40209 Unspecified kyphosis, site unspecified: Secondary | ICD-10-CM | POA: Diagnosis not present

## 2018-02-08 DIAGNOSIS — M419 Scoliosis, unspecified: Secondary | ICD-10-CM | POA: Diagnosis not present

## 2018-02-08 DIAGNOSIS — M40209 Unspecified kyphosis, site unspecified: Secondary | ICD-10-CM | POA: Diagnosis not present

## 2018-02-08 DIAGNOSIS — R531 Weakness: Secondary | ICD-10-CM | POA: Diagnosis not present

## 2018-02-10 DIAGNOSIS — R531 Weakness: Secondary | ICD-10-CM | POA: Diagnosis not present

## 2018-02-10 DIAGNOSIS — M419 Scoliosis, unspecified: Secondary | ICD-10-CM | POA: Diagnosis not present

## 2018-02-10 DIAGNOSIS — M40209 Unspecified kyphosis, site unspecified: Secondary | ICD-10-CM | POA: Diagnosis not present

## 2018-02-15 DIAGNOSIS — R531 Weakness: Secondary | ICD-10-CM | POA: Diagnosis not present

## 2018-02-15 DIAGNOSIS — M419 Scoliosis, unspecified: Secondary | ICD-10-CM | POA: Diagnosis not present

## 2018-02-15 DIAGNOSIS — M40209 Unspecified kyphosis, site unspecified: Secondary | ICD-10-CM | POA: Diagnosis not present

## 2018-02-17 ENCOUNTER — Encounter: Payer: Self-pay | Admitting: Cardiovascular Disease

## 2018-02-17 ENCOUNTER — Encounter: Payer: Self-pay | Admitting: Nurse Practitioner

## 2018-02-17 ENCOUNTER — Ambulatory Visit (INDEPENDENT_AMBULATORY_CARE_PROVIDER_SITE_OTHER): Payer: Medicare PPO | Admitting: Cardiovascular Disease

## 2018-02-17 VITALS — BP 170/92 | HR 58 | Ht 63.0 in | Wt 135.8 lb

## 2018-02-17 DIAGNOSIS — I10 Essential (primary) hypertension: Secondary | ICD-10-CM

## 2018-02-17 DIAGNOSIS — I5032 Chronic diastolic (congestive) heart failure: Secondary | ICD-10-CM | POA: Diagnosis not present

## 2018-02-17 MED ORDER — AMLODIPINE BESYLATE 2.5 MG PO TABS: 2.5000 mg | ORAL_TABLET | Freq: Every day | ORAL | 11 refills | Status: DC

## 2018-02-17 NOTE — Progress Notes (Signed)
Cardiology Office Note:    Date:  02/17/2018   ID:  Donna Callahan, DOB [birth year], MRN [id number]  PCP:  Rodrigo Ran, MD  Cardiologist:  Kristeen Miss, MD    Referring MD: Rodrigo Ran, MD   Chief Complaint  Patient presents with  . Congestive Heart Failure    Donna Callahan is a 85 y.o. female with a hx of hypertension, hyperlipidemia, chronic kidney disease stage II, who was seen by Chelsea Aus, PA and me in Sept. For DOE  BP was elevated at that time.    Having lots of GI issues.   Breathing is better.  Gives out when she walks any distance.    Thinks is due to back pain  Is watching her diet   Echo shows normal systolic function with grade 1 diastolic dysfunction.  She has moderate aortic insufficiency with a moderately dilated aorta.  August 17, 2017:  Having some back pain  Unable to walk - has lots of fatigue .   Leg weakness.  No cp or chest tightness or fullness.  Has mod. - severe dilitation of her ascending aorta ( 4.8 cm)  She reports having a brother and sister die of ruptured aortic aneurism   November 17, 2017: Donna Callahan is seen today.  She has a history of diastolic congestive heart failure.  She has a ascending aortic aneurysm.  She has seen Dr. Tyrone Sage.  She is not interested in any surgical intervention.  BP is a bit high today  Advised her to avoid salty foods   February 17, 2018: Donna Callahan is seen today for follow-up of her hypertension, chronic diastolic congestive heart failure, and  thoracic aortic aneurysm. BP is a little elevated today.  Her blood pressure readings from home indicate an occasional high blood pressure but also several normal/low blood pressure readings.  Her blood pressure ranges anywhere from 116 systolic up to 160 systolic on her home readings.  She tries to avoid eating any extra salt.  Past Medical History:  Diagnosis Date  . CKD (chronic kidney disease) stage 2, GFR 60-89 ml/min   . Hard of hearing   . High cholesterol   .  Hypertension   . Iron deficiency anemia   . Varicose veins     Past Surgical History:  Procedure Laterality Date  . stab phlebectomy Right 04/27/2016   stab phlebectomy right leg by Josephina Gip MD   . stab phlebectomy  Left 05/11/2016   stab phlebectomy > 20 incisions left leg by Josephina Gip MD   . STAPEDECTOMY     bil ears  . TONSILLECTOMY    . TOTAL KNEE ARTHROPLASTY Right 05/02/2014   Procedure: RIGHT TOTAL KNEE ARTHROPLASTY;  Surgeon: Ollen Gross, MD;  Location: [facility];  Service: Orthopedics;  Laterality: Right;  Marland Kitchen VARICOSE VEIN SURGERY      Current Medications: Current Meds  Medication Sig  . acetaminophen (TYLENOL) 500 MG tablet Take 500-1,000 mg by mouth as needed for moderate pain.   Marland Kitchen BIOTIN PO Take by mouth.  . Calcium-Magnesium-Vitamin D (CALCIUM 1200+D3 PO) Take 1 tablet by mouth 2 (two) times daily.  . carvedilol (COREG) 12.5 MG tablet Take 1 tablet (12.5 mg total) by mouth 2 (two) times daily.  Marland Kitchen doxazosin (CARDURA) 2 MG tablet Take 2 mg by mouth daily.   Marland Kitchen estradiol (ESTRACE VAGINAL) 0.1 MG/GM vaginal cream Place 1 Applicatorful vaginally 3 (three) times a week.  . loperamide (IMODIUM) 2 MG capsule Take by mouth as needed  for diarrhea or loose stools.  . Multiple Vitamins-Minerals (CENTRUM SILVER PO) Take 1 tablet by mouth daily.  Marland Kitchen. olmesartan (BENICAR) 40 MG tablet TAKE 1 TABLET BY MOUTH EVERY DAY **TO REPLACE IRBESARTAN**  . Polyethyl Glycol-Propyl Glycol (SYSTANE OP) Apply 1 drop to eye every morning.  . potassium chloride SA (KLOR-CON M20) 20 MEQ tablet TAKE 3 TABLETS BY MOUTH EVERY DAY  . psyllium (METAMUCIL) 58.6 % powder Take 1 packet by mouth at bedtime as needed (for constipation).   . simethicone (MYLICON) 80 MG chewable tablet Chew 1 tablet by mouth daily.  . sodium chloride (OCEAN) 0.65 % SOLN nasal spray Place 1-2 sprays into both nostrils every morning.  . triamterene-hydrochlorothiazide (MAXZIDE-25) 37.5-25 MG tablet TAKE 1/2  TABLET BY MOUTH ONCE  DAILY     Allergies:   Benadryl [diphenhydramine hcl (sleep)]; Diphenhydramine hcl; Penicillins; Quinolones; Sulfacetamide sodium; Penicillin g; and Sulfa antibiotics   Social History   Socioeconomic History  . Marital status: Widowed    Spouse name: Not on file  . Number of children: Not on file  . Years of education: Not on file  . Highest education level: Not on file  Occupational History  . Not on file  Social Needs  . Financial resource strain: Not on file  . Food insecurity:    Worry: Not on file    Inability: Not on file  . Transportation needs:    Medical: Not on file    Non-medical: Not on file  Tobacco Use  . Smoking status: Never Smoker  . Smokeless tobacco: Never Used  Substance and Sexual Activity  . Alcohol use: No  . Drug use: No  . Sexual activity: Never    Comment: HUSBAND DIED 2006  Lifestyle  . Physical activity:    Days per week: Not on file    Minutes per session: Not on file  . Stress: Not on file  Relationships  . Social connections:    Talks on phone: Not on file    Gets together: Not on file    Attends religious service: Not on file    Active member of club or organization: Not on file    Attends meetings of clubs or organizations: Not on file    Relationship status: Not on file  Other Topics Concern  . Not on file  Social History Narrative  . Not on file     Family History: The patient's family history includes AAA (abdominal aortic aneurysm) in an other family member; Aortic dissection in her sister; Bronchiolitis in her mother; Cancer - Lung in her son; Cancer - Prostate in her son; Fibromyalgia in her daughter; Healthy in her grandchild; Heart attack in her father; Hypertension in her mother; Other in an other family member; Pneumonia in her mother. ROS:   Please see the history of present illness.     All other systems reviewed and are negative.  EKGs/Labs/Other Studies Reviewed:    The following studies were reviewed  today:     Recent Labs: 08/17/2017: BUN 14; Creatinine, Ser 0.76; Potassium 4.6; Sodium 134  Recent Lipid Panel No results found for: CHOL, TRIG, HDL, CHOLHDL, VLDL, LDLCALC, LDLDIRECT  Physical Exam:    Physical Exam: Blood pressure (!) 170/92, pulse (!) 58, height 5\' 3"  (1.6 m), weight 135 lb 12.8 oz (61.6 kg), SpO2 97 %.  GEN:    Elderly female,  NAD  HEENT: Normal NECK: No JVD; No carotid bruits LYMPHATICS: No lymphadenopathy CARDIAC: RRR,   Soft diastolic  murmur  RESPIRATORY:  Clear to auscultation without rales, wheezing or rhonchi  ABDOMEN: Soft, non-tender, non-distended MUSCULOSKELETAL:  No edema; No deformity  SKIN: Warm and dry NEUROLOGIC:  Alert and oriented x 3   EKG:        ASSESSMENT:    No diagnosis found. PLAN:    In order of problems listed above:  1. Chronic diastolic congestive heart failure:     2. She is breathing well.  She does not add salt to her diet.  2.  Ascending aortic aneurysm:     Followed by Dr. Tyrone SageGerhardt.       Fatigue/back pain:    2.  Aortic insufficiency:     Stable    3.  HTN:    Will add Amlodipine 2. 5 mg a day , She will return for a nurse visit in 3 weeks.  Medication Adjustments/Labs and Tests Ordered: Current medicines are reviewed at length with the patient today.  Concerns regarding medicines are outlined above.  No orders of the defined types were placed in this encounter.  Meds ordered this encounter  Medications  . amLODipine (NORVASC) 2.5 MG tablet    Sig: Take 1 tablet (2.5 mg total) by mouth daily.    Dispense:  30 tablet    Refill:  11      . Signed, Kristeen MissPhilip Nahser, MD  02/17/2018 5:55 PM    [facility]

## 2018-02-17 NOTE — Patient Instructions (Addendum)
Medication Instructions:  Your physician has recommended you make the following change in your medication:  START Amlodipine (Norvasc) 2.5 mg once daily  If you need a refill on your cardiac medications before your next appointment, please call your pharmacy.   Lab work: None Ordered    Testing/Procedures: None Ordered   Follow-Up: Your physician recommends that you return for a follow-up appointment on Friday Feb. 21 at 1:00 pm with Nurse Marcelino Duster for BP check   At [facility], you and your health needs are our priority.  As part of our continuing mission to provide you with exceptional heart care, we have created designated Provider Care Teams.  These Care Teams include your primary Cardiologist (physician) and Advanced Practice Providers (APPs -  Physician Assistants and Nurse Practitioners) who all work together to provide you with the care you need, when you need it. You will need a follow up appointment in:  6 months.  Please call our office 2 months in advance to schedule this appointment.  You may see Kristeen Miss, MD or one of the following Advanced Practice Providers on your designated Care Team: Tereso Newcomer, PA-C [place], [place] . Berton Bon, NP

## 2018-02-18 DIAGNOSIS — M40209 Unspecified kyphosis, site unspecified: Secondary | ICD-10-CM | POA: Diagnosis not present

## 2018-02-18 DIAGNOSIS — M419 Scoliosis, unspecified: Secondary | ICD-10-CM | POA: Diagnosis not present

## 2018-02-18 DIAGNOSIS — R531 Weakness: Secondary | ICD-10-CM | POA: Diagnosis not present

## 2018-02-24 DIAGNOSIS — R531 Weakness: Secondary | ICD-10-CM | POA: Diagnosis not present

## 2018-02-24 DIAGNOSIS — M40209 Unspecified kyphosis, site unspecified: Secondary | ICD-10-CM | POA: Diagnosis not present

## 2018-02-24 DIAGNOSIS — M419 Scoliosis, unspecified: Secondary | ICD-10-CM | POA: Diagnosis not present

## 2018-03-01 DIAGNOSIS — M419 Scoliosis, unspecified: Secondary | ICD-10-CM | POA: Diagnosis not present

## 2018-03-01 DIAGNOSIS — R531 Weakness: Secondary | ICD-10-CM | POA: Diagnosis not present

## 2018-03-01 DIAGNOSIS — M40209 Unspecified kyphosis, site unspecified: Secondary | ICD-10-CM | POA: Diagnosis not present

## 2018-03-08 DIAGNOSIS — R531 Weakness: Secondary | ICD-10-CM | POA: Diagnosis not present

## 2018-03-08 DIAGNOSIS — M40209 Unspecified kyphosis, site unspecified: Secondary | ICD-10-CM | POA: Diagnosis not present

## 2018-03-08 DIAGNOSIS — M419 Scoliosis, unspecified: Secondary | ICD-10-CM | POA: Diagnosis not present

## 2018-03-10 DIAGNOSIS — M40209 Unspecified kyphosis, site unspecified: Secondary | ICD-10-CM | POA: Diagnosis not present

## 2018-03-10 DIAGNOSIS — R531 Weakness: Secondary | ICD-10-CM | POA: Diagnosis not present

## 2018-03-10 DIAGNOSIS — M419 Scoliosis, unspecified: Secondary | ICD-10-CM | POA: Diagnosis not present

## 2018-03-11 ENCOUNTER — Ambulatory Visit: Payer: Medicare PPO

## 2018-03-15 ENCOUNTER — Ambulatory Visit (INDEPENDENT_AMBULATORY_CARE_PROVIDER_SITE_OTHER): Payer: Medicare PPO

## 2018-03-15 VITALS — BP 140/70 | HR 56 | Ht 63.0 in | Wt 136.0 lb

## 2018-03-15 DIAGNOSIS — I10 Essential (primary) hypertension: Secondary | ICD-10-CM

## 2018-03-15 MED ORDER — AMLODIPINE BESYLATE 5 MG PO TABS: 5.0000 mg | ORAL_TABLET | Freq: Every day | ORAL | 3 refills | Status: DC

## 2018-03-15 NOTE — Patient Instructions (Signed)
Medication Instructions:  Your physician has recommended you make the following change in your medication:  1-Increase Amlodipine 5 mg by mouth daily  If you need a refill on your cardiac medications before your next appointment, please call your pharmacy.   Lab work:  If you have labs (blood work) drawn today and your tests are completely normal, you will receive your results only by: Marland Kitchen MyChart Message (if you have MyChart) OR . A paper copy in the mail If you have any lab test that is abnormal or we need to change your treatment, we will call you to review the results.  Testing/Procedures: None ordered today.  Follow-Up: At [facility], you and your health needs are our priority.  As part of our continuing mission to provide you with exceptional heart care, we have created designated Provider Care Teams.  These Care Teams include your primary Cardiologist (physician) and Advanced Practice Providers (APPs -  Physician Assistants and Nurse Practitioners) who all work together to provide you with the care you need, when you need it. You will need a follow up appointment in:  6 months.  Please call our office 2 months in advance to schedule this appointment.  You may see Kristeen Miss, MD or one of the following Advanced Practice Providers on your designated Care Team: Tereso Newcomer, PA-C [place], [place] . Berton Bon, NP

## 2018-03-15 NOTE — Progress Notes (Signed)
1.) Reason for visit: BP check  2.) Name of MD requesting visit: Dr. Elease Hashimoto  3.) H&P: Patient has history of HTN, HLD, and CKD. Patient's BP was elevated at office visit on 02/17/18 at 170/92. Patient was started on amlodipine 2.5 mg daily and following up with a BP today.   4.) ROS related to problem: Patient's vital signs today, BP 140/70, HR 56, O2 Sat 98% on room air. Patient's weight is 136 lbs and height 5'3". Patient has no complains today. Patient has been keeping BP log, which SBP range between 120's to 150's.   5.) Assessment and plan per MD: Dr. Elease Hashimoto reviewed patient's BP log and vital signs. Dr. Elease Hashimoto recommend patient to increase her amlodipine to 5 mg daily, and to call our office if her SBP are running high. Patient will follow up in 6 months with Dr. Elease Hashimoto as directed at last office visit.

## 2018-03-16 DIAGNOSIS — R531 Weakness: Secondary | ICD-10-CM | POA: Diagnosis not present

## 2018-03-16 DIAGNOSIS — M419 Scoliosis, unspecified: Secondary | ICD-10-CM | POA: Diagnosis not present

## 2018-03-16 DIAGNOSIS — M40209 Unspecified kyphosis, site unspecified: Secondary | ICD-10-CM | POA: Diagnosis not present

## 2018-03-24 ENCOUNTER — Ambulatory Visit
Admission: RE | Admit: 2018-03-24 | Discharge: 2018-03-24 | Disposition: A | Discharge status: Home / Self Care | Payer: Medicare PPO | Source: Ambulatory Visit | Attending: Cardiothoracic Surgery | Admitting: Cardiothoracic Surgery

## 2018-03-24 ENCOUNTER — Encounter: Payer: Self-pay | Admitting: Cardiothoracic Surgery

## 2018-03-24 ENCOUNTER — Other Ambulatory Visit: Payer: Self-pay

## 2018-03-24 ENCOUNTER — Ambulatory Visit (INDEPENDENT_AMBULATORY_CARE_PROVIDER_SITE_OTHER): Payer: Medicare PPO | Admitting: Cardiothoracic Surgery

## 2018-03-24 VITALS — BP 108/70 | HR 63 | Resp 18 | Ht 63.0 in | Wt 135.6 lb

## 2018-03-24 DIAGNOSIS — I712 Thoracic aortic aneurysm, without rupture, unspecified: Secondary | ICD-10-CM

## 2018-03-24 DIAGNOSIS — I7121 Aneurysm of the ascending aorta, without rupture: Secondary | ICD-10-CM

## 2018-03-24 MED ORDER — IOPAMIDOL (ISOVUE-370) INJECTION 76%
75.0000 mL | Freq: Once | INTRAVENOUS | Status: AC | PRN
  Administered 2018-03-24: 75 mL via INTRAVENOUS

## 2018-03-24 NOTE — Progress Notes (Signed)
[address]       [place] [address]             [phone]                    Donna Callahan [facility] Medical Record #[id number] Date of Birth: Jun 19, 1930  Referring: Callahan, Donna Ping, MD Primary Care: Donna Ran, MD Primary Cardiologist: Donna Miss, MD  Chief Complaint:    Chief Complaint  Patient presents with  . Thoracic Aortic Aneurysm    6 month f/u with chest CT    History of Present Illness:    Donna Callahan 85 y.o. female is seen in the office in follow up   today for dilated ascending aorta.    The patient is followed by Dr. Elease Callahan.  Because of increasing shortness of breath and fatigue with exertion the patient was evaluated by cardiology.  Echocardiogram and a CTA was performed, patient was referred because of a 4.7 cm dilated ascending aorta.  She returns today with a follow-up CTA of the chest.   The patient has a positive family history of aneurysm disease her sister was followed with an ascending aorta dilatation and died of acute aortic dissection in 2008 at age 57. Patient's father died suddenly at age 47 in the 67s, she has one nephew currently followed in our office for a dilated ascending aorta.  She notes that since last seen she is been without significant symptoms of shortness of breath ,heart failure, and notes her blood pressures been better controlled after increasing her blood pressure medications.  [place] some light work around her house.   Current Activity/ Functional Status:  Patient is independent with mobility/ambulation, transfers, ADL's, IADL's.   Zubrod Score: At the time of surgery this patient's most appropriate activity status/level should be described as: []     0    Normal activity, no symptoms [x]     1    Restricted in physical strenuous activity but ambulatory, able to do out light work []     2    Ambulatory and capable of self care, unable to do work  activities, up and about               >50 % of waking hours                              []     3    Only limited self care, in bed greater than 50% of waking hours []     4    Completely disabled, no self care, confined to bed or chair []     5    Moribund   Past Medical History:  Diagnosis Date  . CKD (chronic kidney disease) stage 2, GFR 60-89 ml/min   . Hard of hearing   . High cholesterol   . Hypertension   . Iron deficiency anemia   . Varicose veins     Past Surgical History:  Procedure Laterality Date  . stab phlebectomy Right 04/27/2016   stab phlebectomy right leg by Donna Gip MD   . stab phlebectomy  Left 05/11/2016   stab phlebectomy > 20 incisions left leg by Donna Gip MD   . STAPEDECTOMY     bil ears  . TONSILLECTOMY    . TOTAL KNEE ARTHROPLASTY Right 05/02/2014   Procedure: RIGHT TOTAL  KNEE ARTHROPLASTY;  Surgeon: Ollen Gross, MD;  Location: [facility];  Service: Orthopedics;  Laterality: Right;  Marland Kitchen VARICOSE VEIN SURGERY      Family History  Problem Relation Age of Onset  . Hypertension Mother   . Bronchiolitis Mother   . Pneumonia Mother   . Heart attack Father   . Aortic dissection Sister   . AAA (abdominal aortic aneurysm) Other        3 OTHER SIBLINGS DIED FOR THIS  . Other Other        MOTOR VEHICLE ACC  . Cancer - Lung Son        11/2011  . Cancer - Prostate Son        01/17/2013  . Fibromyalgia Daughter   . Healthy Grandchild        GREAT GRAND CHILDREN   Patient's nephew Donna Callahan is followed for dilated a sending aorta and has had previous coronary artery bypass grafting  Patient's sister Donna Callahan was followed with a dilated ascending aorta by Dr. Virgel Callahan and died at age 26 with a acute aortic dissection.  Her father died at age 79 of sudden death in 28.   Social History   Tobacco Use  Smoking Status Never Smoker  Smokeless Tobacco Never Used    Social History   Substance and Sexual Activity  Alcohol Use No      Allergies  Allergen Reactions  . Donna Callahan [Donna Callahan (Sleep)] Other (See Comments)    Paradoxical response, hyperactivity  . Donna Callahan Other (See Comments)    Paradoxical response, hyperactivity  . Donna Callahan Hives  . Donna Callahan     Patient was warned about not using Cipro and similar antibiotics. Recent studies have raised concern that fluoroquinolone antibiotics could be associated with an increased risk of aortic aneurysm Fluoroquinolones have non-antimicrobial properties that might jeopardise the integrity of the extracellular matrix of the vascular wall In a  propensity score matched cohort study in [place], there was a 66% increased rate of aortic aneurysm or dissection associated with oral fluoroquinolone use, compared wit  . Sulfacetamide Sodium Nausea And Vomiting  . Penicillin G Rash  . Sulfa Antibiotics Nausea And Vomiting and Rash    Current Outpatient Medications  Medication Sig Dispense Refill  . acetaminophen (TYLENOL) 500 MG tablet Take 500-1,000 mg by mouth as needed for moderate pain.     Marland Kitchen amLODipine (NORVASC) 5 MG tablet Take 1 tablet (5 mg total) by mouth daily. 90 tablet 3  . BIOTIN PO Take by mouth.    . Calcium-Magnesium-Vitamin D (CALCIUM 1200+D3 PO) Take 1 tablet by mouth 2 (two) times daily.    . carvedilol (COREG) 12.5 MG tablet Take 1 tablet (12.5 mg total) by mouth 2 (two) times daily. 180 tablet 3  . doxazosin (CARDURA) 2 MG tablet Take 2 mg by mouth daily.     Marland Kitchen estradiol (ESTRACE VAGINAL) 0.1 MG/GM vaginal cream Place 1 Applicatorful vaginally 3 (three) times a week.    . loperamide (IMODIUM) 2 MG capsule Take by mouth as needed for diarrhea or loose stools.    . Multiple Vitamins-Minerals (CENTRUM SILVER PO) Take 1 tablet by mouth daily.    Marland Kitchen olmesartan (BENICAR) 40 MG tablet TAKE 1 TABLET BY MOUTH EVERY DAY **TO REPLACE IRBESARTAN**  3  . Polyethyl Glycol-Propyl Glycol (SYSTANE OP) Apply 1 drop to eye every morning.    .  potassium chloride SA (KLOR-CON M20) 20 MEQ tablet TAKE 3 TABLETS BY MOUTH EVERY DAY    .  psyllium (METAMUCIL) 58.6 % powder Take 1 packet by mouth at bedtime as needed (for constipation).     . simethicone (MYLICON) 80 MG chewable tablet Chew 1 tablet by mouth daily.    . sodium chloride (OCEAN) 0.65 % SOLN nasal spray Place 1-2 sprays into both nostrils every morning.    . triamterene-hydrochlorothiazide (MAXZIDE-25) 37.5-25 MG tablet TAKE 1/2  TABLET BY MOUTH ONCE DAILY     No current facility-administered medications for this visit.     Pertinent items are noted in HPI.   Review of Systems:     Cardiac Review of Systems: [Y] = yes  or   [ N ] = no   Chest Pain [ N  ]  Resting SOB [  N] Exertional SOB  [  Y]  Orthopnea Klaus.Mock ]   Pedal Edema [ N ]    Palpitations [N] Syncope  Klaus.Mock  ]   Presyncope [ N  ]   General Review of Systems: [Y] = yes [  ]=no Constitional: recent weight change [  ];  Wt loss over the last 3 months [   ] anorexia [  ]; fatigue [  ]; nausea [  ]; night sweats [  ]; fever [  ]; or chills [  ];           Eye : blurred vision [  ]; diplopia [   ]; vision changes [  ];  Amaurosis fugax[  ]; Resp: cough [  ];  wheezing[  ];  hemoptysis[  ]; shortness of breath[  ]; paroxysmal nocturnal dyspnea[  ]; dyspnea on exertion[  ]; or orthopnea[  ];  GI:  gallstones[  ], vomiting[  ];  dysphagia[  ]; melena[  ];  hematochezia [  ]; heartburn[  ];   Hx of  Colonoscopy[  ]; GU: kidney stones [  ]; hematuria[  ];   dysuria [ y ];  nocturia[  ];  history of     obstruction [  ]; urinary frequency [  ]             Skin: rash, swelling[  ];, hair loss[  ];  peripheral edema[  ];  or itching[  ]; Musculosketetal: myalgias[  ];  joint swelling[  ];  joint erythema[  ];  joint pain[  ];  back pain[  ];  Heme/Lymph: bruising[  ];  bleeding[  ];  anemia[  ];  Neuro: TIA[  ];  headaches[  ];  stroke[  ];  vertigo[  ];  seizures[  ];   paresthesias[  ];  difficulty walking[ y ];  Psych:depression[   ]; anxiety[  ];  Endocrine: diabetes[  ];  thyroid dysfunction[  ];  Immunizations: Flu up to date [ y ]; Pneumococcal up to date Cove.Etienne  ];  Other:    PHYSICAL EXAMINATION: BP 108/70 (BP Location: Left Arm, Patient Position: Sitting, Cuff Size: Normal)   Pulse 63   Resp 18   Ht 5\' 3"  (1.6 m)   Wt 135 lb 9.6 oz (61.5 kg)   SpO2 94% Comment: RA  BMI 24.02 kg/m  General appearance: alert, cooperative and no distress Head: Normocephalic, without obvious abnormality, atraumatic Neck: no adenopathy, no carotid bruit, no JVD, supple, symmetrical, trachea midline and thyroid not enlarged, symmetric, no tenderness/mass/nodules Lymph nodes: Cervical, supraclavicular, and axillary nodes normal. Resp: clear to auscultation bilaterally Back: symmetric, no curvature. ROM normal. No CVA tenderness. Cardio: regular rate and rhythm, S1, S2  normal, no murmur, click, rub or gallop GI: soft, non-tender; bowel sounds normal; no masses,  no organomegaly Extremities: extremities normal, atraumatic, no cyanosis or edema and Homans sign is negative, no sign of DVT Neurologic: Grossly normal No palpable abdominal or popliteal aneurysms bilaterally Palpable DP PT pulses bilaterally  Diagnostic Studies & Laboratory data:     Recent Radiology Findings:   Ct Angio Chest Aorta W &/or Wo Contrast  Result Date: 03/24/2018 CLINICAL DATA:  Follow-up thoracic aortic aneurysm EXAM: CT ANGIOGRAPHY CHEST WITH CONTRAST TECHNIQUE: Multidetector CT imaging of the chest was performed using the standard protocol during bolus administration of intravenous contrast. Multiplanar CT image reconstructions and MIPs were obtained to evaluate the vascular anatomy. CONTRAST:  75mL ISOVUE-370 IOPAMIDOL (ISOVUE-370) INJECTION 76% Creatinine was obtained on site at [facility] at [address] Results: Creatinine 0.8 mg/dL. COMPARISON:  08/27/2017 FINDINGS: Cardiovascular: Thoracic aorta again demonstrates atherosclerotic  calcifications without evidence of dissection. There is fusiform dilatation of the ascending aorta measuring 4.8 cm in greatest dimension on the axial images at the level of the pulmonary artery. This is stable in appearance when compared with the prior exam. Normal tapering in the thoracic aortic arch is noted. No aneurysmal dilatation in the descending thoracic aorta is noted. The origins of the brachiocephalic vessels are within normal limits. Mild coronary calcifications are seen. The heart is at the upper limits of normal in size. No central pulmonary embolus is seen. The venous drainage from the left occurs via a hypertrophied superior intercostal vein stable from the previous exam. Mediastinum/Nodes: Scattered hypodensities are again identified in the thyroid with a focal calcification on the left. No sizable hilar or mediastinal adenopathy is noted. The esophagus as visualized is within normal limits. Lungs/Pleura: Lungs are well aerated bilaterally. Mild scarring is noted in the left lower lobe. Mild lingular scarring is noted as well. Stable from the prior exam. No focal infiltrate or sizable effusion is noted. No nodular changes are seen. Upper Abdomen: Visualized upper abdomen shows no acute abnormality. Musculoskeletal: Degenerative changes of the thoracic spine are noted. No acute bony abnormality is seen. Review of the MIP images confirms the above findings. IMPRESSION: Stable ascending thoracic aortic aneurysm. Recommend semi-annual imaging followup by CTA or MRA and referral to cardiothoracic surgery if not already obtained. This recommendation follows 2010 ACCF/AHA/AATS/ACR/ASA/SCA/SCAI/SIR/STS/SVM Guidelines for the Diagnosis and Management of Patients With Thoracic Aortic Disease. Circulation. 2010; 121: Z610-R604. Aortic aneurysm NOS (ICD10-I71.9) Aortic Atherosclerosis (ICD10-I70.0). Electronically Signed   By: Alcide Clever M.D.   On: 03/24/2018 10:07    Ct Angio Chest Aorta W &/or Wo  Contrast  Result Date: 08/27/2017 CLINICAL DATA:  Follow-up thoracic aortic aneurysm. EXAM: CT ANGIOGRAPHY CHEST WITH CONTRAST TECHNIQUE: Multidetector CT imaging of the chest was performed using the standard protocol during bolus administration of intravenous contrast. Multiplanar CT image reconstructions and MIPs were obtained to evaluate the vascular anatomy. CONTRAST:  ISOVUE-370 IOPAMIDOL (ISOVUE-370) INJECTION 76% COMPARISON:  CT abdomen pelvis-07/23/2014 FINDINGS: Vascular Findings: Fusiform aneurysmal dilatation of the ascending thoracic aorta was measurements as follows. The thoracic aorta tapers to a normal caliber at the level of the aortic arch. The descending thoracic aorta is mildly tortuous but of normal caliber. No evidence of thoracic aortic dissection or periaortic stranding on this nongated examination. Conventional configuration of the aortic arch. The branch vessels of the aortic arch appear widely patent throughout their imaged course. Cardiomegaly. Coronary artery calcifications. Small amount of fluid is seen within the pericardial recess.  No pericardial effusion. Although this examination was not tailored for the evaluation the pulmonary arteries, there are no discrete filling defects within the central pulmonary arterial tree to suggest central pulmonary embolism. Normal caliber the main pulmonary artery. 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 ------------------------------------------------------------- Thoracic aortic measurements: Sinotubular junction 41 mm as measured in greatest oblique coronal dimension. Proximal ascending aorta 48 mm as measured in greatest oblique axial dimension at the level of the main pulmonary artery  (image 51, series 4) and approximately 50 mm in greatest oblique short axis coronal diameter (coronal image 34, series 7). Aortic arch aorta 29 mm as measured in greatest oblique sagittal dimension. Proximal descending thoracic aorta 27 mm as measured in greatest oblique axial dimension at the level of the main pulmonary artery. Distal descending thoracic aorta 27 mm as measured in greatest oblique axial dimension at the level of the diaphragmatic hiatus. Review of the MIP images confirms the above findings. ------------------------------------------------------------- Non-Vascular Findings: Mediastinum/Lymph Nodes: No bulky mediastinal, hilar axillary lymphadenopathy. Lungs/Pleura: Minimal dependent subpleural ground-glass atelectasis. Minimal subsegmental atelectasis within the left lower lobe. No discrete focal airspace opacities. No pleural effusion or pneumothorax. No discrete pulmonary nodules. The central pulmonary airways appear widely patent. Upper abdomen: Limited early arterial phase evaluation of the upper abdomen demonstrates enlargement of the caliber of the central aspect of the pancreatic duct measuring approximately 5 mm in diameter (image 109, series 4, similar to remote abdominal CT performed 07/2014. Musculoskeletal: No acute or aggressive osseous abnormalities. Moderate rotatory scoliotic curvature of the thoracolumbar spine with dominant mid component convex to the left with associated moderate severe DDD within the central component of the concavity. Regional soft tissues appear normal. Mildly heterogeneous appearing thyroid gland. Note is made of an approximately 0.5 cm dystrophic calcification within the left lobe of the thyroid. IMPRESSION: 1. Uncomplicated fusiform aneurysmal dilatation of the ascending thoracic aorta measuring 50 mm in diameter. Aortic aneurysm NOS (ICD10-I71.9). 2. Coronary artery calcifications. Aortic Atherosclerosis (ICD10-I70.0). 3. 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 Electronically Signed   By: Simonne Come M.D.   On: 08/27/2017 10:26     I have independently reviewed the above radiology studies  and reviewed the findings with the patient.   ECHO 08/23/2017: Transthoracic Echocardiography  Patient:    Donna Callahan, Donna Callahan MR #:       [id number] Study Date: 08/23/2017 Gender:     F Age:        87 Height:     160 cm Weight:     60.2 kg BSA:        1.64 m^2 Pt. Status: Room:   REFERRING    Donna Callahan, M.D.  ATTENDING    Olga Millers  SONOGRAPHER  Clearence Ped, RCS  PERFORMING   [facility]  ORDERING     Callahan, Jr  REFERRING    Callahan, Jr  cc:  ------------------------------------------------------------------- LV EF: 50% -   55%  ------------------------------------------------------------------- Indications:      Thoracic Aortic Aneurysm without rupture (A21).   ------------------------------------------------------------------- History:   PMH:  CKD, HLD. old echo 9/18 ascending aorta 48mm, mod ai and there was no effusion oppose to some effusion on this study. HLD.  CKD.  Risk factors:  Hypertension.  ------------------------------------------------------------------- Study Conclusions  - Left ventricle: The cavity size was normal. There was mild focal   basal hypertrophy of the septum. Systolic function was normal.   The estimated ejection fraction was in the range of 50% to 55%.   Wall motion was normal; there were no regional wall motion   abnormalities. Doppler parameters are consistent with abnormal   left ventricular relaxation (grade 1 diastolic dysfunction). - Aortic valve: There was mild regurgitation. - Mitral valve: There was mild regurgitation. - Left atrium: The atrium was moderately dilated. - Pericardium, extracardiac: A small pericardial effusion was    identified.  Impressions:  - Normal LV systolic function; mild diastolic dysfunction; mild AI;   severely dilated ascending aorta (4.9 cm; suggest CTA to further   assess); mild MR; moderate LAE; small pericardial effusion.  ------------------------------------------------------------------- Study data:  Comparison was made to the study of 10/13/2016.  Study status:  Routine.  Procedure:  The patient reported no pain pre or post test. Transthoracic echocardiography. Image quality was adequate.          Transthoracic echocardiography.  M-mode, complete 2D, spectral Doppler, and color Doppler.  Birthdate: Patient birthdate: 1931-01-17.  Age:  Patient is 85 yr old.  Sex: Gender: female.    BMI: 23.5 kg/m^2.  Blood pressure:     120/62 Patient status:  Outpatient.  Study date:  Study date: 08/23/2017. Study time: 07:49 AM.  Location:  [facility]  -------------------------------------------------------------------  ------------------------------------------------------------------- Left ventricle:  The cavity size was normal. There was mild focal basal hypertrophy of the septum. Systolic function was normal. The estimated ejection fraction was in the range of 50% to 55%. Wall motion was normal; there were no regional wall motion abnormalities. Doppler parameters are consistent with abnormal left ventricular relaxation (grade 1 diastolic dysfunction).  ------------------------------------------------------------------- Aortic valve:   Trileaflet; normal thickness leaflets. Mobility was not restricted.  Doppler:  Transvalvular velocity was within the normal range. There was no stenosis. There was mild regurgitation.   ------------------------------------------------------------------- Aorta:  Aortic root: The aortic root was normal in size. Ascending aorta: The ascending aorta was severely  dilated.  ------------------------------------------------------------------- Mitral valve:   Structurally normal valve.   Mobility was not restricted.  Doppler:  Transvalvular velocity was within the normal range. There was no evidence for stenosis. There was mild regurgitation.  ------------------------------------------------------------------- Left atrium:  The atrium was moderately dilated.  ------------------------------------------------------------------- Right ventricle:  The cavity size was normal. Systolic function was normal.  ------------------------------------------------------------------- Pulmonic valve:    Doppler:  Transvalvular velocity was within the normal range. There was no evidence for stenosis.  ------------------------------------------------------------------- Tricuspid valve:   Structurally normal valve.    Doppler: Transvalvular velocity was within the normal range. There was trivial regurgitation.  ------------------------------------------------------------------- Pulmonary artery:   Systolic pressure was within the normal range.   ------------------------------------------------------------------- Right atrium:  The atrium was normal in size.  ------------------------------------------------------------------- Pericardium:  A small pericardial effusion was identified.  ------------------------------------------------------------------- Systemic veins: Inferior vena cava: The vessel was normal in size. The respirophasic diameter changes were in the normal range (= 50%), consistent with normal central venous pressure. Diameter: 16 mm.  ------------------------------------------------------------------- Measurements   IVC                                        Value        Reference  ID                                         16    mm     ----------    Left ventricle                             Value        Reference  LV ID, ED,  PLAX chordal                    50.2  mm     43 - 52  LV ID, ES, PLAX chordal                    36    mm     23 - 38  LV fx shortening, PLAX chordal     (L)     28    %      >=29  LV PW thickness, ED                        10.22 mm     ----------  IVS/LV PW ratio, ED                (H)     1.31         <=1.3  Stroke volume, 2D                          104   ml     ----------  Stroke volume/bsa, 2D                      63    ml/m^2 ----------  LV e&', lateral                             9.36  cm/s   ----------  LV E/e&', lateral                           5.6          ----------  LV e&', medial                              3.81  cm/s   ----------  LV E/e&', medial                            13.75        ----------  LV e&', average                             6.59  cm/s   ----------  LV E/e&', average                           7.96         ----------    Ventricular septum                         Value  Reference  IVS thickness, ED                          13.43 mm     ----------    LVOT                                       Value        Reference  LVOT ID, S                                 22    mm     ----------  LVOT area                                  3.8   cm^2   ----------  LVOT peak velocity, S                      104   cm/s   ----------  LVOT mean velocity, S                      72.7  cm/s   ----------  LVOT VTI, S                                27.3  cm     ----------    Aortic valve                               Value        Reference  Aortic regurg pressure half-time           497   ms     ----------    Aorta                                      Value        Reference  Aortic root ID, ED                         37    mm     ----------    Left atrium                                Value        Reference  LA ID, A-P, ES                             28    mm     ----------  LA ID/bsa, A-P                             1.7   cm/m^2 <=2.2  LA volume, S  61.7  ml     ----------  LA volume/bsa, S                           37.5  ml/m^2 ----------  LA volume, ES, 1-p A4C                     77.4  ml     ----------  LA volume/bsa, ES, 1-p A4C                 47.1  ml/m^2 ----------  LA volume, ES, 1-p A2C                     49.6  ml     ----------  LA volume/bsa, ES, 1-p A2C                 30.2  ml/m^2 ----------    Mitral valve                               Value        Reference  Mitral E-wave peak velocity                52.4  cm/s   ----------  Mitral A-wave peak velocity                109   cm/s   ----------  Mitral deceleration time           (H)     232   ms     150 - 230  Mitral E/A ratio, peak                     0.5          ----------    Pulmonary arteries                         Value        Reference  PA pressure, S, DP                         25    mm Hg  <=30    Tricuspid valve                            Value        Reference  Tricuspid regurg peak velocity             236   cm/s   ----------  Tricuspid peak RV-RA gradient              22    mm Hg  ----------  Tricuspid maximal regurg velocity,         236   cm/s   ----------  PISA    Right atrium                               Value        Reference  RA ID, S-I, ES, A4C                (H)     49.4  mm     34 - 49  RA area, ES, A4C  12.7  cm^2   8.3 - 19.5  RA volume, ES, A/L                         26.4  ml     ----------  RA volume/bsa, ES, A/L                     16.1  ml/m^2 ----------    Systemic veins                             Value        Reference  Estimated CVP                              3     mm Hg  ----------    Right ventricle                            Value        Reference  TAPSE                                      21.7  mm     ----------  RV pressure, S, DP                         25    mm Hg  <=30  RV s&', lateral, S                          11.2  cm/s   ----------  Legend: (L)  and  (H)  mark values outside  specified reference range.  ------------------------------------------------------------------- Prepared and Electronically Authenticated by  Olga Millers 2019-08-05T15:13:31   Recent Lab Findings: Lab Results  Component Value Date   WBC 5.7 07/26/2014   HGB 10.0 (L) 07/26/2014   HCT 30.8 (L) 07/26/2014   PLT 217 07/26/2014   GLUCOSE 111 (H) 08/17/2017   ALT 17 07/24/2014   AST 17 07/24/2014   NA 134 08/17/2017   K 4.6 08/17/2017   CL 96 08/17/2017   CREATININE 0.76 08/17/2017   BUN 14 08/17/2017   CO2 21 08/17/2017   INR 1.05 07/23/2014   Aortic Size Index=     4.8    /Body surface area is 1.65 meters squared. =2.9  < 2.75 cm/m2      4% risk per year 2.75 to 4.25          8% risk per year > 4.25 cm/m2    20% risk per year  Aortic Cross section area/ Height ratio=   Chronic Kidney Disease   Stage I     GFR >90  Stage II    GFR 60-89  Stage IIIA GFR 45-59  Stage IIIB GFR 30-44  Stage IV   GFR 15-29  Stage V    GFR  <15  Lab Results  Component Value Date   CREATININE 0.76 08/17/2017   CrCl cannot be calculated (Patient's most recent lab result is older than the maximum 21 days allowed.).  Assessment / Plan:   Patient is an 85 year-old female with a positive family history of aortic dissection with a 4.7 cm dilated  aorta and a relatively small body size.  Follow-up scan today shows sizes remain stable.  I reviewed with the patient the CT scan the diagnosis of dilated ascending aorta and risk of aortic dissection.  The patient is at moderate risk, but she notes that her age of 30, almost 48 years she is not interested in any major surgical intervention.  She would like to follow the size to know if there is any changes.  We will plan to see her back with a CTA in 6 months.  I have cautioned her about strenuous lifting, told her to avoid Donna Callahan, and good blood pressure control is important.  She does note that she has a living will. She notes that her daughter  is 75 years old-with her positive family history and encouraged her to have her daughter screened for dilated aorta.  Delight Ovens MD      [address] [place].[address] [place] [address] Office [phone]   Beeper [phone]  03/24/2018 11:23 AM

## 2018-03-29 DIAGNOSIS — M419 Scoliosis, unspecified: Secondary | ICD-10-CM | POA: Diagnosis not present

## 2018-03-29 DIAGNOSIS — K580 Irritable bowel syndrome with diarrhea: Secondary | ICD-10-CM | POA: Diagnosis not present

## 2018-03-29 DIAGNOSIS — E7849 Other hyperlipidemia: Secondary | ICD-10-CM | POA: Diagnosis not present

## 2018-03-29 DIAGNOSIS — I10 Essential (primary) hypertension: Secondary | ICD-10-CM | POA: Diagnosis not present

## 2018-03-29 DIAGNOSIS — I712 Thoracic aortic aneurysm, without rupture: Secondary | ICD-10-CM | POA: Diagnosis not present

## 2018-03-29 DIAGNOSIS — M5489 Other dorsalgia: Secondary | ICD-10-CM | POA: Diagnosis not present

## 2018-03-29 DIAGNOSIS — Z6824 Body mass index (BMI) 24.0-24.9, adult: Secondary | ICD-10-CM | POA: Diagnosis not present

## 2018-08-08 DIAGNOSIS — K589 Irritable bowel syndrome without diarrhea: Secondary | ICD-10-CM | POA: Diagnosis not present

## 2018-08-08 DIAGNOSIS — G2581 Restless legs syndrome: Secondary | ICD-10-CM | POA: Diagnosis not present

## 2018-08-08 DIAGNOSIS — H547 Unspecified visual loss: Secondary | ICD-10-CM | POA: Diagnosis not present

## 2018-08-08 DIAGNOSIS — E876 Hypokalemia: Secondary | ICD-10-CM | POA: Diagnosis not present

## 2018-08-08 DIAGNOSIS — M545 Low back pain: Secondary | ICD-10-CM | POA: Diagnosis not present

## 2018-08-08 DIAGNOSIS — M1990 Unspecified osteoarthritis, unspecified site: Secondary | ICD-10-CM | POA: Diagnosis not present

## 2018-08-08 DIAGNOSIS — I10 Essential (primary) hypertension: Secondary | ICD-10-CM | POA: Diagnosis not present

## 2018-08-08 DIAGNOSIS — E785 Hyperlipidemia, unspecified: Secondary | ICD-10-CM | POA: Diagnosis not present

## 2018-08-08 DIAGNOSIS — J309 Allergic rhinitis, unspecified: Secondary | ICD-10-CM | POA: Diagnosis not present

## 2018-08-26 ENCOUNTER — Other Ambulatory Visit: Payer: Self-pay | Admitting: *Deleted

## 2018-08-26 DIAGNOSIS — I712 Thoracic aortic aneurysm, without rupture, unspecified: Secondary | ICD-10-CM

## 2018-09-28 ENCOUNTER — Ambulatory Visit: Payer: Medicare PPO | Admitting: Cardiovascular Disease

## 2018-10-03 ENCOUNTER — Encounter: Payer: Self-pay | Admitting: Cardiovascular Disease

## 2018-10-03 ENCOUNTER — Other Ambulatory Visit: Payer: Self-pay

## 2018-10-03 ENCOUNTER — Ambulatory Visit (INDEPENDENT_AMBULATORY_CARE_PROVIDER_SITE_OTHER): Payer: Medicare PPO | Admitting: Cardiovascular Disease

## 2018-10-03 VITALS — BP 116/70 | HR 62 | Ht 63.0 in | Wt 134.0 lb

## 2018-10-03 DIAGNOSIS — I10 Essential (primary) hypertension: Secondary | ICD-10-CM

## 2018-10-03 DIAGNOSIS — I351 Nonrheumatic aortic (valve) insufficiency: Secondary | ICD-10-CM | POA: Diagnosis not present

## 2018-10-03 DIAGNOSIS — I5032 Chronic diastolic (congestive) heart failure: Secondary | ICD-10-CM | POA: Diagnosis not present

## 2018-10-03 DIAGNOSIS — I712 Thoracic aortic aneurysm, without rupture, unspecified: Secondary | ICD-10-CM

## 2018-10-03 NOTE — Patient Instructions (Signed)

## 2018-10-03 NOTE — Progress Notes (Signed)
Cardiology Office Note:    Date:  10/03/2018   ID:  Donna Callahan, DOB 09/03/1930, MRN [id number]  PCP:  Rodrigo Ran, MD  Cardiologist:  Kristeen Miss, MD    Referring MD: Rodrigo Ran, MD   Chief Complaint  Patient presents with  . Hypertension  . Congestive Heart Failure    Donna Callahan is a 85 y.o. female with a hx of hypertension, hyperlipidemia, chronic kidney disease stage II, who was seen by Chelsea Aus, PA and me in Sept. For DOE  BP was elevated at that time.    Having lots of GI issues.   Breathing is better.  Gives out when she walks any distance.    Thinks is due to back pain  Is watching her diet   Echo shows normal systolic function with grade 1 diastolic dysfunction.  She has moderate aortic insufficiency with a moderately dilated aorta.  August 17, 2017:  Having some back pain  Unable to walk - has lots of fatigue .   Leg weakness.  No cp or chest tightness or fullness.  Has mod. - severe dilitation of her ascending aorta ( 4.8 cm)  She reports having a brother and sister die of ruptured aortic aneurism   November 17, 2017: Donna Callahan is seen today.  She has a history of diastolic congestive heart failure.  She has a ascending aortic aneurysm.  She has seen Dr. Tyrone Sage.  She is not interested in any surgical intervention.  BP is a bit high today  Advised her to avoid salty foods   February 17, 2018: Donna Callahan is seen today for follow-up of her hypertension, chronic diastolic congestive heart failure, and  thoracic aortic aneurysm. BP is a little elevated today.  Her blood pressure readings from home indicate an occasional high blood pressure but also several normal/low blood pressure readings.  Her blood pressure ranges anywhere from 116 systolic up to 160 systolic on her home readings.  She tries to avoid eating any extra salt.   October 03, 2018: Currently seen today for follow-up of her hypertension, chronic diastolic congestive heart failure and  thoracic aortic aneurysm.  She has mild aortic insufficiency. Is generally fatigues , especially with exertion   Has a 4.8 cm ascending aortic aneurism.  Followed by Dr. Tyrone Sage.    Past Medical History:  Diagnosis Date  . CKD (chronic kidney disease) stage 2, GFR 60-89 ml/min   . Hard of hearing   . High cholesterol   . Hypertension   . Iron deficiency anemia   . Varicose veins     Past Surgical History:  Procedure Laterality Date  . stab phlebectomy Right 04/27/2016   stab phlebectomy right leg by Josephina Gip MD   . stab phlebectomy  Left 05/11/2016   stab phlebectomy > 20 incisions left leg by Josephina Gip MD   . STAPEDECTOMY     bil ears  . TONSILLECTOMY    . TOTAL KNEE ARTHROPLASTY Right 05/02/2014   Procedure: RIGHT TOTAL KNEE ARTHROPLASTY;  Surgeon: Ollen Gross, MD;  Location: [facility];  Service: Orthopedics;  Laterality: Right;  Marland Kitchen VARICOSE VEIN SURGERY      Current Medications: Current Meds  Medication Sig  . acetaminophen (TYLENOL) 500 MG tablet Take 500-1,000 mg by mouth as needed for moderate pain.   Marland Kitchen amLODipine (NORVASC) 2.5 MG tablet Take 2.5 mg by mouth daily.  . baclofen (LIORESAL) 10 MG tablet Take 5 mg by mouth as needed for muscle spasms.  Marland Kitchen  BIOTIN PO Take by mouth.  . Calcium-Magnesium-Vitamin D (CALCIUM 1200+D3 PO) Take 1 tablet by mouth 2 (two) times daily.  . carvedilol (COREG) 12.5 MG tablet Take 1 tablet (12.5 mg total) by mouth 2 (two) times daily.  Marland Kitchen. doxazosin (CARDURA) 2 MG tablet Take 2 mg by mouth daily.   Marland Kitchen. estradiol (ESTRACE VAGINAL) 0.1 MG/GM vaginal cream Place 1 Applicatorful vaginally 3 (three) times a week.  . loperamide (IMODIUM) 2 MG capsule Take by mouth as needed for diarrhea or loose stools.  . Multiple Vitamins-Minerals (CENTRUM SILVER PO) Take 1 tablet by mouth daily.  . Multiple Vitamins-Minerals (PRESERVISION AREDS 2 PO) Take 1 tablet by mouth 2 (two) times daily.  Marland Kitchen. olmesartan (BENICAR) 40 MG tablet TAKE 1 TABLET BY MOUTH  EVERY DAY **TO REPLACE IRBESARTAN**  . Polyethyl Glycol-Propyl Glycol (SYSTANE OP) Apply 1 drop to eye every morning.  . potassium chloride SA (KLOR-CON M20) 20 MEQ tablet TAKE 3 TABLETS BY MOUTH EVERY DAY  . psyllium (METAMUCIL) 58.6 % powder Take 1 packet by mouth at bedtime as needed (for constipation).   . simethicone (MYLICON) 80 MG chewable tablet Chew 1 tablet by mouth daily.  . sodium chloride (OCEAN) 0.65 % SOLN nasal spray Place 1-2 sprays into both nostrils every morning.  . triamterene-hydrochlorothiazide (MAXZIDE-25) 37.5-25 MG tablet Take 1 tablet by mouth daily.      Allergies:   Benadryl [diphenhydramine hcl (sleep)], Diphenhydramine hcl, Penicillins, Quinolones, Sulfacetamide sodium, Penicillin g, and Sulfa antibiotics   Social History   Socioeconomic History  . Marital status: Widowed    Spouse name: Not on file  . Number of children: Not on file  . Years of education: Not on file  . Highest education level: Not on file  Occupational History  . Not on file  Social Needs  . Financial resource strain: Not on file  . Food insecurity    Worry: Not on file    Inability: Not on file  . Transportation needs    Medical: Not on file    Non-medical: Not on file  Tobacco Use  . Smoking status: Never Smoker  . Smokeless tobacco: Never Used  Substance and Sexual Activity  . Alcohol use: No  . Drug use: No  . Sexual activity: Never    Comment: HUSBAND DIED 2006  Lifestyle  . Physical activity    Days per week: Not on file    Minutes per session: Not on file  . Stress: Not on file  Relationships  . Social Musicianconnections    Talks on phone: Not on file    Gets together: Not on file    Attends religious service: Not on file    Active member of club or organization: Not on file    Attends meetings of clubs or organizations: Not on file    Relationship status: Not on file  Other Topics Concern  . Not on file  Social History Narrative  . Not on file     Family  History: The patient's family history includes AAA (abdominal aortic aneurysm) in an other family member; Aortic dissection in her sister; Bronchiolitis in her mother; Cancer - Lung in her son; Cancer - Prostate in her son; Fibromyalgia in her daughter; Healthy in her grandchild; Heart attack in her father; Hypertension in her mother; Other in an other family member; Pneumonia in her mother. ROS:   Please see the history of present illness.     All other systems reviewed and are negative.  EKGs/Labs/Other Studies Reviewed:    The following studies were reviewed today:     Recent Labs: No results found for requested labs within last 8760 hours.  Recent Lipid Panel No results found for: CHOL, TRIG, HDL, CHOLHDL, VLDL, LDLCALC, LDLDIRECT  Physical Exam:    Physical Exam: Blood pressure 116/70, pulse 62, height 5\' 3"  (1.6 m), weight 134 lb (60.8 kg), SpO2 97 %.  GEN:  Well nourished, well developed in no acute distress HEENT: Normal NECK: No JVD; No carotid bruits LYMPHATICS: No lymphadenopathy CARDIAC: RRR ,soft diastolic murmur  RESPIRATORY:  Clear to auscultation without rales, wheezing or rhonchi  ABDOMEN: Soft, non-tender, non-distended MUSCULOSKELETAL:  No edema; No deformity  SKIN: Warm and dry NEUROLOGIC:  Alert and oriented x 3    EKG:       October 03, 2018: Normal sinus rhythm with a first-degree AV block.  Heart rate is 62.  Poor R wave progression-possible anterior wall myocardial infarction   ASSESSMENT:    1. Essential hypertension   2. Chronic diastolic CHF (congestive heart failure) ([place])   3. Thoracic aortic aneurysm without rupture ([place])   4. Aortic valve insufficiency, etiology of cardiac valve disease unspecified    PLAN:     1. Chronic diastolic congestive heart failure:    Stable.  Continue current meds   2.  Ascending aortic aneurysm:    4.8 cm.   Due to for repeat CT scan this week.  Followed by Dr. Servando Snare.   She asked about weight lifting  restrictions.  I told her that I thought she would be okay as long as she did not lift over 15 pounds which is approximately 2 gallons of milk.   2.  Aortic insufficiency:     Stable  3.  HTN:    She brought a copy of her blood pressure log.  All of her readings are in the normal range.  We will continue with current medications.  I stressed the importance of limiting her salt.    Medication Adjustments/Labs and Tests Ordered: Current medicines are reviewed at length with the patient today.  Concerns regarding medicines are outlined above.  Orders Placed This Encounter  Procedures  . EKG 12-Lead   No orders of the defined types were placed in this encounter.     Gentry Roch, MD  10/03/2018 5:29 PM    [facility]

## 2018-10-06 ENCOUNTER — Other Ambulatory Visit: Payer: Self-pay

## 2018-10-06 ENCOUNTER — Encounter: Payer: Self-pay | Admitting: Cardiothoracic Surgery

## 2018-10-06 ENCOUNTER — Ambulatory Visit
Admission: RE | Admit: 2018-10-06 | Discharge: 2018-10-06 | Disposition: A | Discharge status: Home / Self Care | Payer: Medicare PPO | Source: Ambulatory Visit | Attending: Cardiothoracic Surgery | Admitting: Cardiothoracic Surgery

## 2018-10-06 ENCOUNTER — Ambulatory Visit (INDEPENDENT_AMBULATORY_CARE_PROVIDER_SITE_OTHER): Payer: Medicare PPO | Admitting: Cardiothoracic Surgery

## 2018-10-06 VITALS — BP 149/73 | HR 58 | Temp 97.5°F | Resp 16 | Ht 63.0 in | Wt 134.0 lb

## 2018-10-06 DIAGNOSIS — I712 Thoracic aortic aneurysm, without rupture, unspecified: Secondary | ICD-10-CM

## 2018-10-06 DIAGNOSIS — I7121 Aneurysm of the ascending aorta, without rupture: Secondary | ICD-10-CM

## 2018-10-06 MED ORDER — IOPAMIDOL (ISOVUE-370) INJECTION 76%
75.0000 mL | Freq: Once | INTRAVENOUS | Status: AC | PRN
  Administered 2018-10-06: 75 mL via INTRAVENOUS

## 2018-10-06 NOTE — Patient Instructions (Signed)

## 2018-10-06 NOTE — Progress Notes (Signed)
[address]       [place] [address]             [phone]                    [name] Medical Record #[id number] Date of Birth: 01/14/1931  Referring: Nahser, Deloris Ping, MD Primary Care: Rodrigo Ran, MD Primary Cardiologist: Kristeen Miss, MD  Chief Complaint:    Chief Complaint  Patient presents with  . Thoracic Aortic Aneurysm    ASCENDING ..6 month f/u with CTA CHEST    History of Present Illness:    Donna Callahan 85 y.o. female is seen in the office in follow up   today for dilated ascending aorta.  She denies any change in symptoms, has chronic back pain.  No angina.    The patient is followed by Dr. Elease Hashimoto.  Because of increasing shortness of breath and fatigue with exertion the patient was evaluated by cardiology.  Echocardiogram and a CTA was performed, patient was referred because of a 4.7 cm dilated ascending aorta.  She returns today with a follow-up CTA of the chest.   The patient has a positive family history of aneurysm disease her sister was followed with an ascending aorta dilatation and died of acute aortic dissection in 2008 at age 55. Patient's father died suddenly at age 76 in the 58s, she has one nephew currently followed in our office for a dilated ascending aorta.  She notes that since last seen she is been without significant symptoms of shortness of breath ,heart failure, and notes her blood pressures been better controlled after increasing her blood pressure medications.  [place] and [place] some light work around her house.   Current Activity/ Functional Status:  Patient is independent with mobility/ambulation, transfers, ADL's, IADL's.   Zubrod Score: At the time of surgery this patient's most appropriate activity status/level should be described as: []     0    Normal activity, no symptoms [x]     1    Restricted in physical strenuous activity but ambulatory, able to do out  light work []     2    Ambulatory and capable of self care, unable to do work activities, up and about               >50 % of waking hours                              []     3    Only limited self care, in bed greater than 50% of waking hours []     4    Completely disabled, no self care, confined to bed or chair []     5    Moribund   Past Medical History:  Diagnosis Date  . CKD (chronic kidney disease) stage 2, GFR 60-89 ml/min   . Hard of hearing   . High cholesterol   . Hypertension   . Iron deficiency anemia   . Varicose veins     Past Surgical History:  Procedure Laterality Date  . stab phlebectomy Right 04/27/2016   stab phlebectomy right leg by Josephina Gip MD   . stab phlebectomy  Left 05/11/2016   stab phlebectomy > 20 incisions left leg by Josephina Gip MD   . STAPEDECTOMY     bil ears  .  TONSILLECTOMY    . TOTAL KNEE ARTHROPLASTY Right 05/02/2014   Procedure: RIGHT TOTAL KNEE ARTHROPLASTY;  Surgeon: Ollen Gross, MD;  Location: [facility];  Service: Orthopedics;  Laterality: Right;  Marland Kitchen VARICOSE VEIN SURGERY      Family History  Problem Relation Age of Onset  . Hypertension Mother   . Bronchiolitis Mother   . Pneumonia Mother   . Heart attack Father   . Aortic dissection Sister   . AAA (abdominal aortic aneurysm) Other        3 OTHER SIBLINGS DIED FOR THIS  . Other Other        MOTOR VEHICLE ACC  . Cancer - Lung Son        11/2011  . Cancer - Prostate Son        01/17/2013  . Fibromyalgia Daughter   . Healthy Grandchild        GREAT GRAND CHILDREN   Patient's nephew [name] is followed for dilated  ascending aorta and has had previous coronary artery bypass grafting  Patient's sister [name] was followed with a dilated ascending aorta by Dr. Virgel Gess and died at age 48 with a acute aortic dissection.  Her father died at age 45 of sudden death in 85.   Social History   Tobacco Use  Smoking Status Never Smoker  Smokeless Tobacco Never Used     Social History   Substance and Sexual Activity  Alcohol Use No     Allergies  Allergen Reactions  . Benadryl [Diphenhydramine Hcl (Sleep)] Other (See Comments)    Paradoxical response, hyperactivity  . Diphenhydramine Hcl Other (See Comments)    Paradoxical response, hyperactivity  . Penicillins Hives  . Quinolones     Patient was warned about not using Cipro and similar antibiotics. Recent studies have raised concern that fluoroquinolone antibiotics could be associated with an increased risk of aortic aneurysm Fluoroquinolones have non-antimicrobial properties that might jeopardise the integrity of the extracellular matrix of the vascular wall In a  propensity score matched cohort study in [place], there was a 66% increased rate of aortic aneurysm or dissection associated with oral fluoroquinolone use, compared wit  . Sulfacetamide Sodium Nausea And Vomiting  . Penicillin G Rash  . Sulfa Antibiotics Nausea And Vomiting and Rash    Current Outpatient Medications  Medication Sig Dispense Refill  . acetaminophen (TYLENOL) 500 MG tablet Take 500-1,000 mg by mouth as needed for moderate pain.     Marland Kitchen amLODipine (NORVASC) 2.5 MG tablet Take 2.5 mg by mouth daily.    . baclofen (LIORESAL) 10 MG tablet Take 5 mg by mouth as needed for muscle spasms.    Marland Kitchen BIOTIN PO Take by mouth.    . Calcium-Magnesium-Vitamin D (CALCIUM 1200+D3 PO) Take 1 tablet by mouth 2 (two) times daily.    . carvedilol (COREG) 12.5 MG tablet Take 1 tablet (12.5 mg total) by mouth 2 (two) times daily. 180 tablet 3  . doxazosin (CARDURA) 2 MG tablet Take 2 mg by mouth daily.     Marland Kitchen estradiol (ESTRACE VAGINAL) 0.1 MG/GM vaginal cream Place 1 Applicatorful vaginally 3 (three) times a week.    . loperamide (IMODIUM) 2 MG capsule Take by mouth as needed for diarrhea or loose stools.    . Multiple Vitamins-Minerals (CENTRUM SILVER PO) Take 1 tablet by mouth daily.    . Multiple Vitamins-Minerals (PRESERVISION AREDS 2 PO) Take  1 tablet by mouth 2 (two) times daily.    Marland Kitchen olmesartan (BENICAR) 40  MG tablet TAKE 1 TABLET BY MOUTH EVERY DAY **TO REPLACE IRBESARTAN**  3  . Polyethyl Glycol-Propyl Glycol (SYSTANE OP) Apply 1 drop to eye every morning.    . psyllium (METAMUCIL) 58.6 % powder Take 1 packet by mouth at bedtime as needed (for constipation).     . simethicone (MYLICON) 80 MG chewable tablet Chew 1 tablet by mouth daily.    . sodium chloride (OCEAN) 0.65 % SOLN nasal spray Place 1-2 sprays into both nostrils every morning.    . triamterene-hydrochlorothiazide (MAXZIDE-25) 37.5-25 MG tablet Take 1 tablet by mouth daily.      No current facility-administered medications for this visit.     Pertinent items are noted in HPI.   Review of Systems:     Cardiac Review of Systems: [Y] = yes  or   [ N ] = no   Chest Pain [ N  ]  Resting SOB [  N] Exertional SOB  [  Y]  Orthopnea Aqua.Slicker ]   Pedal Edema [ N ]    Palpitations [N] Syncope  Aqua.Slicker  ]   Presyncope [ N  ]   General Review of Systems: [Y] = yes [  ]=no Constitional: recent weight change [  ];  Wt loss over the last 3 months [   ] anorexia [  ]; fatigue [  ]; nausea [  ]; night sweats [  ]; fever [  ]; or chills [  ];           Eye : blurred vision [  ]; diplopia [   ]; vision changes [  ];  Amaurosis fugax[  ]; Resp: cough [  ];  wheezing[  ];  hemoptysis[  ]; shortness of breath[  ]; paroxysmal nocturnal dyspnea[  ]; dyspnea on exertion[  ]; or orthopnea[  ];  GI:  gallstones[  ], vomiting[  ];  dysphagia[  ]; melena[  ];  hematochezia [  ]; heartburn[  ];   Hx of  Colonoscopy[  ]; GU: kidney stones [  ]; hematuria[  ];   dysuria [ y ];  nocturia[  ];  history of     obstruction [  ]; urinary frequency [  ]             Skin: rash, swelling[  ];, hair loss[  ];  peripheral edema[  ];  or itching[  ]; Musculosketetal: myalgias[  ];  joint swelling[  ];  joint erythema[  ];  joint pain[  ];  back pain[  ];  Heme/Lymph: bruising[  ];  bleeding[  ];  anemia[  ];  Neuro:  TIA[  ];  headaches[  ];  stroke[  ];  vertigo[  ];  seizures[  ];   paresthesias[  ];  difficulty walking[ y ];  Psych:depression[  ]; anxiety[  ];  Endocrine: diabetes[  ];  thyroid dysfunction[  ];  Immunizations: Flu up to date [ y ]; Pneumococcal up to date Blue.Reese  ];  Other:    PHYSICAL EXAMINATION: BP (!) 149/73 (BP Location: Left Arm, Patient Position: Sitting, Cuff Size: Normal)   Pulse (!) 58   Temp (!) 97.5 F (36.4 C)   Resp 16   Ht 5\' 3"  (1.6 m)   Wt 134 lb (60.8 kg)   SpO2 96% Comment: RA  BMI 23.74 kg/m  General appearance: alert, cooperative and no distress Head: Normocephalic, without obvious abnormality, atraumatic Neck: no adenopathy, no carotid bruit, no JVD, supple,  symmetrical, trachea midline and thyroid not enlarged, symmetric, no tenderness/mass/nodules Lymph nodes: Cervical, supraclavicular, and axillary nodes normal. Resp: clear to auscultation bilaterally Cardio: regular rate and rhythm, S1, S2 normal, no murmur, click, rub or gallop GI: soft, non-tender; bowel sounds normal; no masses,  no organomegaly Extremities: extremities normal, atraumatic, no cyanosis or edema and Homans sign is negative, no sign of DVT Neurologic: Grossly normal  Diagnostic Studies & Laboratory data:     Recent Radiology Findings:  Ct Angio Chest Aorta W &/or Wo Contrast  Result Date: 10/06/2018 CLINICAL DATA:  Follow-up TAA EXAM: CT ANGIOGRAPHY CHEST WITH CONTRAST TECHNIQUE: Multidetector CT imaging of the chest was performed using the standard protocol during bolus administration of intravenous contrast. Multiplanar CT image reconstructions and MIPs were obtained to evaluate the vascular anatomy. CONTRAST:  75mL ISOVUE-370 IOPAMIDOL (ISOVUE-370) INJECTION 76% COMPARISON:  03/24/2018, 08/27/2017 FINDINGS: Cardiovascular: Preferential opacification of the thoracic aorta. Unchanged enlargement of the tubular ascending thoracic aorta, measuring 4.8 x 4.8 cm. The aortic valve measures  2.2 cm and the sinuses of Valsalva measure 4.1 cm. Normal caliber of the mildly tortuous descending thoracic aorta. Mixed aortic atherosclerosis. Normal heart size. Left coronary artery calcifications. No pericardial effusion. Mediastinum/Nodes: No enlarged mediastinal, hilar, or axillary lymph nodes. Calcified 1.2 cm nodule of the left thyroid, unchanged. Trachea, and esophagus demonstrate no significant findings. Lungs/Pleura: Bandlike scarring of the dependent left lung. Stable, benign 6 mm pulmonary nodule of the right middle lobe (series 7, image 94). No pleural effusion or pneumothorax. Upper Abdomen: No acute abnormality. Musculoskeletal: No chest wall abnormality. No acute or significant osseous findings. Review of the MIP images confirms the above findings. IMPRESSION: 1. Unchanged enlargement of the tubular ascending thoracic aorta, measuring 4.8 x 4.8 cm. The aortic valve measures 2.2 cm and the sinuses of Valsalva measure 4.1 cm. Normal caliber of the mildly tortuous descending thoracic aorta. 2. Aortic Atherosclerosis (ICD10-I70.0). Aortic aneurysm NOS (ICD10-I71.9). 3.  Coronary artery disease. Electronically Signed   By: Lauralyn PrimesAlex  Bibbey M.D.   On: 10/06/2018 11:55   Ct Angio Chest Aorta W &/or Wo Contrast  Result Date: 03/24/2018 CLINICAL DATA:  Follow-up thoracic aortic aneurysm EXAM: CT ANGIOGRAPHY CHEST WITH CONTRAST TECHNIQUE: Multidetector CT imaging of the chest was performed using the standard protocol during bolus administration of intravenous contrast. Multiplanar CT image reconstructions and MIPs were obtained to evaluate the vascular anatomy. CONTRAST:  75mL ISOVUE-370 IOPAMIDOL (ISOVUE-370) INJECTION 76% [facility] at [address] Results: Creatinine 0.8 mg/dL. COMPARISON:  08/27/2017 FINDINGS: Cardiovascular: Thoracic aorta again demonstrates atherosclerotic calcifications without evidence of dissection. There is fusiform dilatation of  the ascending aorta measuring 4.8 cm in greatest dimension on the axial images at the level of the pulmonary artery. This is stable in appearance when compared with the prior exam. Normal tapering in the thoracic aortic arch is noted. No aneurysmal dilatation in the descending thoracic aorta is noted. The origins of the brachiocephalic vessels are within normal limits. Mild coronary calcifications are seen. The heart is at the upper limits of normal in size. No central pulmonary embolus is seen. The venous drainage from the left occurs via a hypertrophied superior intercostal vein stable from the previous exam. Mediastinum/Nodes: Scattered hypodensities are again identified in the thyroid with a focal calcification on the left. No sizable hilar or mediastinal adenopathy is noted. The esophagus as visualized is within normal limits. Lungs/Pleura: Lungs are well aerated bilaterally. Mild scarring is noted in the left  lower lobe. Mild lingular scarring is noted as well. Stable from the prior exam. No focal infiltrate or sizable effusion is noted. No nodular changes are seen. Upper Abdomen: Visualized upper abdomen shows no acute abnormality. Musculoskeletal: Degenerative changes of the thoracic spine are noted. No acute bony abnormality is seen. Review of the MIP images confirms the above findings. IMPRESSION: Stable ascending thoracic aortic aneurysm. Recommend semi-annual imaging followup by CTA or MRA and referral to cardiothoracic surgery if not already obtained. This recommendation follows 2010 ACCF/AHA/AATS/ACR/ASA/SCA/SCAI/SIR/STS/SVM Guidelines for the Diagnosis and Management of Patients With Thoracic Aortic Disease. Circulation. 2010; 121: Z610-R604. Aortic aneurysm NOS (ICD10-I71.9) Aortic Atherosclerosis (ICD10-I70.0). Electronically Signed   By: Alcide Clever M.D.   On: 03/24/2018 10:07    Ct Angio Chest Aorta W &/or Wo Contrast  Result Date: 08/27/2017 CLINICAL DATA:  Follow-up thoracic aortic aneurysm.  EXAM: CT ANGIOGRAPHY CHEST WITH CONTRAST TECHNIQUE: Multidetector CT imaging of the chest was performed using the standard protocol during bolus administration of intravenous contrast. Multiplanar CT image reconstructions and MIPs were obtained to evaluate the vascular anatomy. CONTRAST:  ISOVUE-370 IOPAMIDOL (ISOVUE-370) INJECTION 76% COMPARISON:  CT abdomen pelvis-07/23/2014 FINDINGS: Vascular Findings: Fusiform aneurysmal dilatation of the ascending thoracic aorta was measurements as follows. The thoracic aorta tapers to a normal caliber at the level of the aortic arch. The descending thoracic aorta is mildly tortuous but of normal caliber. No evidence of thoracic aortic dissection or periaortic stranding on this nongated examination. Conventional configuration of the aortic arch. The branch vessels of the aortic arch appear widely patent throughout their imaged course. Cardiomegaly. Coronary artery calcifications. Small amount of fluid is seen within the pericardial recess. No pericardial effusion. Although this examination was not tailored for the evaluation the pulmonary arteries, there are no discrete filling defects within the central pulmonary arterial tree to suggest central pulmonary embolism. Normal caliber the main pulmonary artery. 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 ------------------------------------------------------------- Thoracic aortic measurements: Sinotubular junction 41 mm as measured in greatest oblique coronal dimension. Proximal ascending aorta 48 mm as measured in greatest oblique axial dimension at the level of the main pulmonary artery (image 51, series 4) and approximately 50 mm in greatest oblique short axis coronal  diameter (coronal image 34, series 7). Aortic arch aorta 29 mm as measured in greatest oblique sagittal dimension. Proximal descending thoracic aorta 27 mm as measured in greatest oblique axial dimension at the level of the main pulmonary artery. Distal descending thoracic aorta 27 mm as measured in greatest oblique axial dimension at the level of the diaphragmatic hiatus. Review of the MIP images confirms the above findings. ------------------------------------------------------------- Non-Vascular Findings: Mediastinum/Lymph Nodes: No bulky mediastinal, hilar axillary lymphadenopathy. Lungs/Pleura: Minimal dependent subpleural ground-glass atelectasis. Minimal subsegmental atelectasis within the left lower lobe. No discrete focal airspace opacities. No pleural effusion or pneumothorax. No discrete pulmonary nodules. The central pulmonary airways appear widely patent. Upper abdomen: Limited early arterial phase evaluation of the upper abdomen demonstrates enlargement of the caliber of the central aspect of the pancreatic duct measuring approximately 5 mm in diameter (image 109, series 4, similar to remote abdominal CT performed 07/2014. Musculoskeletal: No acute or aggressive osseous abnormalities. Moderate rotatory scoliotic curvature of the thoracolumbar spine with dominant mid component convex to the left with associated moderate severe DDD within  the central component of the concavity. Regional soft tissues appear normal. Mildly heterogeneous appearing thyroid gland. Note is made of an approximately 0.5 cm dystrophic calcification within the left lobe of the thyroid. IMPRESSION: 1. Uncomplicated fusiform aneurysmal dilatation of the ascending thoracic aorta measuring 50 mm in diameter. Aortic aneurysm NOS (ICD10-I71.9). 2. Coronary artery calcifications. Aortic Atherosclerosis (ICD10-I70.0). 3. 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 Electronically Signed   By: Simonne Come M.D.   On: 08/27/2017 10:26     I have independently reviewed the above radiology studies  and reviewed the findings with the patient.   ECHO 08/23/2017: Transthoracic Echocardiography  Patient:    Marena, Witts MR #:       [id number] Study Date: 08/23/2017 Gender:     F Age:        87 Height:     160 cm Weight:     60.2 kg BSA:        1.64 m^2 Pt. Status: Room:   REFERRING    Kristeen Miss, M.D.  ATTENDING    Olga Millers  SONOGRAPHER  Clearence Ped, RCS  PERFORMING   [facility]  ORDERING     Nahser, Jr  REFERRING    Nahser, Jr  cc:  ------------------------------------------------------------------- LV EF: 50% -   55%  ------------------------------------------------------------------- Indications:      Thoracic Aortic Aneurysm without rupture (W09).   ------------------------------------------------------------------- History:   PMH:  CKD, HLD. old echo 9/18 ascending aorta 48mm, mod ai and there was no effusion oppose to some effusion on this study. HLD. CKD.  Risk factors:  Hypertension.  ------------------------------------------------------------------- Study Conclusions  - Left ventricle: The cavity size was normal. There was mild focal   basal hypertrophy of the septum. Systolic function was normal.   The estimated ejection fraction was in the range of 50% to 55%.   Wall motion was normal; there were no regional wall motion   abnormalities. Doppler parameters are consistent with abnormal   left ventricular relaxation (grade 1 diastolic dysfunction). - Aortic valve: There was mild regurgitation. - Mitral valve: There was mild regurgitation. - Left atrium: The atrium was moderately dilated. - Pericardium, extracardiac: A small pericardial effusion was   identified.  Impressions:  - Normal LV systolic function; mild diastolic dysfunction;  mild AI;   severely dilated ascending aorta (4.9 cm; suggest CTA to further   assess); mild MR; moderate LAE; small pericardial effusion.  ------------------------------------------------------------------- Study data:  Comparison was made to the study of 10/13/2016.  Study status:  Routine.  Procedure:  The patient reported no pain pre or post test. Transthoracic echocardiography. Image quality was adequate.          Transthoracic echocardiography.  M-mode, complete 2D, spectral Doppler, and color Doppler.  Birthdate: Patient birthdate: [birth year].  Age:  Patient is 85 yr old.  Sex: Gender: female.    BMI: 23.5 kg/m^2.  Blood pressure:     120/62 Patient status:  Outpatient.  Study date:  Study date: 08/23/2017. Study time: 07:49 AM.  Location:  [place] [facility]  -------------------------------------------------------------------  ------------------------------------------------------------------- Left ventricle:  The cavity size was normal. There was mild focal basal hypertrophy of the septum. Systolic function was normal. The estimated ejection fraction was in the range of 50% to 55%. Wall motion was normal; there were no regional wall motion abnormalities. Doppler parameters are consistent with abnormal left ventricular relaxation (  grade 1 diastolic dysfunction).  ------------------------------------------------------------------- Aortic valve:   Trileaflet; normal thickness leaflets. Mobility was not restricted.  Doppler:  Transvalvular velocity was within the normal range. There was no stenosis. There was mild regurgitation.   ------------------------------------------------------------------- Aorta:  Aortic root: The aortic root was normal in size. Ascending aorta: The ascending aorta was severely dilated.  ------------------------------------------------------------------- Mitral valve:   Structurally normal valve.   Mobility was not restricted.  Doppler:   Transvalvular velocity was within the normal range. There was no evidence for stenosis. There was mild regurgitation.  ------------------------------------------------------------------- Left atrium:  The atrium was moderately dilated.  ------------------------------------------------------------------- Right ventricle:  The cavity size was normal. Systolic function was normal.  ------------------------------------------------------------------- Pulmonic valve:    Doppler:  Transvalvular velocity was within the normal range. There was no evidence for stenosis.  ------------------------------------------------------------------- Tricuspid valve:   Structurally normal valve.    Doppler: Transvalvular velocity was within the normal range. There was trivial regurgitation.  ------------------------------------------------------------------- Pulmonary artery:   Systolic pressure was within the normal range.   ------------------------------------------------------------------- Right atrium:  The atrium was normal in size.  ------------------------------------------------------------------- Pericardium:  A small pericardial effusion was identified.  ------------------------------------------------------------------- Systemic veins: Inferior vena cava: The vessel was normal in size. The respirophasic diameter changes were in the normal range (= 50%), consistent with normal central venous pressure. Diameter: 16 mm.  ------------------------------------------------------------------- Measurements   IVC                                        Value        Reference  ID                                         16    mm     ----------    Left ventricle                             Value        Reference  LV ID, ED, PLAX chordal                    50.2  mm     43 - 52  LV ID, ES, PLAX chordal                    36    mm     23 - 38  LV fx shortening, PLAX chordal     (L)     28     %      >=29  LV PW thickness, ED                        10.22 mm     ----------  IVS/LV PW ratio, ED                (H)     1.31         <=1.3  Stroke volume, 2D                          104   ml     ----------  Stroke volume/bsa, 2D  63    ml/m^2 ----------  LV e&', lateral                             9.36  cm/s   ----------  LV E/e&', lateral                           5.6          ----------  LV e&', medial                              3.81  cm/s   ----------  LV E/e&', medial                            13.75        ----------  LV e&', average                             6.59  cm/s   ----------  LV E/e&', average                           7.96         ----------    Ventricular septum                         Value        Reference  IVS thickness, ED                          13.43 mm     ----------    LVOT                                       Value        Reference  LVOT ID, S                                 22    mm     ----------  LVOT area                                  3.8   cm^2   ----------  LVOT peak velocity, S                      104   cm/s   ----------  LVOT mean velocity, S                      72.7  cm/s   ----------  LVOT VTI, S                                27.3  cm     ----------    Aortic valve                               Value  Reference  Aortic regurg pressure half-time           497   ms     ----------    Aorta                                      Value        Reference  Aortic root ID, ED                         37    mm     ----------    Left atrium                                Value        Reference  LA ID, A-P, ES                             28    mm     ----------  LA ID/bsa, A-P                             1.7   cm/m^2 <=2.2  LA volume, S                               61.7  ml     ----------  LA volume/bsa, S                           37.5  ml/m^2 ----------  LA volume, ES, 1-p A4C                     77.4  ml     ----------   LA volume/bsa, ES, 1-p A4C                 47.1  ml/m^2 ----------  LA volume, ES, 1-p A2C                     49.6  ml     ----------  LA volume/bsa, ES, 1-p A2C                 30.2  ml/m^2 ----------    Mitral valve                               Value        Reference  Mitral E-wave peak velocity                52.4  cm/s   ----------  Mitral A-wave peak velocity                109   cm/s   ----------  Mitral deceleration time           (H)     232   ms     150 - 230  Mitral E/A ratio, peak                     0.5          ----------  Pulmonary arteries                         Value        Reference  PA pressure, S, DP                         25    mm Hg  <=30    Tricuspid valve                            Value        Reference  Tricuspid regurg peak velocity             236   cm/s   ----------  Tricuspid peak RV-RA gradient              22    mm Hg  ----------  Tricuspid maximal regurg velocity,         236   cm/s   ----------  PISA    Right atrium                               Value        Reference  RA ID, S-I, ES, A4C                (H)     49.4  mm     34 - 49  RA area, ES, A4C                           12.7  cm^2   8.3 - 19.5  RA volume, ES, A/L                         26.4  ml     ----------  RA volume/bsa, ES, A/L                     16.1  ml/m^2 ----------    Systemic veins                             Value        Reference  Estimated CVP                              3     mm Hg  ----------    Right ventricle                            Value        Reference  TAPSE                                      21.7  mm     ----------  RV pressure, S, DP                         25    mm Hg  <=30  RV s&', lateral, S  11.2  cm/s   ----------  Legend: (L)  and  (H)  mark values outside specified reference range.  ------------------------------------------------------------------- Prepared and Electronically Authenticated by  Olga Millers  2019-08-05T15:13:31   Recent Lab Findings: Lab Results  Component Value Date   WBC 5.7 07/26/2014   HGB 10.0 (L) 07/26/2014   HCT 30.8 (L) 07/26/2014   PLT 217 07/26/2014   GLUCOSE 111 (H) 08/17/2017   ALT 17 07/24/2014   AST 17 07/24/2014   NA 134 08/17/2017   K 4.6 08/17/2017   CL 96 08/17/2017   CREATININE 0.76 08/17/2017   BUN 14 08/17/2017   CO2 21 08/17/2017   INR 1.05 07/23/2014   Aortic Size Index=     4.8    /Body surface area is 1.64 meters squared. =2.9  < 2.75 cm/m2      4% risk per year 2.75 to 4.25          8% risk per year > 4.25 cm/m2    20% risk per year  Aortic Cross section area/ Height ratio=   Chronic Kidney Disease   Stage I     GFR >90  Stage II    GFR 60-89  Stage IIIA GFR 45-59  Stage IIIB GFR 30-44  Stage IV   GFR 15-29  Stage V    GFR  <15  Lab Results  Component Value Date   CREATININE 0.76 08/17/2017   CrCl cannot be calculated (Patient's most recent lab result is older than the maximum 21 days allowed.).  Assessment / Plan:   1/  Unchanged enlargement of the tubular ascending thoracic aorta, measuring 4.8 x 4.8 cm. The aortic valve measures 2.2 cm and the sinuses of Valsalva measure 4.1 cm. Normal caliber of the mildly tortuous descending thoracic aorta.  Patient is an 85 year-old female with a positive family history of aortic dissection with a 4.7 cm dilated aorta and a relatively small body size.  Follow-up scan today shows the size of her aorta remains stable.  I reviewed with the patient a CT scan with diagnosis of dilated ascending aorta along with the risk of dissection at age 85 she has not made interested in any major surgical interventions but would like to follow the size of her aorta to know if there are any changes.  We will plan to see her back with a CTA of the chest in 12 months.  She was cautioned about strenuous lifting, need for good blood pressure control, and to avoid use of quinolones.  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    Follow-up scan today shows sizes remain stable.  I reviewed with the patient the CT scan the diagnosis of dilated ascending aorta and risk of aortic dissection.  The patient is at moderate risk, but she notes that her age of 58, almost 40 years she is not interested in any major surgical intervention.  She would like to follow the size to know if there is any changes.  We will plan to see her back with a CTA in 6 months.  Greer Ee MD      [address] [place].[address] [place] [address] Office [phone]   Beeper [phone]  10/06/2018 12:57 PM

## 2018-10-27 ENCOUNTER — Other Ambulatory Visit: Payer: Self-pay | Admitting: Cardiovascular Disease

## 2018-10-31 DIAGNOSIS — I129 Hypertensive chronic kidney disease with stage 1 through stage 4 chronic kidney disease, or unspecified chronic kidney disease: Secondary | ICD-10-CM | POA: Diagnosis not present

## 2018-10-31 DIAGNOSIS — R300 Dysuria: Secondary | ICD-10-CM | POA: Diagnosis not present

## 2018-10-31 DIAGNOSIS — D509 Iron deficiency anemia, unspecified: Secondary | ICD-10-CM | POA: Diagnosis not present

## 2018-10-31 DIAGNOSIS — K922 Gastrointestinal hemorrhage, unspecified: Secondary | ICD-10-CM | POA: Diagnosis not present

## 2018-10-31 DIAGNOSIS — R509 Fever, unspecified: Secondary | ICD-10-CM | POA: Diagnosis not present

## 2018-10-31 DIAGNOSIS — R5381 Other malaise: Secondary | ICD-10-CM | POA: Diagnosis not present

## 2018-10-31 DIAGNOSIS — N182 Chronic kidney disease, stage 2 (mild): Secondary | ICD-10-CM | POA: Diagnosis not present

## 2018-10-31 DIAGNOSIS — N390 Urinary tract infection, site not specified: Secondary | ICD-10-CM | POA: Diagnosis not present

## 2018-10-31 DIAGNOSIS — R630 Anorexia: Secondary | ICD-10-CM | POA: Diagnosis not present

## 2018-11-07 DIAGNOSIS — E7849 Other hyperlipidemia: Secondary | ICD-10-CM | POA: Diagnosis not present

## 2018-11-07 DIAGNOSIS — M859 Disorder of bone density and structure, unspecified: Secondary | ICD-10-CM | POA: Diagnosis not present

## 2018-11-10 DIAGNOSIS — R82998 Other abnormal findings in urine: Secondary | ICD-10-CM | POA: Diagnosis not present

## 2018-11-10 DIAGNOSIS — R5381 Other malaise: Secondary | ICD-10-CM | POA: Diagnosis not present

## 2018-11-10 DIAGNOSIS — D509 Iron deficiency anemia, unspecified: Secondary | ICD-10-CM | POA: Diagnosis not present

## 2018-11-10 DIAGNOSIS — M549 Dorsalgia, unspecified: Secondary | ICD-10-CM | POA: Diagnosis not present

## 2018-11-10 DIAGNOSIS — Z1331 Encounter for screening for depression: Secondary | ICD-10-CM | POA: Diagnosis not present

## 2018-11-10 DIAGNOSIS — I712 Thoracic aortic aneurysm, without rupture: Secondary | ICD-10-CM | POA: Diagnosis not present

## 2018-11-10 DIAGNOSIS — I700 Atherosclerosis of aorta: Secondary | ICD-10-CM | POA: Diagnosis not present

## 2018-11-10 DIAGNOSIS — M419 Scoliosis, unspecified: Secondary | ICD-10-CM | POA: Diagnosis not present

## 2018-11-10 DIAGNOSIS — I10 Essential (primary) hypertension: Secondary | ICD-10-CM | POA: Diagnosis not present

## 2018-11-10 DIAGNOSIS — Z23 Encounter for immunization: Secondary | ICD-10-CM | POA: Diagnosis not present

## 2018-11-10 DIAGNOSIS — Z0000 Encounter for general adult medical examination without abnormal findings: Secondary | ICD-10-CM | POA: Diagnosis not present

## 2018-11-10 DIAGNOSIS — K922 Gastrointestinal hemorrhage, unspecified: Secondary | ICD-10-CM | POA: Diagnosis not present

## 2018-11-16 DIAGNOSIS — K591 Functional diarrhea: Secondary | ICD-10-CM | POA: Diagnosis not present

## 2018-11-16 DIAGNOSIS — R1312 Dysphagia, oropharyngeal phase: Secondary | ICD-10-CM | POA: Diagnosis not present

## 2018-11-16 DIAGNOSIS — R159 Full incontinence of feces: Secondary | ICD-10-CM | POA: Diagnosis not present

## 2018-11-17 ENCOUNTER — Other Ambulatory Visit (HOSPITAL_COMMUNITY): Payer: Self-pay | Admitting: *Deleted

## 2018-11-17 DIAGNOSIS — R1310 Dysphagia, unspecified: Secondary | ICD-10-CM

## 2018-12-06 ENCOUNTER — Ambulatory Visit (HOSPITAL_COMMUNITY)
Admission: RE | Admit: 2018-12-06 | Discharge: 2018-12-06 | Disposition: A | Discharge status: Home / Self Care | Payer: Medicare PPO | Source: Ambulatory Visit | Attending: Gastroenterology | Admitting: Gastroenterology

## 2018-12-06 ENCOUNTER — Ambulatory Visit: Payer: Medicare PPO | Admitting: Physician Assistant

## 2018-12-06 ENCOUNTER — Other Ambulatory Visit: Payer: Self-pay

## 2018-12-06 DIAGNOSIS — K219 Gastro-esophageal reflux disease without esophagitis: Secondary | ICD-10-CM | POA: Diagnosis not present

## 2018-12-06 DIAGNOSIS — R1310 Dysphagia, unspecified: Secondary | ICD-10-CM | POA: Diagnosis not present

## 2018-12-07 DIAGNOSIS — Z1231 Encounter for screening mammogram for malignant neoplasm of breast: Secondary | ICD-10-CM | POA: Diagnosis not present

## 2018-12-23 ENCOUNTER — Ambulatory Visit (INDEPENDENT_AMBULATORY_CARE_PROVIDER_SITE_OTHER): Payer: Medicare PPO | Admitting: Cardiology

## 2018-12-23 ENCOUNTER — Encounter: Payer: Self-pay | Admitting: Cardiology

## 2018-12-23 ENCOUNTER — Other Ambulatory Visit: Payer: Self-pay

## 2018-12-23 VITALS — BP 132/58 | HR 58 | Ht 63.0 in | Wt 121.1 lb

## 2018-12-23 DIAGNOSIS — I351 Nonrheumatic aortic (valve) insufficiency: Secondary | ICD-10-CM

## 2018-12-23 DIAGNOSIS — I5032 Chronic diastolic (congestive) heart failure: Secondary | ICD-10-CM | POA: Diagnosis not present

## 2018-12-23 DIAGNOSIS — I10 Essential (primary) hypertension: Secondary | ICD-10-CM

## 2018-12-23 DIAGNOSIS — R0600 Dyspnea, unspecified: Secondary | ICD-10-CM

## 2018-12-23 DIAGNOSIS — I712 Thoracic aortic aneurysm, without rupture, unspecified: Secondary | ICD-10-CM

## 2018-12-23 DIAGNOSIS — R0609 Other forms of dyspnea: Secondary | ICD-10-CM

## 2018-12-23 DIAGNOSIS — R6889 Other general symptoms and signs: Secondary | ICD-10-CM | POA: Diagnosis not present

## 2018-12-23 NOTE — Progress Notes (Signed)
Cardiology Office Note:    Date:  12/23/2018   ID:  Donna Callahan, DOB December 01, 1930, MRN [id number]  PCP:  Rodrigo Ran, MD  Cardiologist:  Kristeen Miss, MD  Referring MD: Rodrigo Ran, MD   Chief Complaint  Patient presents with   Shortness of Breath    with activity    History of Present Illness:    Donna Callahan is a 85 y.o. female with a past medical history significant for chronic diastolic CHF, hypertension, hyperlipidemia, CKD stage II, osteoarthritis and scoliosis.  She also has multiple GI issues.  She had a barium swallow in November 2020 that showed a possible stricture.  She has an ascending aortic aneurysm and has seen Dr. Tyrone Sage, 4.8 cm by CT in 09/2018 which was stable from prior CT 6 months.  She is not interested in any surgical intervention.  She was advised to avoid heavy lifting, avoid quinolones and maintain good blood pressure control.  Patient had a low risk Myoview in 08/2017.  Echocardiogram August 23, 2017 showed EF 50-55%, no regional wall motion abnormalities, grade 1 diastolic dysfunction, mild aortic regurgitation, mild mitral regurgitation, small pericardial effusion.  Donna Callahan is here today for evaluation of DOE. She had her annual physical with Dr. Waynard Edwards in October and she complained of shortness of breath. She was having more DOE. [place] and goes to the barn to feed the cats. She has mild DOE with that and walking to the mailbox as well as fatigue. She is not feeling like she used to, feels like this is a change. She continues to have back pain which thinks may be related to her scoliosis.  She notes that last week around Thanksgiving she had been blowing leaves (she has a lightweight blower) and she had some chest soreness for about 3 days. She did not have significant shortness of breath with the acitvity but the next day she noted shortness of breath- like she couldn't get a deep breath. This lasted for about 2 days and then she has  been OK. By Monday morning she was better.    She denies orthopnea, PND or edema. She wears elastic stockings due to prior phlebectomy.   Cardiac studies   [facility] August 23, 2017 Study Highlights    Nuclear stress EF: 66%. No wall motion abnormalities  There was no ST segment deviation noted during stress.  Defect 1: There is a small defect of mild severity present in the mid anterior and apical anterior location. Possible small apical anterior infarct.  This is a low risk study. No significant ischemia identified.     Echocardiogram August 23, 2017 Study Conclusions  - Left ventricle: The cavity size was normal. There was mild focal   basal hypertrophy of the septum. Systolic function was normal.   The estimated ejection fraction was in the range of 50% to 55%.   Wall motion was normal; there were no regional wall motion   abnormalities. Doppler parameters are consistent with abnormal   left ventricular relaxation (grade 1 diastolic dysfunction). - Aortic valve: There was mild regurgitation. - Mitral valve: There was mild regurgitation. - Left atrium: The atrium was moderately dilated. - Pericardium, extracardiac: A small pericardial effusion was   identified.  Impressions: - Normal LV systolic function; mild diastolic dysfunction; mild AI;   severely dilated ascending aorta (4.9 cm; suggest CTA to further   assess); mild MR; moderate LAE; small pericardial effusion.  Past Medical History:  Diagnosis Date   CKD (chronic kidney disease) stage 2, GFR 60-89 ml/min    Hard of hearing    High cholesterol    Hypertension    Iron deficiency anemia    Varicose veins     Past Surgical History:  Procedure Laterality Date   stab phlebectomy Right 04/27/2016   stab phlebectomy right leg by Josephina Gip MD    stab phlebectomy  Left 05/11/2016   stab phlebectomy > 20 incisions left leg by Josephina Gip MD    STAPEDECTOMY     bil ears   TONSILLECTOMY      TOTAL KNEE ARTHROPLASTY Right 05/02/2014   Procedure: RIGHT TOTAL KNEE ARTHROPLASTY;  Surgeon: Ollen Gross, MD;  Location: [facility];  Service: Orthopedics;  Laterality: Right;   VARICOSE VEIN SURGERY      Current Medications: Current Meds  Medication Sig   acetaminophen (TYLENOL) 500 MG tablet Take 500-1,000 mg by mouth as needed for moderate pain.    amLODipine (NORVASC) 2.5 MG tablet Take 2.5 mg by mouth daily.   baclofen (LIORESAL) 10 MG tablet Take 5 mg by mouth as needed for muscle spasms.   BIOTIN PO Take by mouth.   Calcium-Magnesium-Vitamin D (CALCIUM 1200+D3 PO) Take 1 tablet by mouth 2 (two) times daily.   carvedilol (COREG) 12.5 MG tablet TAKE 1 TABLET (12.5 MG TOTAL) BY MOUTH 2 (TWO) TIMES DAILY.   doxazosin (CARDURA) 2 MG tablet Take 2 mg by mouth daily.    estradiol (ESTRACE VAGINAL) 0.1 MG/GM vaginal cream Place 1 Applicatorful vaginally 3 (three) times a week.   loperamide (IMODIUM) 2 MG capsule Take by mouth as needed for diarrhea or loose stools.   Multiple Vitamins-Minerals (CENTRUM SILVER PO) Take 1 tablet by mouth daily.   Multiple Vitamins-Minerals (PRESERVISION AREDS 2 PO) Take 1 tablet by mouth 2 (two) times daily.   olmesartan (BENICAR) 40 MG tablet TAKE 1 TABLET BY MOUTH EVERY DAY **TO REPLACE IRBESARTAN**   Polyethyl Glycol-Propyl Glycol (SYSTANE OP) Apply 1 drop to eye every morning.   potassium chloride SA (KLOR-CON) 20 MEQ tablet Take 20 mEq by mouth 2 (two) times daily.   psyllium (METAMUCIL) 58.6 % powder Take 1 packet by mouth at bedtime as needed (for constipation).    simethicone (MYLICON) 80 MG chewable tablet Chew 1 tablet by mouth daily.   sodium chloride (OCEAN) 0.65 % SOLN nasal spray Place 1-2 sprays into both nostrils every morning.   triamterene-hydrochlorothiazide (MAXZIDE-25) 37.5-25 MG tablet Take 1 tablet by mouth daily.    [DISCONTINUED] potassium chloride 20 MEQ/15ML (10%) SOLN Take 20 mEq by mouth daily.      Allergies:   Benadryl [diphenhydramine hcl (sleep)], Diphenhydramine hcl, Penicillins, Quinolones, Sulfacetamide sodium, Penicillin g, and Sulfa antibiotics   Social History   Socioeconomic History   Marital status: Widowed    Spouse name: Not on file   Number of children: Not on file   Years of education: Not on file   Highest education level: Not on file  Occupational History   Not on file  Social Needs   Financial resource strain: Not on file   Food insecurity    Worry: Not on file    Inability: Not on file   Transportation needs    Medical: Not on file    Non-medical: Not on file  Tobacco Use   Smoking status: Never Smoker   Smokeless tobacco: Never Used  Substance and Sexual Activity   Alcohol use: No   Drug use: No  Sexual activity: Never    Comment: HUSBAND DIED 11-May-2004  Lifestyle   Physical activity    Days per week: Not on file    Minutes per session: Not on file   Stress: Not on file  Relationships   Social connections    Talks on phone: Not on file    Gets together: Not on file    Attends religious service: Not on file    Active member of club or organization: Not on file    Attends meetings of clubs or organizations: Not on file    Relationship status: Not on file  Other Topics Concern   Not on file  Social History Narrative   Not on file     Family History: The patient's family history includes AAA (abdominal aortic aneurysm) in an other family member; Aortic dissection in her sister; Bronchiolitis in her mother; Cancer - Lung in her son; Cancer - Prostate in her son; Fibromyalgia in her daughter; Healthy in her grandchild; Heart attack in her father; Hypertension in her mother; Other in an other family member; Pneumonia in her mother. ROS:   Please see the history of present illness.     All other systems reviewed and are negative.   EKG:  EKG is ordered today.  The ekg ordered today demonstrates sinus bradycardia with  first-degree AV block, 49 bpm, low voltage QRS, possible old septal infarct.  No acute ST/T changes.  Recent Labs: No results found for requested labs within last 8760 hours.   Recent Lipid Panel No results found for: CHOL, TRIG, HDL, CHOLHDL, VLDL, LDLCALC, LDLDIRECT  Physical Exam:    VS:  BP (!) 132/58    Pulse (!) 58    Ht 5\' 3"  (1.6 m)    Wt 121 lb 1.9 oz (54.9 kg)    SpO2 95%    BMI 21.46 kg/m     Wt Readings from Last 6 Encounters:  12/23/18 121 lb 1.9 oz (54.9 kg)  10/06/18 134 lb (60.8 kg)  10/03/18 134 lb (60.8 kg)  03/24/18 135 lb 9.6 oz (61.5 kg)  03/15/18 136 lb (61.7 kg)  02/17/18 135 lb 12.8 oz (61.6 kg)     Physical Exam  Constitutional: She is oriented to person, place, and time. She appears well-developed and well-nourished. No distress.  HENT:  Head: Normocephalic and atraumatic.  Neck: Normal range of motion. Neck supple. No JVD present.  Cardiovascular: Normal rate, regular rhythm, normal heart sounds and intact distal pulses. Exam reveals no gallop and no friction rub.  No murmur heard. Occasional skipped beat on exam  Pulmonary/Chest: Effort normal and breath sounds normal. No respiratory distress. She has no wheezes. She has no rales.  Abdominal: Soft. Bowel sounds are normal. She exhibits no distension.  Musculoskeletal: Normal range of motion.        General: No edema.  Neurological: She is alert and oriented to person, place, and time.  Skin: Skin is warm and dry.  Psychiatric: She has a normal mood and affect. Her behavior is normal. Judgment and thought content normal.  Vitals reviewed.    ASSESSMENT:    1. DOE (dyspnea on exertion)   2. Chronic diastolic CHF (congestive heart failure) ([place])   3. Thoracic aortic aneurysm without rupture ([place])   4. Aortic valve insufficiency, etiology of cardiac valve disease unspecified   5. Essential (primary) hypertension   6. Activity intolerance    PLAN:    In order of problems listed  above:  DOE -  Patient with increased dyspnea on exertion with her usual daily activities.  No chest pain/pressure/discomfort.  She feels like she is more fatigued and has less activity tolerance than previously. -Pt does not appear volume overloaded.  -Will check lexiscan myoview and echo for her noted change in activity tolerance.   Chronic diastolic CHF -Stable on current medications.  Thoracic aortic aneurysm -Positive family history of aortic dissection.  She has 4.7 cm thoracic aortic aneurysm.  Has been evaluated by CT surgery and is not interested in surgery.  Follow-up CT after 6 months was stable. -Per Dr. Tyrone SageGerhardt, cautioned to avoid strenuous lifting,  avoid quinolones, and maintain good blood pressure control.    Aortic insufficiency -Mild aortic insufficiency and mild mitral regurgitation by echocardiogram in 08/2017. -Asymptomatic.  Continue to monitor.  Hypertension -On Amlodipine 2.5 mg, carvedilol 12.5 mg twice daily, doxazosin 2 mg daily, olmesartan 40 mg and triamterene hydrochlorothiazide.  -Low-sodium diet   Medication Adjustments/Labs and Tests Ordered: Current medicines are reviewed at length with the patient today.  Concerns regarding medicines are outlined above. Labs and tests ordered and medication changes are outlined in the patient instructions below:  Patient Instructions  Medication Instructions:  Your physician recommends that you continue on your current medications as directed. Please refer to the Current Medication list given to you today.  *If you need a refill on your cardiac medications before your next appointment, please call your pharmacy*  Lab Work: None ordered   If you have labs (blood work) drawn today and your tests are completely normal, you will receive your results only by:  MyChart Message (if you have MyChart) OR  A paper copy in the mail If you have any lab test that is abnormal or we need to change your treatment, we will call  you to review the results.  Testing/Procedures: Your physician has requested that you have a lexiscan myoview. For further information please visit [email or web address]. Please follow instruction sheet, as given.  Your physician has requested that you have an echocardiogram. Echocardiography is a painless test that uses sound waves to create images of your heart. It provides your doctor with information about the size and shape of your heart and how well your hearts chambers and valves are working. This procedure takes approximately one hour. There are no restrictions for this procedure.    Follow-Up: At [facility], you and your health needs are our priority.  As part of our continuing mission to provide you with exceptional heart care, we have created designated Provider Care Teams.  These Care Teams include your primary Cardiologist (physician) and Advanced Practice Providers (APPs -  Physician Assistants and Nurse Practitioners) who all work together to provide you with the care you need, when you need it.  Your next appointment:   6 month(s)  The format for your next appointment:   In Person  Provider:   You may see Kristeen MissPhilip Nahser, MD or one of the following Advanced Practice Providers on your designated Care Team:    Tereso NewcomerScott Weaver, PA-C  Vin LipscombBhagat, [place]PA-C  Berton BonJanine Kalan Yeley, NP   Other Instructions      Signed, Berton BonJanine Leshawn Straka, NP  12/23/2018 6:24 PM    [facility]

## 2018-12-23 NOTE — Patient Instructions (Addendum)
Medication Instructions:  Your physician recommends that you continue on your current medications as directed. Please refer to the Current Medication list given to you today.  *If you need a refill on your cardiac medications before your next appointment, please call your pharmacy*  Lab Work: None ordered   If you have labs (blood work) drawn today and your tests are completely normal, you will receive your results only by: Marland Kitchen MyChart Message (if you have MyChart) OR . A paper copy in the mail If you have any lab test that is abnormal or we need to change your treatment, we will call you to review the results.  Testing/Procedures: Your physician has requested that you have a lexiscan myoview. For further information please visit [email or web address]. Please follow instruction sheet, as given.  Your physician has requested that you have an echocardiogram. Echocardiography is a painless test that uses sound waves to create images of your heart. It provides your doctor with information about the size and shape of your heart and how well your heart's chambers and valves are working. This procedure takes approximately one hour. There are no restrictions for this procedure.    Follow-Up: At [facility], you and your health needs are our priority.  As part of our continuing mission to provide you with exceptional heart care, we have created designated Provider Care Teams.  These Care Teams include your primary Cardiologist (physician) and Advanced Practice Providers (APPs -  Physician Assistants and Nurse Practitioners) who all work together to provide you with the care you need, when you need it.  Your next appointment:   6 month(s)  The format for your next appointment:   In Person  Provider:   You may see Mertie Moores, MD or one of the following Advanced Practice Providers on your designated Care Team:    Richardson Dopp, PA-C  [place], [place]  Daune Perch, NP   Other  Instructions

## 2018-12-26 NOTE — Addendum Note (Signed)
Addended by: Mendel Ryder on: 12/26/2018 04:15 PM   Modules accepted: Orders

## 2018-12-28 DIAGNOSIS — K591 Functional diarrhea: Secondary | ICD-10-CM | POA: Diagnosis not present

## 2018-12-28 DIAGNOSIS — R933 Abnormal findings on diagnostic imaging of other parts of digestive tract: Secondary | ICD-10-CM | POA: Diagnosis not present

## 2018-12-28 DIAGNOSIS — K117 Disturbances of salivary secretion: Secondary | ICD-10-CM | POA: Diagnosis not present

## 2018-12-28 DIAGNOSIS — R1312 Dysphagia, oropharyngeal phase: Secondary | ICD-10-CM | POA: Diagnosis not present

## 2019-01-04 ENCOUNTER — Telehealth (HOSPITAL_COMMUNITY): Payer: Self-pay | Admitting: *Deleted

## 2019-01-04 NOTE — Telephone Encounter (Signed)
Patient given detailed instructions per Myocardial Perfusion Study Information Sheet for the test on 01/09/19 at 10:00. Patient notified to arrive 15 minutes early and that it is imperative to arrive on time for appointment to keep from having the test rescheduled.  If you need to cancel or reschedule your appointment, please call the office within 24 hours of your appointment. . Patient verbalized understanding.Donna Callahan

## 2019-01-09 ENCOUNTER — Ambulatory Visit (HOSPITAL_BASED_OUTPATIENT_CLINIC_OR_DEPARTMENT_OTHER): Payer: Medicare PPO

## 2019-01-09 ENCOUNTER — Ambulatory Visit (HOSPITAL_COMMUNITY): Payer: Medicare PPO | Attending: Cardiovascular Disease

## 2019-01-09 ENCOUNTER — Other Ambulatory Visit: Payer: Self-pay

## 2019-01-09 DIAGNOSIS — R6889 Other general symptoms and signs: Secondary | ICD-10-CM

## 2019-01-09 DIAGNOSIS — R0600 Dyspnea, unspecified: Secondary | ICD-10-CM

## 2019-01-09 DIAGNOSIS — R0609 Other forms of dyspnea: Secondary | ICD-10-CM

## 2019-01-09 LAB — MYOCARDIAL PERFUSION IMAGING
LV dias vol: 108 mL (ref 46–106)
LV sys vol: 39 mL
Peak HR: 75 {beats}/min
Rest HR: 53 {beats}/min
SDS: 1
SRS: 2
SSS: 3
TID: 1.17

## 2019-01-09 LAB — ECHOCARDIOGRAM COMPLETE
Height: 63 in
Weight: 1936 oz

## 2019-01-09 MED ORDER — REGADENOSON 0.4 MG/5ML IV SOLN
0.4000 mg | Freq: Once | INTRAVENOUS | Status: AC
  Administered 2019-01-09: 0.4 mg via INTRAVENOUS

## 2019-01-09 MED ORDER — TECHNETIUM TC 99M TETROFOSMIN IV KIT
10.9000 | PACK | Freq: Once | INTRAVENOUS | Status: AC | PRN
  Administered 2019-01-09: 10.9 via INTRAVENOUS
  Filled 2019-01-09: qty 11

## 2019-01-09 MED ORDER — TECHNETIUM TC 99M TETROFOSMIN IV KIT
32.2000 | PACK | Freq: Once | INTRAVENOUS | Status: AC | PRN
  Administered 2019-01-09: 32.2 via INTRAVENOUS
  Filled 2019-01-09: qty 33

## 2019-01-10 ENCOUNTER — Encounter (INDEPENDENT_AMBULATORY_CARE_PROVIDER_SITE_OTHER): Payer: Self-pay | Admitting: Otolaryngology

## 2019-01-10 ENCOUNTER — Telehealth: Payer: Self-pay | Admitting: Cardiovascular Disease

## 2019-01-10 ENCOUNTER — Ambulatory Visit (INDEPENDENT_AMBULATORY_CARE_PROVIDER_SITE_OTHER): Payer: Medicare PPO | Admitting: Otolaryngology

## 2019-01-10 VITALS — Temp 97.7°F

## 2019-01-10 DIAGNOSIS — H6123 Impacted cerumen, bilateral: Secondary | ICD-10-CM

## 2019-01-10 NOTE — Progress Notes (Signed)
HPI: Donna Callahan is a 85 y.o. female who presents for evaluation of cerumen buildup referred by [facility].  Left side worse thInetta Fermoan right.  She had previous stapes surgery with Dr. Anner CreteWells over 30 years ago.Marland Kitchen.  She wears bilateral hearing aids.  Past Medical History:  Diagnosis Date  . CKD (chronic kidney disease) stage 2, GFR 60-89 ml/min   . Hard of hearing   . High cholesterol   . Hypertension   . Iron deficiency anemia   . Varicose veins    Past Surgical History:  Procedure Laterality Date  . stab phlebectomy Right 04/27/2016   stab phlebectomy right leg by Josephina GipJames Lawson MD   . stab phlebectomy  Left 05/11/2016   stab phlebectomy > 20 incisions left leg by Josephina GipJames Lawson MD   . STAPEDECTOMY     bil ears  . TONSILLECTOMY    . TOTAL KNEE ARTHROPLASTY Right 05/02/2014   Procedure: RIGHT TOTAL KNEE ARTHROPLASTY;  Surgeon: Ollen GrossFrank Aluisio, MD;  Location: [facility];  Service: Orthopedics;  Laterality: Right;  Marland Kitchen. VARICOSE VEIN SURGERY     Social History   Socioeconomic History  . Marital status: Widowed    Spouse name: Not on file  . Number of children: Not on file  . Years of education: Not on file  . Highest education level: Not on file  Occupational History  . Not on file  Tobacco Use  . Smoking status: Never Smoker  . Smokeless tobacco: Never Used  Substance and Sexual Activity  . Alcohol use: No  . Drug use: No  . Sexual activity: Never    Comment: HUSBAND DIED 2006  Other Topics Concern  . Not on file  Social History Narrative  . Not on file   Social Determinants of Health   Financial Resource Strain:   . Difficulty of Paying Living Expenses: Not on file  Food Insecurity:   . Worried About Programme researcher, broadcasting/film/videounning Out of Food in the Last Year: Not on file  . Ran Out of Food in the Last Year: Not on file  Transportation Needs:   . Lack of Transportation (Medical): Not on file  . Lack of Transportation (Non-Medical): Not on file  Physical Activity:   . Days of Exercise per Week: Not  on file  . Minutes of Exercise per Session: Not on file  Stress:   . Feeling of Stress : Not on file  Social Connections:   . Frequency of Communication with Friends and Family: Not on file  . Frequency of Social Gatherings with Friends and Family: Not on file  . Attends Religious Services: Not on file  . Active Member of Clubs or Organizations: Not on file  . Attends BankerClub or Organization Meetings: Not on file  . Marital Status: Not on file   Family History  Problem Relation Age of Onset  . Hypertension Mother   . Bronchiolitis Mother   . Pneumonia Mother   . Heart attack Father   . Aortic dissection Sister   . AAA (abdominal aortic aneurysm) Other        3 OTHER SIBLINGS DIED FOR THIS  . Other Other        MOTOR VEHICLE ACC  . Cancer - Lung Son        11/2011  . Cancer - Prostate Son        01/17/2013  . Fibromyalgia Daughter   . Healthy Grandchild        GRE[employer] GRAND CHILDREN   Allergies  Allergen Reactions  . Benadryl [Diphenhydramine Hcl (Sleep)] Other (See Comments)    Paradoxical response, hyperactivity  . Diphenhydramine Hcl Other (See Comments)    Paradoxical response, hyperactivity  . Penicillins Hives  . Quinolones     Patient was warned about not using Cipro and similar antibiotics. Recent studies have raised concern that fluoroquinolone antibiotics could be associated with an increased risk of aortic aneurysm Fluoroquinolones have non-antimicrobial properties that might jeopardise the integrity of the extracellular matrix of the vascular wall In a  propensity score matched cohort study in [place], there was a 66% increased rate of aortic aneurysm or dissection associated with oral fluoroquinolone use, compared wit  . Sulfacetamide Sodium Nausea And Vomiting  . Penicillin G Rash  . Sulfa Antibiotics Nausea And Vomiting and Rash   Prior to Admission medications   Medication Sig Start Date End Date Taking? Authorizing Provider  acetaminophen (TYLENOL) 500 MG  tablet Take 500-1,000 mg by mouth as needed for moderate pain.    Yes [provider]  amLODipine (NORVASC) 2.5 MG tablet Take 2.5 mg by mouth daily.   Yes [provider]  baclofen (LIORESAL) 10 MG tablet Take 5 mg by mouth as needed for muscle spasms.   Yes [provider]  BIOTIN PO Take by mouth.   Yes [provider]  Calcium-Magnesium-Vitamin D (CALCIUM 1200+D3 PO) Take 1 tablet by mouth 2 (two) times daily.   Yes [provider]  carvedilol (COREG) 12.5 MG tablet TAKE 1 TABLET (12.5 MG TOTAL) BY MOUTH 2 (TWO) TIMES DAILY. 10/27/18 10/22/19 Yes Nahser, Wonda Cheng, MD  doxazosin (CARDURA) 2 MG tablet Take 2 mg by mouth daily.    Yes [provider]  estradiol (ESTRACE VAGINAL) 0.1 MG/GM vaginal cream Place 1 Applicatorful vaginally 3 (three) times a week.   Yes [provider]  loperamide (IMODIUM) 2 MG capsule Take by mouth as needed for diarrhea or loose stools.   Yes [provider]  Multiple Vitamins-Minerals (CENTRUM SILVER PO) Take 1 tablet by mouth daily.   Yes [provider]  Multiple Vitamins-Minerals (PRESERVISION AREDS 2 PO) Take 1 tablet by mouth 2 (two) times daily.   Yes [provider]  olmesartan (BENICAR) 40 MG tablet TAKE 1 TABLET BY MOUTH EVERY DAY **TO REPLACE IRBESARTAN** 07/22/17  Yes [provider]  Polyethyl Glycol-Propyl Glycol (SYSTANE OP) Apply 1 drop to eye every morning.   Yes [provider]  potassium chloride SA (KLOR-CON) 20 MEQ tablet Take 20 mEq by mouth 2 (two) times daily.   Yes [provider]  psyllium (METAMUCIL) 58.6 % powder Take 1 packet by mouth at bedtime as needed (for constipation).    Yes [provider]  simethicone (MYLICON) 80 MG chewable tablet Chew 1 tablet by mouth daily.   Yes [provider]  sodium chloride (OCEAN) 0.65 % SOLN nasal spray Place 1-2 sprays into both nostrils every morning.   Yes [provider]  triamterene-hydrochlorothiazide (MAXZIDE-25) 37.5-25 MG tablet Take 1 tablet by mouth daily.  04/22/15  Yes [provider]     Positive ROS: Otherwise negative  All other systems have been reviewed and were otherwise negative with the exception of those mentioned in the HPI and as above.  Physical Exam: Constitutional: Alert, well-appearing, no acute distress Ears: External ears without lesions or tenderness. Ear canals she has large amount of cerumen in the left side and minimal cerumen on the right side.. Nasal: External nose without lesions. Clear  nasal passages Oral: Oropharynx clear. Neck: No palpable adenopathy or masses Respiratory: Breathing comfortably  Skin: No facial/neck lesions or rash noted.  Cerumen impaction removal  Date/Time: 01/10/2019 5:17 PM Performed by: Drema Halon, MD Authorized by: Drema Halon, MD   Consent:    Consent obtained:  Verbal   Consent given by:  Patient   Risks discussed:  Pain and bleeding Procedure details:    Location:  L ear and R ear   Procedure type: suction   Post-procedure details:    Inspection:  TM intact and canal normal   Hearing quality:  Improved   Patient tolerance of procedure:  Tolerated well, no immediate complications    Assessment: Cerumen impaction  Plan: She will follow-up as needed  Narda Bonds, MD

## 2019-01-10 NOTE — Telephone Encounter (Signed)
Patient is returning call in regards to Echo results.

## 2019-02-07 ENCOUNTER — Ambulatory Visit: Payer: Medicare HMO | Attending: Internal Medicine

## 2019-02-07 DIAGNOSIS — Z23 Encounter for immunization: Secondary | ICD-10-CM

## 2019-02-07 NOTE — Progress Notes (Signed)
   Covid-19 Vaccination Clinic  Name:  LOTA LEAMER    MRN: [id number] DOB: Mar 23, 1930  02/07/2019  Ms. Chriswell was observed post Covid-19 immunization for 15 minutes without incidence. She was provided with Vaccine Information Sheet and instruction to access the V-Safe system.   Ms. Benda was instructed to call 911 with any severe reactions post vaccine: Marland Kitchen Difficulty breathing  . Swelling of your face and throat  . A fast heartbeat  . A bad rash all over your body  . Dizziness and weakness    Immunizations Administered    Name Date Dose VIS Date Route   Pfizer COVID-19 Vaccine 02/07/2019  4:14 PM 0.3 mL 12/30/2018 Intramuscular   Manufacturer: [employer], [employer]   Lot: [id number]   NDC: [address]-6

## 2019-02-21 DIAGNOSIS — M8580 Other specified disorders of bone density and structure, unspecified site: Secondary | ICD-10-CM | POA: Diagnosis not present

## 2019-02-23 DIAGNOSIS — Z961 Presence of intraocular lens: Secondary | ICD-10-CM | POA: Diagnosis not present

## 2019-02-23 DIAGNOSIS — H40013 Open angle with borderline findings, low risk, bilateral: Secondary | ICD-10-CM | POA: Diagnosis not present

## 2019-02-26 ENCOUNTER — Ambulatory Visit: Payer: Medicare HMO | Attending: Internal Medicine

## 2019-02-26 DIAGNOSIS — Z23 Encounter for immunization: Secondary | ICD-10-CM

## 2019-02-26 NOTE — Progress Notes (Signed)
   Covid-19 Vaccination Clinic  Name:  Donna Callahan    MRN: [id number] DOB: [birth year]  02/26/2019  [name] was observed post Covid-19 immunization for 15 minutes without incidence. She was provided with Vaccine Information Sheet and instruction to access the V-Safe system.   [name] was instructed to call 911 with any severe reactions post vaccine: Marland Kitchen Difficulty breathing  . Swelling of your face and throat  . A fast heartbeat  . A bad rash all over your body  . Dizziness and weakness    Immunizations Administered    Name Date Dose VIS Date Route   Pfizer COVID-19 Vaccine 02/26/2019 12:47 PM 0.3 mL 12/30/2018 Intramuscular   Manufacturer: [employer], [employer]   Lot: [id number]   NDC: [address]-7

## 2019-03-23 DIAGNOSIS — L570 Actinic keratosis: Secondary | ICD-10-CM | POA: Diagnosis not present

## 2019-03-23 DIAGNOSIS — L821 Other seborrheic keratosis: Secondary | ICD-10-CM | POA: Diagnosis not present

## 2019-03-24 ENCOUNTER — Other Ambulatory Visit: Payer: Self-pay | Admitting: Cardiovascular Disease

## 2019-04-06 ENCOUNTER — Other Ambulatory Visit: Payer: Self-pay | Admitting: Cardiovascular Disease

## 2019-04-07 MED ORDER — AMLODIPINE BESYLATE 2.5 MG PO TABS: 2.5000 mg | ORAL_TABLET | Freq: Every day | ORAL | 2 refills | Status: DC

## 2019-04-12 ENCOUNTER — Other Ambulatory Visit: Payer: Self-pay

## 2019-04-12 ENCOUNTER — Ambulatory Visit (INDEPENDENT_AMBULATORY_CARE_PROVIDER_SITE_OTHER): Payer: Medicare HMO | Admitting: Cardiovascular Disease

## 2019-04-12 ENCOUNTER — Encounter: Payer: Self-pay | Admitting: Cardiovascular Disease

## 2019-04-12 VITALS — BP 144/66 | HR 72 | Ht 63.0 in | Wt 129.2 lb

## 2019-04-12 DIAGNOSIS — I5032 Chronic diastolic (congestive) heart failure: Secondary | ICD-10-CM

## 2019-04-12 DIAGNOSIS — I351 Nonrheumatic aortic (valve) insufficiency: Secondary | ICD-10-CM

## 2019-04-12 MED ORDER — AMLODIPINE BESYLATE 5 MG PO TABS: 5.0000 mg | ORAL_TABLET | Freq: Every day | ORAL | 3 refills | Status: DC

## 2019-04-12 NOTE — Patient Instructions (Signed)
Medication Instructions:  No changes --we sent a prescription for correct dose of amlodipine to your pharmacy --5 mg daily *If you need a refill on your cardiac medications before your next appointment, please call your pharmacy*   Lab Work: none If you have labs (blood work) drawn today and your tests are completely normal, you will receive your results only by: Marland Kitchen MyChart Message (if you have MyChart) OR . A paper copy in the mail If you have any lab test that is abnormal or we need to change your treatment, we will call you to review the results.   Testing/Procedures: none   Follow-Up: At [facility], you and your health needs are our priority.  As part of our continuing mission to provide you with exceptional heart care, we have created designated Provider Care Teams.  These Care Teams include your primary Cardiologist (physician) and Advanced Practice Providers (APPs -  Physician Assistants and Nurse Practitioners) who all work together to provide you with the care you need, when you need it.  Your next appointment:   12 month(s)  The format for your next appointment:   In Person  Provider:   You may see Kristeen Miss, MD or one of the following Advanced Practice Providers on your designated Care Team:    Tereso Newcomer, PA-C  [place], [place]  Berton Bon, NP    Other Instructions

## 2019-04-12 NOTE — Progress Notes (Signed)
Cardiology Office Note:    Date:  04/12/2019   ID:  Donna Callahan, DOB [birth year], MRN [id number]  PCP:  Crist Infante, MD  Cardiologist:  Mertie Moores, MD    Referring MD: Crist Infante, MD   Chief Complaint  Patient presents with  . Hypertension    Donna Callahan is a 86 y.o. female with a hx of hypertension, hyperlipidemia, chronic kidney disease stage II, who was seen by Robbie Lis, PA and me in Sept. For DOE  BP was elevated at that time.    Having lots of GI issues.   Breathing is better.  Gives out when she walks any distance.    Thinks is due to back pain  Is watching her diet   Echo shows normal systolic function with grade 1 diastolic dysfunction.  She has moderate aortic insufficiency with a moderately dilated aorta.  August 17, 2017:  Having some back pain  Unable to walk - has lots of fatigue .   Leg weakness.  No cp or chest tightness or fullness.  Has mod. - severe dilitation of her ascending aorta ( 4.8 cm)  She reports having a brother and sister die of ruptured aortic aneurism   November 17, 2017: Donna Callahan is seen today.  She has a history of diastolic congestive heart failure.  She has a ascending aortic aneurysm.  She has seen Dr. Servando Snare.  She is not interested in any surgical intervention.  BP is a bit high today  Advised her to avoid salty foods   February 17, 2018: Donna Callahan is seen today for follow-up of her hypertension, chronic diastolic congestive heart failure, and  thoracic aortic aneurysm. BP is a little elevated today.  Her blood pressure readings from home indicate an occasional high blood pressure but also several normal/low blood pressure readings.  Her blood pressure ranges anywhere from 381 systolic up to 017 systolic on her home readings.  She tries to avoid eating any extra salt.   October 03, 2018: Donna Callahan is seen today for follow-up of her hypertension, chronic diastolic congestive heart failure and thoracic aortic aneurysm.  She has  mild aortic insufficiency. Is generally fatigues , especially with exertion   Has a 4.8 cm ascending aortic aneurism.  Followed by Dr. Servando Snare.   April 12, 2019:  Donna Callahan is seen today for follow-up of her hypertension, chronic diastolic congestive heart failure and thoracic aortic aneurysm.  She also has mild aortic insufficiency.   Sees Dr. Kathyrn Lass yearly for her thoracic aneurimsal aneurism   Brought her BP log.   Some readings are elevated.  Some are low    Past Medical History:  Diagnosis Date  . CKD (chronic kidney disease) stage 2, GFR 60-89 ml/min   . Hard of hearing   . High cholesterol   . Hypertension   . Iron deficiency anemia   . Varicose veins     Past Surgical History:  Procedure Laterality Date  . stab phlebectomy Right 04/27/2016   stab phlebectomy right leg by Tinnie Gens MD   . stab phlebectomy  Left 05/11/2016   stab phlebectomy > 20 incisions left leg by Tinnie Gens MD   . STAPEDECTOMY     bil ears  . TONSILLECTOMY    . TOTAL KNEE ARTHROPLASTY Right 05/02/2014   Procedure: RIGHT TOTAL KNEE ARTHROPLASTY;  Surgeon: Gaynelle Arabian, MD;  Location: [facility];  Service: Orthopedics;  Laterality: Right;  Marland Kitchen VARICOSE VEIN SURGERY      Current Medications: Current  Meds  Medication Sig  . acetaminophen (TYLENOL) 500 MG tablet Take 500-1,000 mg by mouth as needed for moderate pain.   . baclofen (LIORESAL) 10 MG tablet Take 5 mg by mouth as needed for muscle spasms.  Marland Kitchen BIOTIN PO Take by mouth.  . Calcium-Magnesium-Vitamin D (CALCIUM 1200+D3 PO) Take 1 tablet by mouth 2 (two) times daily.  . carvedilol (COREG) 12.5 MG tablet TAKE 1 TABLET (12.5 MG TOTAL) BY MOUTH 2 (TWO) TIMES DAILY.  Marland Kitchen diltiazem (TIAZAC) 240 MG 24 hr capsule Take 1 capsule by mouth 2 (two) times daily.  Marland Kitchen doxazosin (CARDURA) 2 MG tablet Take 2 mg by mouth daily.   Marland Kitchen estradiol (ESTRACE VAGINAL) 0.1 MG/GM vaginal cream Place 1 Applicatorful vaginally 3 (three) times a week.  . loperamide (IMODIUM) 2  MG capsule Take by mouth as needed for diarrhea or loose stools.  . Multiple Vitamins-Minerals (PRESERVISION AREDS 2 PO) Take 1 tablet by mouth 2 (two) times daily.  Marland Kitchen olmesartan (BENICAR) 40 MG tablet TAKE 1 TABLET BY MOUTH EVERY DAY **TO REPLACE IRBESARTAN**  . Polyethyl Glycol-Propyl Glycol (SYSTANE OP) Apply 1 drop to eye every morning.  . potassium chloride SA (KLOR-CON) 20 MEQ tablet Take 20 mEq by mouth 2 (two) times daily.  . psyllium (METAMUCIL) 58.6 % powder Take 1 packet by mouth at bedtime as needed (for constipation).   . simethicone (MYLICON) 80 MG chewable tablet Chew 1 tablet by mouth daily.  . sodium chloride (OCEAN) 0.65 % SOLN nasal spray Place 1-2 sprays into both nostrils every morning.  . triamterene-hydrochlorothiazide (MAXZIDE-25) 37.5-25 MG tablet Take 1 tablet by mouth daily.   . [DISCONTINUED] amLODipine (NORVASC) 2.5 MG tablet Take 1 tablet (2.5 mg total) by mouth daily.     Allergies:   Benadryl [diphenhydramine hcl (sleep)], Diphenhydramine hcl, Penicillins, Quinolones, Sulfacetamide sodium, Penicillin g, and Sulfa antibiotics   Social History   Socioeconomic History  . Marital status: Widowed    Spouse name: Not on file  . Number of children: Not on file  . Years of education: Not on file  . Highest education level: Not on file  Occupational History  . Not on file  Tobacco Use  . Smoking status: Never Smoker  . Smokeless tobacco: Never Used  Substance and Sexual Activity  . Alcohol use: No  . Drug use: No  . Sexual activity: Never    Comment: HUSBAND DIED 05/08/04  Other Topics Concern  . Not on file  Social History Narrative  . Not on file   Social Determinants of Health   Financial Resource Strain:   . Difficulty of Paying Living Expenses:   Food Insecurity:   . Worried About Programme researcher, broadcasting/film/video in the Last Year:   . Barista in the Last Year:   Transportation Needs:   . Freight forwarder (Medical):   Marland Kitchen Lack of Transportation  (Non-Medical):   Physical Activity:   . Days of Exercise per Week:   . Minutes of Exercise per Session:   Stress:   . Feeling of Stress :   Social Connections:   . Frequency of Communication with Friends and Family:   . Frequency of Social Gatherings with Friends and Family:   . Attends Religious Services:   . Active Member of Clubs or Organizations:   . Attends Banker Meetings:   Marland Kitchen Marital Status:      Family History: The patient's family history includes AAA (abdominal aortic aneurysm) in an other  family member; Aortic dissection in her sister; Bronchiolitis in her mother; Cancer - Lung in her son; Cancer - Prostate in her son; Fibromyalgia in her daughter; Healthy in her grandchild; Heart attack in her father; Hypertension in her mother; Other in an other family member; Pneumonia in her mother. ROS:   Please see the history of present illness.     All other systems reviewed and are negative.  EKGs/Labs/Other Studies Reviewed:    The following studies were reviewed today:     Recent Labs: No results found for requested labs within last 8760 hours.  Recent Lipid Panel No results found for: CHOL, TRIG, HDL, CHOLHDL, VLDL, LDLCALC, LDLDIRECT  Physical Exam:    Physical Exam: Blood pressure (!) 144/66, pulse 72, height 5\' 3"  (1.6 m), weight 129 lb 4 oz (58.6 kg), SpO2 95 %.  GEN:  Well nourished, well developed in no acute distress HEENT: Normal NECK: No JVD; No carotid bruits LYMPHATICS: No lymphadenopathy CARDIAC: RRR , no murmurs, rubs, gallops RESPIRATORY:  Clear to auscultation without rales, wheezing or rhonchi  ABDOMEN: Soft, non-tender, non-distended MUSCULOSKELETAL:  No edema; No deformity  SKIN: Warm and dry NEUROLOGIC:  Alert and oriented x 3   EKG:          ASSESSMENT:    No diagnosis found. PLAN:     1. Chronic diastolic congestive heart failure:     Stable   2.  Ascending aortic aneurysm:    Stable.  Followed by Dr. - he  will see her again in sept.   2.  Aortic insufficiency:     Stable   3.  HTN:   There apparently was some confusion about her amlodipine dose.  Our records show that she was on amlodipine 2.5 but in fact she has been taking amlodipine 5 mg a day.  She recently picked up a refill that was for amlodipine 2.5 mg a day but her blood pressure readings on 5 mg look better.  We will increase her amlodipine back up to 5 mg a day.   Medication Adjustments/Labs and Tests Ordered: Current medicines are reviewed at length with the patient today.  Concerns regarding medicines are outlined above.  No orders of the defined types were placed in this encounter.  Meds ordered this encounter  Medications  . amLODipine (NORVASC) 5 MG tablet    Sig: Take 1 tablet (5 mg total) by mouth daily.    Dispense:  90 tablet    Refill:  3     . Signed, Tyrone Sage, MD  04/12/2019 5:16 PM    [facility]

## 2019-04-27 DIAGNOSIS — N814 Uterovaginal prolapse, unspecified: Secondary | ICD-10-CM | POA: Diagnosis not present

## 2019-04-27 DIAGNOSIS — N8111 Cystocele, midline: Secondary | ICD-10-CM | POA: Diagnosis not present

## 2019-04-27 DIAGNOSIS — N952 Postmenopausal atrophic vaginitis: Secondary | ICD-10-CM | POA: Diagnosis not present

## 2019-05-12 ENCOUNTER — Other Ambulatory Visit: Payer: Self-pay

## 2019-05-12 DIAGNOSIS — D509 Iron deficiency anemia, unspecified: Secondary | ICD-10-CM | POA: Diagnosis not present

## 2019-05-12 DIAGNOSIS — I10 Essential (primary) hypertension: Secondary | ICD-10-CM | POA: Diagnosis not present

## 2019-05-12 DIAGNOSIS — K580 Irritable bowel syndrome with diarrhea: Secondary | ICD-10-CM | POA: Diagnosis not present

## 2019-05-12 DIAGNOSIS — I129 Hypertensive chronic kidney disease with stage 1 through stage 4 chronic kidney disease, or unspecified chronic kidney disease: Secondary | ICD-10-CM | POA: Diagnosis not present

## 2019-05-12 DIAGNOSIS — N182 Chronic kidney disease, stage 2 (mild): Secondary | ICD-10-CM | POA: Diagnosis not present

## 2019-05-12 DIAGNOSIS — I700 Atherosclerosis of aorta: Secondary | ICD-10-CM | POA: Diagnosis not present

## 2019-05-12 DIAGNOSIS — M542 Cervicalgia: Secondary | ICD-10-CM | POA: Diagnosis not present

## 2019-05-12 DIAGNOSIS — F329 Major depressive disorder, single episode, unspecified: Secondary | ICD-10-CM | POA: Diagnosis not present

## 2019-05-12 DIAGNOSIS — I519 Heart disease, unspecified: Secondary | ICD-10-CM | POA: Diagnosis not present

## 2019-05-12 MED ORDER — AMLODIPINE BESYLATE 5 MG PO TABS: 5.0000 mg | ORAL_TABLET | Freq: Every day | ORAL | 3 refills | Status: DC

## 2019-05-16 ENCOUNTER — Other Ambulatory Visit: Payer: Self-pay

## 2019-05-16 MED ORDER — AMLODIPINE BESYLATE 5 MG PO TABS: 5.0000 mg | ORAL_TABLET | Freq: Every day | ORAL | 3 refills | Status: DC

## 2019-09-13 ENCOUNTER — Other Ambulatory Visit: Payer: Self-pay | Admitting: *Deleted

## 2019-09-13 DIAGNOSIS — I712 Thoracic aortic aneurysm, without rupture, unspecified: Secondary | ICD-10-CM

## 2019-10-04 DIAGNOSIS — M160 Bilateral primary osteoarthritis of hip: Secondary | ICD-10-CM | POA: Diagnosis not present

## 2019-10-04 DIAGNOSIS — M545 Low back pain: Secondary | ICD-10-CM | POA: Diagnosis not present

## 2019-10-04 DIAGNOSIS — S46011A Strain of muscle(s) and tendon(s) of the rotator cuff of right shoulder, initial encounter: Secondary | ICD-10-CM | POA: Diagnosis not present

## 2019-10-19 ENCOUNTER — Ambulatory Visit (INDEPENDENT_AMBULATORY_CARE_PROVIDER_SITE_OTHER): Payer: Medicare HMO | Admitting: Cardiothoracic Surgery

## 2019-10-19 ENCOUNTER — Other Ambulatory Visit: Payer: Self-pay

## 2019-10-19 ENCOUNTER — Ambulatory Visit
Admission: RE | Admit: 2019-10-19 | Discharge: 2019-10-19 | Disposition: A | Discharge status: Home / Self Care | Payer: Medicare HMO | Source: Ambulatory Visit | Attending: Cardiothoracic Surgery | Admitting: Cardiothoracic Surgery

## 2019-10-19 ENCOUNTER — Encounter: Payer: Self-pay | Admitting: Cardiothoracic Surgery

## 2019-10-19 VITALS — BP 166/85 | HR 64 | Temp 97.0°F | Resp 20 | Ht 63.0 in | Wt 125.0 lb

## 2019-10-19 DIAGNOSIS — I712 Thoracic aortic aneurysm, without rupture, unspecified: Secondary | ICD-10-CM

## 2019-10-19 DIAGNOSIS — I700 Atherosclerosis of aorta: Secondary | ICD-10-CM | POA: Diagnosis not present

## 2019-10-19 DIAGNOSIS — I2510 Atherosclerotic heart disease of native coronary artery without angina pectoris: Secondary | ICD-10-CM | POA: Diagnosis not present

## 2019-10-19 DIAGNOSIS — J8489 Other specified interstitial pulmonary diseases: Secondary | ICD-10-CM | POA: Diagnosis not present

## 2019-10-19 MED ORDER — IOPAMIDOL (ISOVUE-370) INJECTION 76%
75.0000 mL | Freq: Once | INTRAVENOUS | Status: AC | PRN
  Administered 2019-10-19: 75 mL via INTRAVENOUS

## 2019-10-19 NOTE — Progress Notes (Signed)
[address]       [place] [address]             [phone]                    Donna Callahan [facility] Medical Record #[id number] Date of Birth: Aug 30, 1930  Referring: Nahser, Deloris Ping, MD Primary Care: Rodrigo Ran, MD Primary Cardiologist: Kristeen Miss, MD  Chief Complaint:    Chief Complaint  Patient presents with  . Thoracic Aortic Aneurysm    yearly f/u with CTA chest 10/19/19    History of Present Illness:    Donna Callahan 86 y.o. female is seen in the office in follow up    for dilated ascending aorta.  She denies any change in symptoms, has chronic back pain.  No angina.    The patient is followed by Dr. Elease Hashimoto.  Was first seen by me in August 2019.  At that time she was being evaluated by Dr. Roderic Scarce for increasing shortness of breath and echocardiogram was done the patient was referred because of a 4.7 cm dilated ascending aorta.  Subsequently follow-up CT scans been done in 2020 and now 2021   The patient has a positive family history of aneurysm disease her sister was followed with an ascending aorta dilatation and died of acute aortic dissection in 2008 at age 44. Patient's father died suddenly at age 28 in the 62s, she has one nephew currently followed in our office for a dilated ascending aorta.   Currently she is without shortness of breath ,heart failure, and notes her blood pressures been better controlled after increasing her blood pressure medications.  [place] and does some light work around her house.   Current Activity/ Functional Status:  Patient is independent with mobility/ambulation, transfers, ADL's, IADL's.   Zubrod Score: At the time of surgery this patient's most appropriate activity status/level should be described as: []     0    Normal activity, no symptoms [x]     1    Restricted in physical strenuous activity but ambulatory, able to do out light work []     2    Ambulatory and  capable of self care, unable to do work activities, up and about               >50 % of waking hours                              []     3    Only limited self care, in bed greater than 50% of waking hours []     4    Completely disabled, no self care, confined to bed or chair []     5    Moribund   Past Medical History:  Diagnosis Date  . CKD (chronic kidney disease) stage 2, GFR 60-89 ml/min   . Hard of hearing   . High cholesterol   . Hypertension   . Iron deficiency anemia   . Varicose veins     Past Surgical History:  Procedure Laterality Date  . stab phlebectomy Right 04/27/2016   stab phlebectomy right leg by Josephina Gip MD   . stab phlebectomy  Left 05/11/2016   stab phlebectomy > 20 incisions left leg by Josephina Gip MD   . STAPEDECTOMY     bil ears  . TONSILLECTOMY    .  TOTAL KNEE ARTHROPLASTY Right 05/02/2014   Procedure: RIGHT TOTAL KNEE ARTHROPLASTY;  Surgeon: Ollen Gross, MD;  Location: [facility];  Service: Orthopedics;  Laterality: Right;  Marland Kitchen VARICOSE VEIN SURGERY      Family History  Problem Relation Age of Onset  . Hypertension Mother   . Bronchiolitis Mother   . Pneumonia Mother   . Heart attack Father   . Aortic dissection Sister   . AAA (abdominal aortic aneurysm) Other        3 OTHER SIBLINGS DIED FOR THIS  . Other Other        MOTOR VEHICLE ACC  . Cancer - Lung Son        11/2011  . Cancer - Prostate Son        01/17/2013  . Fibromyalgia Daughter   . Healthy Grandchild        GREAT GRAND CHILDREN   Patient's nephew [name] is followed for dilated  ascending aorta and has had previous coronary artery bypass grafting  Patient's sister [name] was followed with a dilated ascending aorta by Dr. Lavinia Sharps and died at age 41 with a acute aortic dissection.  Her father died at age 16 of sudden death in 87.   Social History   Tobacco Use  Smoking Status Never Smoker  Smokeless Tobacco Never Used    Social History   Substance and Sexual  Activity  Alcohol Use No     Allergies  Allergen Reactions  . Benadryl [Diphenhydramine Hcl (Sleep)] Other (See Comments)    Paradoxical response, hyperactivity  . Diphenhydramine Hcl Other (See Comments)    Paradoxical response, hyperactivity  . Penicillins Hives  . Quinolones     Patient was warned about not using Cipro and similar antibiotics. Recent studies have raised concern that fluoroquinolone antibiotics could be associated with an increased risk of aortic aneurysm Fluoroquinolones have non-antimicrobial properties that might jeopardise the integrity of the extracellular matrix of the vascular wall In a  propensity score matched cohort study in [place], there was a 66% increased rate of aortic aneurysm or dissection associated with oral fluoroquinolone use, compared wit  . Sulfacetamide Sodium Nausea And Vomiting  . Penicillin G Rash  . Sulfa Antibiotics Nausea And Vomiting and Rash    Current Outpatient Medications  Medication Sig Dispense Refill  . acetaminophen (TYLENOL) 500 MG tablet Take 500-1,000 mg by mouth as needed for moderate pain.     Marland Kitchen amLODipine (NORVASC) 5 MG tablet Take 1 tablet (5 mg total) by mouth daily. 90 tablet 3  . baclofen (LIORESAL) 10 MG tablet Take 5 mg by mouth as needed for muscle spasms.    Marland Kitchen BIOTIN PO Take by mouth.    . carvedilol (COREG) 12.5 MG tablet TAKE 1 TABLET (12.5 MG TOTAL) BY MOUTH 2 (TWO) TIMES DAILY. 180 tablet 3  . doxazosin (CARDURA) 2 MG tablet Take 2 mg by mouth daily.     Marland Kitchen estradiol (ESTRACE VAGINAL) 0.1 MG/GM vaginal cream Place 1 Applicatorful vaginally 3 (three) times a week.    . loperamide (IMODIUM) 2 MG capsule Take by mouth as needed for diarrhea or loose stools.    . Multiple Vitamins-Minerals (PRESERVISION AREDS 2 PO) Take 1 tablet by mouth 2 (two) times daily.    Marland Kitchen olmesartan (BENICAR) 40 MG tablet TAKE 1 TABLET BY MOUTH EVERY DAY **TO REPLACE IRBESARTAN**  3  . Polyethyl Glycol-Propyl Glycol (SYSTANE OP) Apply 1  drop to eye every morning.    . potassium chloride  SA (KLOR-CON) 20 MEQ tablet Take 20 mEq by mouth 2 (two) times daily.    . psyllium (METAMUCIL) 58.6 % powder Take 1 packet by mouth at bedtime as needed (for constipation).     . sodium chloride (OCEAN) 0.65 % SOLN nasal spray Place 1-2 sprays into both nostrils every morning.    . triamterene-hydrochlorothiazide (MAXZIDE-25) 37.5-25 MG tablet Take 1 tablet by mouth daily.     . Calcium-Magnesium-Vitamin D (CALCIUM 1200+D3 PO) Take 1 tablet by mouth 2 (two) times daily. (Patient not taking: Reported on 10/19/2019)    . diltiazem (TIAZAC) 240 MG 24 hr capsule Take 1 capsule by mouth 2 (two) times daily. (Patient not taking: Reported on 10/19/2019)    . simethicone (MYLICON) 80 MG chewable tablet Chew 1 tablet by mouth daily. (Patient not taking: Reported on 10/19/2019)     No current facility-administered medications for this visit.    Pertinent items are noted in HPI.   Review of Systems:     Cardiac Review of Systems: [Y] = yes  or   [ N ] = no   Chest Pain [ N  ]  Resting SOB [  N] Exertional SOB  [  Y]  Orthopnea Klaus.Mock ]   Pedal Edema [ N ]    Palpitations [N] Syncope  Klaus.Mock  ]   Presyncope [ N  ]   General Review of Systems: [Y] = yes [  ]=no Constitional: recent weight change [  ];  Wt loss over the last 3 months [   ] anorexia [  ]; fatigue [  ]; nausea [  ]; night sweats [  ]; fever [  ]; or chills [  ];           Eye : blurred vision [  ]; diplopia [   ]; vision changes [  ];  Amaurosis fugax[  ]; Resp: cough [  ];  wheezing[  ];  hemoptysis[  ]; shortness of breath[  ]; paroxysmal nocturnal dyspnea[  ]; dyspnea on exertion[  ]; or orthopnea[  ];  GI:  gallstones[  ], vomiting[  ];  dysphagia[  ]; melena[  ];  hematochezia [  ]; heartburn[  ];   Hx of  Colonoscopy[  ]; GU: kidney stones [  ]; hematuria[  ];   dysuria [ y ];  nocturia[  ];  history of     obstruction [  ]; urinary frequency [  ]             Skin: rash, swelling[  ];, hair  loss[  ];  peripheral edema[  ];  or itching[  ]; Musculosketetal: myalgias[  ];  joint swelling[  ];  joint erythema[  ];  joint pain[  ];  back pain[  ];  Heme/Lymph: bruising[  ];  bleeding[  ];  anemia[  ];  Neuro: TIA[  ];  headaches[  ];  stroke[  ];  vertigo[  ];  seizures[  ];   paresthesias[  ];  difficulty walking[ y ];  Psych:depression[  ]; anxiety[  ];  Endocrine: diabetes[  ];  thyroid dysfunction[  ];  Immunizations: Flu up to date [ y ]; Pneumococcal up to date Cove.Etienne  ];  Other:covid 19 [ Y]    PHYSICAL EXAMINATION: BP (!) 166/85 (BP Location: Left Arm, Patient Position: Sitting, Cuff Size: Normal)   Pulse 64   Temp (!) 97 F (36.1 C)   Resp 20   Ht 5'  3" (1.6 m)   Wt 125 lb (56.7 kg)   SpO2 94% Comment: RA  BMI 22.14 kg/m  General appearance: alert, cooperative, appears stated age and no distress Head: Normocephalic, without obvious abnormality, atraumatic Neck: no adenopathy, no carotid bruit, no JVD, supple, symmetrical, trachea midline and thyroid not enlarged, symmetric, no tenderness/mass/nodules Lymph nodes: Cervical, supraclavicular, and axillary nodes normal. Resp: clear to auscultation bilaterally Cardio: regular rate and rhythm, S1, S2 normal, no murmur, click, rub or gallop GI: soft, non-tender; bowel sounds normal; no masses,  no organomegaly Extremities: extremities normal, atraumatic, no cyanosis or edema Neurologic: Grossly normal  Diagnostic Studies & Laboratory data:     Recent Radiology Findings:  CT ANGIO CHEST AORTA W/CM & OR WO/CM  Result Date: 10/19/2019 CLINICAL DATA:  Thoracic aortic aneurysm, follow-up, currently asymptomatic. EXAM: CT ANGIOGRAPHY CHEST WITH CONTRAST TECHNIQUE: Multidetector CT imaging of the chest was performed using the standard protocol during bolus administration of intravenous contrast. Multiplanar CT image reconstructions and MIPs were obtained to evaluate the vascular anatomy. CONTRAST:  75mL ISOVUE-370 IOPAMIDOL  (ISOVUE-370) INJECTION 76% COMPARISON:  10/06/2018 and previous FINDINGS: Cardiovascular: Borderline cardiomegaly. No pericardial effusion. Fair contrast opacification of the pulmonary arterial tree; the exam was not optimized for detection of pulmonary emboli. Coronary calcifications. Good contrast opacification of the thoracic aorta. No dissection or stenosis. Aortic Root: --Valve: 2.8 cm --Sinuses: 4.2 cm --Sinotubular Junction: 3.4 cm Limitations by motion: Moderate Thoracic Aorta: --Ascending Aorta: 4.8 cm (stable) --Aortic Arch: 3.5 cm --Descending Aorta: 3.1 Classic 3 vessel brachiocephalic arterial origin anatomy without proximal stenosis. The brachiocephalic vessels are tortuous. Scattered calcified plaque in the visualized proximal abdominal aorta. Mediastinum/Nodes: No mass or adenopathy. Lungs/Pleura: No pleural effusion. No pneumothorax. Linear scarring or subsegmental atelectasis the left lung base as before. Peripheral interstitial opacities in both lower lobes have slightly increased since prior study. 0.7 cm subpleural right middle lobe nodule (Im6,Se103) , stable since 08/27/2017. No new nodules. Upper Abdomen: No acute findings. Musculoskeletal: Bilateral shoulder DJD. Thoracolumbar levoscoliosis apex L1 without evident underlying vertebral anomaly. Spondylitic changes in the visualized lower cervical spine. Negative for fracture or other acute bone abnormality. Review of the MIP images confirms the above findings. IMPRESSION: 1. Stable 4.8 cm ascending thoracic aortic aneurysm without complicating features. Recommend semi-annual imaging followup by CTA or MRA. This recommendation follows 2010 ACCF/AHA/AATS/ACR/ASA/SCA/SCAI/SIR/STS/SVM Guidelines for the Diagnosis and Management of Patients With Thoracic Aortic Disease. Circulation. 2010; 121: G956-O130 2. Coronary and  aortic Atherosclerosis (ICD10-I70.0). Electronically Signed   By: Corlis Leak M.D.   On: 10/19/2019 15:30   Ct Angio Chest Aorta  W &/or Wo Contrast  Result Date: 10/06/2018 CLINICAL DATA:  Follow-up TAA EXAM: CT ANGIOGRAPHY CHEST WITH CONTRAST TECHNIQUE: Multidetector CT imaging of the chest was performed using the standard protocol during bolus administration of intravenous contrast. Multiplanar CT image reconstructions and MIPs were obtained to evaluate the vascular anatomy. CONTRAST:  75mL ISOVUE-370 IOPAMIDOL (ISOVUE-370) INJECTION 76% COMPARISON:  03/24/2018, 08/27/2017 FINDINGS: Cardiovascular: Preferential opacification of the thoracic aorta. Unchanged enlargement of the tubular ascending thoracic aorta, measuring 4.8 x 4.8 cm. The aortic valve measures 2.2 cm and the sinuses of Valsalva measure 4.1 cm. Normal caliber of the mildly tortuous descending thoracic aorta. Mixed aortic atherosclerosis. Normal heart size. Left coronary artery calcifications. No pericardial effusion. Mediastinum/Nodes: No enlarged mediastinal, hilar, or axillary lymph nodes. Calcified 1.2 cm nodule of the left thyroid, unchanged. Trachea, and esophagus demonstrate no significant findings. Lungs/Pleura: Bandlike scarring of the dependent left lung. Stable,  benign 6 mm pulmonary nodule of the right middle lobe (series 7, image 94). No pleural effusion or pneumothorax. Upper Abdomen: No acute abnormality. Musculoskeletal: No chest wall abnormality. No acute or significant osseous findings. Review of the MIP images confirms the above findings. IMPRESSION: 1. Unchanged enlargement of the tubular ascending thoracic aorta, measuring 4.8 x 4.8 cm. The aortic valve measures 2.2 cm and the sinuses of Valsalva measure 4.1 cm. Normal caliber of the mildly tortuous descending thoracic aorta. 2. Aortic Atherosclerosis (ICD10-I70.0). Aortic aneurysm NOS (ICD10-I71.9). 3.  Coronary artery disease. Electronically Signed   By: Lauralyn Primes M.D.   On: 10/06/2018 11:55   Ct Angio Chest Aorta W &/or Wo Contrast  Result Date: 03/24/2018 CLINICAL DATA:  Follow-up thoracic  aortic aneurysm EXAM: CT ANGIOGRAPHY CHEST WITH CONTRAST TECHNIQUE: Multidetector CT imaging of the chest was performed using the standard protocol during bolus administration of intravenous contrast. Multiplanar CT image reconstructions and MIPs were obtained to evaluate the vascular anatomy. CONTRAST:  75mL ISOVUE-370 IOPAMIDOL (ISOVUE-370) INJECTION 76% Creatinine was obtained on site at [facility] at [address] Results: Creatinine 0.8 mg/dL. COMPARISON:  08/27/2017 FINDINGS: Cardiovascular: Thoracic aorta again demonstrates atherosclerotic calcifications without evidence of dissection. There is fusiform dilatation of the ascending aorta measuring 4.8 cm in greatest dimension on the axial images at the level of the pulmonary artery. This is stable in appearance when compared with the prior exam. Normal tapering in the thoracic aortic arch is noted. No aneurysmal dilatation in the descending thoracic aorta is noted. The origins of the brachiocephalic vessels are within normal limits. Mild coronary calcifications are seen. The heart is at the upper limits of normal in size. No central pulmonary embolus is seen. The venous drainage from the left occurs via a hypertrophied superior intercostal vein stable from the previous exam. Mediastinum/Nodes: Scattered hypodensities are again identified in the thyroid with a focal calcification on the left. No sizable hilar or mediastinal adenopathy is noted. The esophagus as visualized is within normal limits. Lungs/Pleura: Lungs are well aerated bilaterally. Mild scarring is noted in the left lower lobe. Mild lingular scarring is noted as well. Stable from the prior exam. No focal infiltrate or sizable effusion is noted. No nodular changes are seen. Upper Abdomen: Visualized upper abdomen shows no acute abnormality. Musculoskeletal: Degenerative changes of the thoracic spine are noted. No acute bony abnormality is seen. Review of the MIP images confirms the  above findings. IMPRESSION: Stable ascending thoracic aortic aneurysm. Recommend semi-annual imaging followup by CTA or MRA and referral to cardiothoracic surgery if not already obtained. This recommendation follows 2010 ACCF/AHA/AATS/ACR/ASA/SCA/SCAI/SIR/STS/SVM Guidelines for the Diagnosis and Management of Patients With Thoracic Aortic Disease. Circulation. 2010; 121: G295-M841. Aortic aneurysm NOS (ICD10-I71.9) Aortic Atherosclerosis (ICD10-I70.0). Electronically Signed   By: Alcide Clever M.D.   On: 03/24/2018 10:07    Ct Angio Chest Aorta W &/or Wo Contrast  Result Date: 08/27/2017 CLINICAL DATA:  Follow-up thoracic aortic aneurysm. EXAM: CT ANGIOGRAPHY CHEST WITH CONTRAST TECHNIQUE: Multidetector CT imaging of the chest was performed using the standard protocol during bolus administration of intravenous contrast. Multiplanar CT image reconstructions and MIPs were obtained to evaluate the vascular anatomy. CONTRAST:  ISOVUE-370 IOPAMIDOL (ISOVUE-370) INJECTION 76% COMPARISON:  CT abdomen pelvis-07/23/2014 FINDINGS: Vascular Findings: Fusiform aneurysmal dilatation of the ascending thoracic aorta was measurements as follows. The thoracic aorta tapers to a normal caliber at the level of the aortic arch. The descending thoracic aorta is mildly tortuous but of normal caliber. No  evidence of thoracic aortic dissection or periaortic stranding on this nongated examination. Conventional configuration of the aortic arch. The branch vessels of the aortic arch appear widely patent throughout their imaged course. Cardiomegaly. Coronary artery calcifications. Small amount of fluid is seen within the pericardial recess. No pericardial effusion. Although this examination was not tailored for the evaluation the pulmonary arteries, there are no discrete filling defects within the central pulmonary arterial tree to suggest central pulmonary embolism. Normal caliber the main pulmonary artery. Incidentally noted absence  of the left innominate vein with development of a hypertrophied left superior intercostal vein which drains into the distal SVC - while nonspecific, this is likely congenital in etiology given lack of associated additional hypertrophied mediastinal venous collaterals. ------------------------------------------------------------- Thoracic aortic measurements: Sinotubular junction 41 mm as measured in greatest oblique coronal dimension. Proximal ascending aorta 48 mm as measured in greatest oblique axial dimension at the level of the main pulmonary artery (image 51, series 4) and approximately 50 mm in greatest oblique short axis coronal diameter (coronal image 34, series 7). Aortic arch aorta 29 mm as measured in greatest oblique sagittal dimension. Proximal descending thoracic aorta 27 mm as measured in greatest oblique axial dimension at the level of the main pulmonary artery. Distal descending thoracic aorta 27 mm as measured in greatest oblique axial dimension at the level of the diaphragmatic hiatus. Review of the MIP images confirms the above findings. ------------------------------------------------------------- Non-Vascular Findings: Mediastinum/Lymph Nodes: No bulky mediastinal, hilar axillary lymphadenopathy. Lungs/Pleura: Minimal dependent subpleural ground-glass atelectasis. Minimal subsegmental atelectasis within the left lower lobe. No discrete focal airspace opacities. No pleural effusion or pneumothorax. No discrete pulmonary nodules. The central pulmonary airways appear widely patent. Upper abdomen: Limited early arterial phase evaluation of the upper abdomen demonstrates enlargement of the caliber of the central aspect of the pancreatic duct measuring approximately 5 mm in diameter (image 109, series 4, similar to remote abdominal CT performed 07/2014. Musculoskeletal: No acute or aggressive osseous abnormalities. Moderate rotatory scoliotic curvature of the thoracolumbar spine with dominant mid  component convex to the left with associated moderate severe DDD within the central component of the concavity. Regional soft tissues appear normal. Mildly heterogeneous appearing thyroid gland. Note is made of an approximately 0.5 cm dystrophic calcification within the left lobe of the thyroid. IMPRESSION: 1. Uncomplicated fusiform aneurysmal dilatation of the ascending thoracic aorta measuring 50 mm in diameter. Aortic aneurysm NOS (ICD10-I71.9). 2. Coronary artery calcifications. Aortic Atherosclerosis (ICD10-I70.0). 3. 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 Electronically Signed   By: Simonne Come M.D.   On: 08/27/2017 10:26     I have independently reviewed the above radiology studies  and reviewed the findings with the patient.   ECHO 08/23/2017: Transthoracic Echocardiography  Patient:    Alyne, Martinson MR #:       [id number] Study Date: 08/23/2017 Gender:     F Age:        87 Height:     160 cm Weight:     60.2 kg BSA:        1.64 m^2 Pt. Status: Room:   REFERRING    Kristeen Miss, M.D.  ATTENDING    Olga Millers  SONOGRAPHER  Clearence Ped, RCS  PERFORMING   [facility]  ORDERING     Nahser, Jr  REFERRING    Nahser, Jr  cc:  ------------------------------------------------------------------- LV EF: 50% -  55%  ------------------------------------------------------------------- Indications:      Thoracic Aortic Aneurysm without rupture (T06).   ------------------------------------------------------------------- History:   PMH:  CKD, HLD. old echo 9/18 ascending aorta 47mm, mod ai and there was no effusion oppose to some effusion on this study. HLD. CKD.  Risk factors:  Hypertension.  ------------------------------------------------------------------- Study Conclusions  - Left ventricle: The cavity size was normal. There was  mild focal   basal hypertrophy of the septum. Systolic function was normal.   The estimated ejection fraction was in the range of 50% to 55%.   Wall motion was normal; there were no regional wall motion   abnormalities. Doppler parameters are consistent with abnormal   left ventricular relaxation (grade 1 diastolic dysfunction). - Aortic valve: There was mild regurgitation. - Mitral valve: There was mild regurgitation. - Left atrium: The atrium was moderately dilated. - Pericardium, extracardiac: A small pericardial effusion was   identified.  Impressions:  - Normal LV systolic function; mild diastolic dysfunction; mild AI;   severely dilated ascending aorta (4.9 cm; suggest CTA to further   assess); mild MR; moderate LAE; small pericardial effusion.  ------------------------------------------------------------------- Study data:  Comparison was made to the study of 10/13/2016.  Study status:  Routine.  Procedure:  The patient reported no pain pre or post test. Transthoracic echocardiography. Image quality was adequate.          Transthoracic echocardiography.  M-mode, complete 2D, spectral Doppler, and color Doppler.  Birthdate: Patient birthdate: [birth year].  Age:  Patient is 87 yr old.  Sex: Gender: female.    BMI: 23.5 kg/m^2.  Blood pressure:     120/62 Patient status:  Outpatient.  Study date:  Study date: 08/23/2017. Study time: 07:49 AM.  Location:  [facility]  -------------------------------------------------------------------  ------------------------------------------------------------------- Left ventricle:  The cavity size was normal. There was mild focal basal hypertrophy of the septum. Systolic function was normal. The estimated ejection fraction was in the range of 50% to 55%. Wall motion was normal; there were no regional wall motion abnormalities. Doppler parameters are consistent with abnormal left ventricular relaxation (grade 1 diastolic  dysfunction).  ------------------------------------------------------------------- Aortic valve:   Trileaflet; normal thickness leaflets. Mobility was not restricted.  Doppler:  Transvalvular velocity was within the normal range. There was no stenosis. There was mild regurgitation.   ------------------------------------------------------------------- Aorta:  Aortic root: The aortic root was normal in size. Ascending aorta: The ascending aorta was severely dilated.  ------------------------------------------------------------------- Mitral valve:   Structurally normal valve.   Mobility was not restricted.  Doppler:  Transvalvular velocity was within the normal range. There was no evidence for stenosis. There was mild regurgitation.  ------------------------------------------------------------------- Left atrium:  The atrium was moderately dilated.  ------------------------------------------------------------------- Right ventricle:  The cavity size was normal. Systolic function was normal.  ------------------------------------------------------------------- Pulmonic valve:    Doppler:  Transvalvular velocity was within the normal range. There was no evidence for stenosis.  ------------------------------------------------------------------- Tricuspid valve:   Structurally normal valve.    Doppler: Transvalvular velocity was within the normal range. There was trivial regurgitation.  ------------------------------------------------------------------- Pulmonary artery:   Systolic pressure was within the normal range.   ------------------------------------------------------------------- Right atrium:  The atrium was normal in size.  ------------------------------------------------------------------- Pericardium:  A small pericardial effusion was identified.  ------------------------------------------------------------------- Systemic veins: Inferior vena cava: The  vessel was normal in size. The respirophasic diameter changes were in the normal range (= 50%), consistent with normal central venous pressure. Diameter: 16 mm.  ------------------------------------------------------------------- Measurements   IVC  Value        Reference  ID                                         16    mm     ----------    Left ventricle                             Value        Reference  LV ID, ED, PLAX chordal                    50.2  mm     43 - 52  LV ID, ES, PLAX chordal                    36    mm     23 - 38  LV fx shortening, PLAX chordal     (L)     28    %      >=29  LV PW thickness, ED                        10.22 mm     ----------  IVS/LV PW ratio, ED                (H)     1.31         <=1.3  Stroke volume, 2D                          104   ml     ----------  Stroke volume/bsa, 2D                      63    ml/m^2 ----------  LV e&', lateral                             9.36  cm/s   ----------  LV E/e&', lateral                           5.6          ----------  LV e&', medial                              3.81  cm/s   ----------  LV E/e&', medial                            13.75        ----------  LV e&', average                             6.59  cm/s   ----------  LV E/e&', average                           7.96         ----------    Ventricular septum  Value        Reference  IVS thickness, ED                          13.43 mm     ----------    LVOT                                       Value        Reference  LVOT ID, S                                 22    mm     ----------  LVOT area                                  3.8   cm^2   ----------  LVOT peak velocity, S                      104   cm/s   ----------  LVOT mean velocity, S                      72.7  cm/s   ----------  LVOT VTI, S                                27.3  cm     ----------    Aortic valve                                Value        Reference  Aortic regurg pressure half-time           497   ms     ----------    Aorta                                      Value        Reference  Aortic root ID, ED                         37    mm     ----------    Left atrium                                Value        Reference  LA ID, A-P, ES                             28    mm     ----------  LA ID/bsa, A-P                             1.7   cm/m^2 <=2.2  LA volume, S  61.7  ml     ----------  LA volume/bsa, S                           37.5  ml/m^2 ----------  LA volume, ES, 1-p A4C                     77.4  ml     ----------  LA volume/bsa, ES, 1-p A4C                 47.1  ml/m^2 ----------  LA volume, ES, 1-p A2C                     49.6  ml     ----------  LA volume/bsa, ES, 1-p A2C                 30.2  ml/m^2 ----------    Mitral valve                               Value        Reference  Mitral E-wave peak velocity                52.4  cm/s   ----------  Mitral A-wave peak velocity                109   cm/s   ----------  Mitral deceleration time           (H)     232   ms     150 - 230  Mitral E/A ratio, peak                     0.5          ----------    Pulmonary arteries                         Value        Reference  PA pressure, S, DP                         25    mm Hg  <=30    Tricuspid valve                            Value        Reference  Tricuspid regurg peak velocity             236   cm/s   ----------  Tricuspid peak RV-RA gradient              22    mm Hg  ----------  Tricuspid maximal regurg velocity,         236   cm/s   ----------  PISA    Right atrium                               Value        Reference  RA ID, S-I, ES, A4C                (H)     49.4  mm     34 - 49  RA area, ES, A4C  12.7  cm^2   8.3 - 19.5  RA volume, ES, A/L                         26.4  ml     ----------  RA volume/bsa, ES, A/L                     16.1  ml/m^2  ----------    Systemic veins                             Value        Reference  Estimated CVP                              3     mm Hg  ----------    Right ventricle                            Value        Reference  TAPSE                                      21.7  mm     ----------  RV pressure, S, DP                         25    mm Hg  <=30  RV s&', lateral, S                          11.2  cm/s   ----------  Legend: (L)  and  (H)  mark values outside specified reference range.  ------------------------------------------------------------------- Prepared and Electronically Authenticated by  Olga Millers 2019-08-05T15:13:31   Recent Lab Findings: Lab Results  Component Value Date   WBC 5.7 07/26/2014   HGB 10.0 (L) 07/26/2014   HCT 30.8 (L) 07/26/2014   PLT 217 07/26/2014   GLUCOSE 111 (H) 08/17/2017   ALT 17 07/24/2014   AST 17 07/24/2014   NA 134 08/17/2017   K 4.6 08/17/2017   CL 96 08/17/2017   CREATININE 0.76 08/17/2017   BUN 14 08/17/2017   CO2 21 08/17/2017   INR 1.05 07/23/2014   Aortic Size Index=     4.8    /Body surface area is 1.59 meters squared. =2.9  < 2.75 cm/m2      4% risk per year 2.75 to 4.25          8% risk per year > 4.25 cm/m2    20% risk per year  Aortic Cross section area/ Height ratio=   Chronic Kidney Disease   Stage I     GFR >90  Stage II    GFR 60-89  Stage IIIA GFR 45-59  Stage IIIB GFR 30-44  Stage IV   GFR 15-29  Stage V    GFR  <15  Lab Results  Component Value Date   CREATININE 0.76 08/17/2017   CrCl cannot be calculated (Patient's most recent lab result is older than the maximum 21 days allowed.).  Assessment / Plan:   1/  Unchanged enlargement of the tubular ascending thoracic aorta, measuring 4.8 x 4.8 cm.  Normal caliber of  the mildly tortuous descending thoracic aorta.  Patient is an 86year-old female with a positive family history of aortic dissection with a 4.8cm dilated aorta and a relatively small body  size.  Follow-up scan today shows the size of her aorta remains stable.  I reviewed with the patient a CT scan with diagnosis of dilated ascending aorta along with the risk of dissection. At  age 20 she says she is not interested in any major surgical interventions but would like to follow the size of her aorta to know if there are any changes.  We will plan to see her back with a CTA of the chest in 12 months.  She was cautioned about strenuous lifting, need for good blood pressure control, and to avoid use of quinolones.  She does note she has a living will enforce.    Follow-up scan today shows sizes remain stable.  Greer Ee MD      [address] [place] [address] Office [phone]   Beeper [phone]  10/22/2019 5:43 PM

## 2019-11-01 DIAGNOSIS — S46011D Strain of muscle(s) and tendon(s) of the rotator cuff of right shoulder, subsequent encounter: Secondary | ICD-10-CM | POA: Diagnosis not present

## 2019-11-01 DIAGNOSIS — M160 Bilateral primary osteoarthritis of hip: Secondary | ICD-10-CM | POA: Diagnosis not present

## 2019-11-21 DIAGNOSIS — E785 Hyperlipidemia, unspecified: Secondary | ICD-10-CM | POA: Diagnosis not present

## 2019-11-21 DIAGNOSIS — M859 Disorder of bone density and structure, unspecified: Secondary | ICD-10-CM | POA: Diagnosis not present

## 2019-11-28 DIAGNOSIS — E785 Hyperlipidemia, unspecified: Secondary | ICD-10-CM | POA: Diagnosis not present

## 2019-11-28 DIAGNOSIS — I519 Heart disease, unspecified: Secondary | ICD-10-CM | POA: Diagnosis not present

## 2019-11-28 DIAGNOSIS — R809 Proteinuria, unspecified: Secondary | ICD-10-CM | POA: Diagnosis not present

## 2019-11-28 DIAGNOSIS — R82998 Other abnormal findings in urine: Secondary | ICD-10-CM | POA: Diagnosis not present

## 2019-11-28 DIAGNOSIS — N182 Chronic kidney disease, stage 2 (mild): Secondary | ICD-10-CM | POA: Diagnosis not present

## 2019-11-28 DIAGNOSIS — I700 Atherosclerosis of aorta: Secondary | ICD-10-CM | POA: Diagnosis not present

## 2019-11-28 DIAGNOSIS — Z0000 Encounter for general adult medical examination without abnormal findings: Secondary | ICD-10-CM | POA: Diagnosis not present

## 2019-11-28 DIAGNOSIS — I129 Hypertensive chronic kidney disease with stage 1 through stage 4 chronic kidney disease, or unspecified chronic kidney disease: Secondary | ICD-10-CM | POA: Diagnosis not present

## 2019-11-28 DIAGNOSIS — M8580 Other specified disorders of bone density and structure, unspecified site: Secondary | ICD-10-CM | POA: Diagnosis not present

## 2019-11-28 DIAGNOSIS — I8310 Varicose veins of unspecified lower extremity with inflammation: Secondary | ICD-10-CM | POA: Diagnosis not present

## 2019-12-15 DIAGNOSIS — N390 Urinary tract infection, site not specified: Secondary | ICD-10-CM | POA: Diagnosis not present

## 2019-12-15 DIAGNOSIS — R300 Dysuria: Secondary | ICD-10-CM | POA: Diagnosis not present

## 2020-01-11 DIAGNOSIS — Z1231 Encounter for screening mammogram for malignant neoplasm of breast: Secondary | ICD-10-CM | POA: Diagnosis not present

## 2020-01-29 DIAGNOSIS — R922 Inconclusive mammogram: Secondary | ICD-10-CM | POA: Diagnosis not present

## 2020-01-29 DIAGNOSIS — R928 Other abnormal and inconclusive findings on diagnostic imaging of breast: Secondary | ICD-10-CM | POA: Diagnosis not present

## 2020-03-12 DIAGNOSIS — M25519 Pain in unspecified shoulder: Secondary | ICD-10-CM | POA: Diagnosis not present

## 2020-03-12 DIAGNOSIS — M419 Scoliosis, unspecified: Secondary | ICD-10-CM | POA: Diagnosis not present

## 2020-03-12 DIAGNOSIS — M25552 Pain in left hip: Secondary | ICD-10-CM | POA: Diagnosis not present

## 2020-03-12 DIAGNOSIS — R5383 Other fatigue: Secondary | ICD-10-CM | POA: Diagnosis not present

## 2020-03-26 NOTE — Progress Notes (Signed)
Office Visit Note  Patient: Donna Callahan             Date of Birth: 85-25-1932           MRN: [id number]             PCP: Crist Infante, MD Referring: Crist Infante, MD Visit Date: 03/27/2020   Subjective:  New Patient (Initial Visit) (Patient complains of bilateral shoulder, bilateral hip (right>left), and bilateral hand pain (right>left). )   History of Present Illness: Donna Callahan is a 87 y.o. female with a history of HFpEF, osteopenia, chronic shoulder pain, scoliosis, thoracic aortic aneurysm, previous anemia from lower GI bleeding here for evaluation of increased joint pains and elevated CRP. She was generally feeling well without chronic arthritis complaints until several months ago when she fairly abruptly developed pain and stiffness involving her shoulders, hips, and hands bilaterally and in some cases diffuse pain all over. This is most severe in the morning and takes hours to improve each day. As an example she finds putting on a shirt in the morning takes up to 5 minutes due to the shoulder stiffness and pain. This is bothering her a great deal and limiting her activities. Sometimes she has numbness going down to the hand but most of the time just pain and stiffness. She does not see much swelling and no rashes or redness. She does not take NSAIDs due to comorbidities and history of GI bleed. She takes tylenol 3 times daily with mild benefit. She was treated with prednisone twice, she felt no big improvement the first time and then a more than 50% improvement in symptoms the second time. She is now off any steroid for about a month and feeling just as badly as this began.   Labs reviewed Uric acid 4.1 ESR 22 CRP 29 CCP neg RF neg ANA neg  Activities of Daily Living:  Patient reports morning stiffness for several hours.   Patient Reports nocturnal pain.  Difficulty dressing/grooming: Reports Difficulty climbing stairs: Reports Difficulty getting out of chair:  Reports Difficulty using hands for taps, buttons, cutlery, and/or writing: Reports  Review of Systems  Constitutional: Positive for fatigue.  HENT: Positive for hearing loss. Negative for mouth sores, mouth dryness and nose dryness.   Eyes: Positive for dryness. Negative for pain, itching and visual disturbance.  Respiratory: Negative for cough, hemoptysis, shortness of breath and difficulty breathing.   Cardiovascular: Negative for chest pain, palpitations and swelling in legs/feet.  Gastrointestinal: Positive for diarrhea. Negative for abdominal pain, blood in stool and constipation.  Endocrine: Negative for increased urination.  Genitourinary: Negative for painful urination.  Musculoskeletal: Positive for arthralgias, joint pain, morning stiffness and muscle tenderness. Negative for joint swelling, myalgias, muscle weakness and myalgias.  Skin: Negative for color change, rash and redness.  Allergic/Immunologic: Negative for susceptible to infections.  Neurological: Positive for parasthesias. Negative for dizziness, headaches, memory loss and weakness.  Hematological: Negative for swollen glands.  Psychiatric/Behavioral: Negative for confusion and sleep disturbance.    PMFS History:  Patient Active Problem List   Diagnosis Date Noted  . Irritable bowel syndrome with diarrhea 03/27/2020  . Laryngopharyngeal reflux 03/27/2020  . Oropharyngeal dysphagia 03/27/2020  . Elevated C-reactive protein (CRP) 03/27/2020  . Bilateral hand pain 03/27/2020  . Aortic insufficiency 04/12/2019  . Bilateral hip pain 12/10/2017  . Bilateral shoulder pain 12/10/2017  . Scoliosis 12/10/2017  . Chronic diastolic CHF (congestive heart failure) ([place]) 12/07/2016  . Varicose veins of left  lower extremity with complications 86/57/8469  . Mallet deformity of left little finger 02/23/2015  . Lower GI bleeding 07/26/2014  . Rectal bleeding 07/23/2014  . Hypertension 07/23/2014  . Acute blood loss anemia  07/23/2014  . Hyperlipidemia 07/23/2014  . Rectal bleed 07/23/2014  . OA (osteoarthritis) of knee 05/02/2014  . Varicose veins of right lower extremity with pain     Past Medical History:  Diagnosis Date  . CKD (chronic kidney disease) stage 2, GFR 60-89 ml/min   . Hard of hearing   . High cholesterol   . Hypertension   . Iron deficiency anemia   . Scoliosis   . Varicose veins     Family History  Problem Relation Age of Onset  . Hypertension Mother   . Bronchiolitis Mother   . Pneumonia Mother   . Heart attack Father   . Pneumonia Sister   . Aortic dissection Sister   . AAA (abdominal aortic aneurysm) Other        3 OTHER SIBLINGS DIED FOR THIS  . Other Other        MOTOR VEHICLE ACC  . Cancer - Lung Son        11/2011  . Pancreatic cancer Son   . Fibromyalgia Daughter   . Healthy Grandchild        [employer] GRAND CHILDREN  . Heart attack Maternal Grandfather    Past Surgical History:  Procedure Laterality Date  . stab phlebectomy Right 04/27/2016   stab phlebectomy right leg by Tinnie Gens MD   . stab phlebectomy  Left 05/11/2016   stab phlebectomy > 20 incisions left leg by Tinnie Gens MD   . STAPEDECTOMY     bil ears  . TONSILLECTOMY    . TOTAL KNEE ARTHROPLASTY Right 05/02/2014   Procedure: RIGHT TOTAL KNEE ARTHROPLASTY;  Surgeon: Gaynelle Arabian, MD;  Location: [facility];  Service: Orthopedics;  Laterality: Right;  Marland Kitchen VARICOSE VEIN SURGERY     Social History   Social History Narrative  . Not on file   Immunization History  Administered Date(s) Administered  . Influenza, High Dose Seasonal PF 10/18/2016  . Influenza-Unspecified 10/14/2016  . PFIZER(Purple Top)SARS-COV-2 Vaccination 02/07/2019, 02/26/2019, 10/22/2019  . Tdap 04/08/2014  . Zoster Recombinat (Shingrix) 10/21/2017, 12/21/2017     Objective: Vital Signs: BP (!) 148/84 (BP Location: Right Arm, Patient Position: Sitting, Cuff Size: Normal)   Pulse 73   Ht 5' 1.5" (1.562 m)   Wt 125 lb (56.7 kg)    BMI 23.24 kg/m    Physical Exam HENT:     Right Ear: External ear normal.     Left Ear: External ear normal.     Mouth/Throat:     Mouth: Mucous membranes are dry.  Eyes:     Conjunctiva/sclera: Conjunctivae normal.  Cardiovascular:     Rate and Rhythm: Normal rate and regular rhythm.  Pulmonary:     Effort: Pulmonary effort is normal.     Breath sounds: Normal breath sounds.  Skin:    General: Skin is warm and dry.     Findings: No rash.  Neurological:     General: No focal deficit present.     Mental Status: She is alert.  Psychiatric:        Mood and Affect: Mood normal.     Musculoskeletal Exam:  Neck full ROM, tightness with lateral rotations Shoulders able to raise overhead with discomfort, pain radiating towards elbows with active abduction and resisted abduction Elbows full ROM no  tenderness or swelling Wrists full ROM no tenderness or swelling Fingers full ROM, squaring of 1st CMC joints, partial subluxation and ulnar deviation of right 2-3rd MCP joints without palpable synovitis, fixed flexion deformity of left 5th digit Hip rotation tolerated but FABER maneuver provokes anterior hip pain on both sides, and lateral hips are tender to palpation Knees full ROM patellofemoral crepitus of the right knee  Ankles full ROM no tenderness or swelling MTPs no squeeze tenderness  Investigation: No additional findings.  Imaging: XR Hand 2 View Left  Result Date: 03/27/2020 Xray left hand 2 views Radiocarpal joint space probable radial aspect narrowing. Calcification of the TFCC. Mild 1st CMC arthritis without much subluxation. Slight subluxation of 3rd MCP otherwise joint spaces preserved. Narrowing of 3rd DIP space, significantly worse degenerative change in the 5th digit. Severe generalized osteopenia is present. Impression Mild or moderate degenerative joint changes, more advanced in the 5th digit, although 3rd MCP subluxation, osteopenia, and TFCC calcification could  reflect inflammatory disease or CPDD  XR Hand 2 View Right  Result Date: 03/27/2020 Xray right hand 2 views Severe narrowing of radiocarpal joint space. Calcification of the TFCC with preservation of ulnar styloid. Moderate degenerative disease of first CMC joint and partial subluxation. Subluxation and narrowing of 2-3rd MCP joints. Asymmetric narrowing and osteophytes present at multiple DIP joints. Severe generalized osteopenia. Impression Joint changes appear consistent with longstanding osteoarthritis although bone demineralization, MCP subluxation, and TFCC calcifications could reflect inflammatory disease or CPDD   Recent Labs: Lab Results  Component Value Date   WBC 5.7 07/26/2014   HGB 10.0 (L) 07/26/2014   PLT 217 07/26/2014   NA 134 08/17/2017   K 4.6 08/17/2017   CL 96 08/17/2017   CO2 21 08/17/2017   GLUCOSE 111 (H) 08/17/2017   BUN 14 08/17/2017   CREATININE 0.76 08/17/2017   BILITOT 0.5 07/24/2014   ALKPHOS 66 07/24/2014   AST 17 07/24/2014   ALT 17 07/24/2014   PROT 6.0 (L) 07/24/2014   ALBUMIN 3.4 (L) 07/24/2014   CALCIUM 9.8 08/17/2017   GFRAA 83 08/17/2017    Speciality Comments: No specialty comments available.  Procedures:  No procedures performed Allergies: Benadryl [diphenhydramine hcl (sleep)], Diphenhydramine hcl, Penicillins, Quinolones, Sulfacetamide sodium, Penicillin g, and Sulfa antibiotics   Assessment / Plan:     Visit Diagnoses: Elevated C-reactive protein (CRP) - Plan: Sedimentation rate, C-reactive protein  Increased CRP associated with worsening joint pain and stiffness is suggestive for inflammatory arthritis. I do not see any overt synovitis on exam. Negative serology for RA and symptoms are more diffuse than typically seen for PMR. Will repeat inflammatory markers ESR, CRP again today for any trend or progression.  Lower GI bleeding  She has a history of lower GI bleed and anemia so would recommend against NSAID treatment option,  although risk is shared with glucocorticoids.  Primary osteoarthritis of right knee  Right knee OA but this is longstanding without too much exacerbation or at least no swelling currently.  Bilateral hand pain - Plan: XR Hand 2 View Left, XR Hand 2 View Right  She has hand pain and swelling with hours of morning stiffness but I do not see any synovitis on physical exam today. There is some MCP joint subluxation. Will obtain xrays of bilateral hands for erosive disease changes. Discussed we can consider ultrasound exam of joints at follow up if no obvious cause is found before then.  Chronic pain of both shoulders  Bilateral shoulder pain sounds more  consistent with rotator cuff type pathology than severe glenohumeral joint arthritis by location and history.  Bilateral hip pain  Bilateral hip pain localizes to the anterior and groin but also has lateral hip tenderness so probably a mixed picture of hip OA and also some tendinopathy process.  Orders: Orders Placed This Encounter  Procedures  . XR Hand 2 View Left  . XR Hand 2 View Right  . Sedimentation rate  . C-reactive protein   No orders of the defined types were placed in this encounter.   Follow-Up Instructions: Return in about 2 weeks (around 04/10/2020) for New pt f/u.   Collier Salina, MD  Note - This record has been created using [employer].  Chart creation errors have been sought, but may not always  have been located. Such creation errors do not reflect on  the standard of medical care.

## 2020-03-27 ENCOUNTER — Encounter: Payer: Self-pay | Admitting: Internal Medicine

## 2020-03-27 ENCOUNTER — Ambulatory Visit: Payer: Self-pay

## 2020-03-27 ENCOUNTER — Ambulatory Visit: Payer: Medicare HMO | Admitting: Internal Medicine

## 2020-03-27 ENCOUNTER — Other Ambulatory Visit: Payer: Self-pay

## 2020-03-27 VITALS — BP 148/84 | HR 73 | Ht 61.5 in | Wt 125.0 lb

## 2020-03-27 DIAGNOSIS — M25512 Pain in left shoulder: Secondary | ICD-10-CM

## 2020-03-27 DIAGNOSIS — M79641 Pain in right hand: Secondary | ICD-10-CM | POA: Diagnosis not present

## 2020-03-27 DIAGNOSIS — M1711 Unilateral primary osteoarthritis, right knee: Secondary | ICD-10-CM

## 2020-03-27 DIAGNOSIS — K922 Gastrointestinal hemorrhage, unspecified: Secondary | ICD-10-CM | POA: Diagnosis not present

## 2020-03-27 DIAGNOSIS — M25551 Pain in right hip: Secondary | ICD-10-CM

## 2020-03-27 DIAGNOSIS — G8929 Other chronic pain: Secondary | ICD-10-CM | POA: Diagnosis not present

## 2020-03-27 DIAGNOSIS — M25511 Pain in right shoulder: Secondary | ICD-10-CM

## 2020-03-27 DIAGNOSIS — K219 Gastro-esophageal reflux disease without esophagitis: Secondary | ICD-10-CM | POA: Insufficient documentation

## 2020-03-27 DIAGNOSIS — R1312 Dysphagia, oropharyngeal phase: Secondary | ICD-10-CM | POA: Insufficient documentation

## 2020-03-27 DIAGNOSIS — M25552 Pain in left hip: Secondary | ICD-10-CM | POA: Diagnosis not present

## 2020-03-27 DIAGNOSIS — R7982 Elevated C-reactive protein (CRP): Secondary | ICD-10-CM | POA: Diagnosis not present

## 2020-03-27 DIAGNOSIS — K580 Irritable bowel syndrome with diarrhea: Secondary | ICD-10-CM | POA: Insufficient documentation

## 2020-03-27 DIAGNOSIS — M79642 Pain in left hand: Secondary | ICD-10-CM | POA: Insufficient documentation

## 2020-03-27 NOTE — Patient Instructions (Addendum)
I am checking for any evidence of an inflammatory condition causing the current increase in joint pains and in your abnormal markers for inflammation checked in your primary care clinic. I am checking hand xrays for any bony changes that look different than expected for usual, degenerative type wear and tear changes.   Erythrocyte Sedimentation Rate Test Why am I having this test? The erythrocyte sedimentation rate (ESR) test is used to help find illnesses related to:  Sudden (acute) or long-term (chronic) infections.  Inflammation.  The body's disease-fighting system attacking healthy cells (autoimmune diseases).  Cancer.  Tissue death. If you have symptoms that may be related to any of these illnesses, your health care provider may do an ESR test before doing more specific tests. If you have an inflammatory immune disease, such as rheumatoid arthritis, you may have this test to help monitor your therapy. What is being tested? This test measures how long it takes for your red blood cells (erythrocytes) to settle in a solution over a certain amount of time (sedimentation rate). When you have an infection or inflammation, your red blood cells clump together and settle faster. The sedimentation rate provides information about how much inflammation is present in the body. What kind of sample is taken? A blood sample is required for this test. It is usually collected by inserting a needle into a blood vessel.   How do I prepare for this test? Follow any instructions from your health care provider about changing or stopping your regular medicines. Tell a health care provider about:  Any allergies you have.  All medicines you are taking, including vitamins, herbs, eye drops, creams, and over-the-counter medicines.  Any blood disorders you have.  Any surgeries you have had.  Any medical conditions you have, such as thyroid or kidney disease.  Whether you are pregnant or may be  pregnant. How are the results reported? Your results will be reported as a value that measures sedimentation rate in millimeters per hour (mm/hr). Your health care provider will compare your results to normal ranges that were established after testing a large group of people (reference values). Reference values may vary among labs and hospitals. For this test, common reference values, which vary by age and gender, are:  Newborn: 0-2 mm/hr.  Child, up to puberty: 0-10 mm/hr.  Female: ? Under 50 years: 0-20 mm/hr. ? 50-85 years: 0-30 mm/hr. ? Over 85 years: 0-42 mm/hr.  Female: ? Under 50 years: 0-15 mm/hr. ? 50-85 years: 0-20 mm/hr. ? Over 85 years: 0-30 mm/hr. Certain conditions or medicines may cause ESR levels to be falsely lower or higher, such as:  Pregnancy.  Obesity.  Steroids, birth control pills, and blood thinners.  Thyroid or kidney disease. What do the results mean? Results that are within reference values are considered normal, meaning that the level of inflammation in your body is healthy. High ESR levels mean that there is inflammation in your body. You will have more tests to help make a diagnosis. Inflammation may result from many different conditions or injuries. Talk with your health care provider about what your results mean. Questions to ask your health care provider Ask your health care provider, or the department that is doing the test:  When will my results be ready?  How will I get my results?  What are my treatment options?  What other tests do I need?  What are my next steps? Summary  The erythrocyte sedimentation rate (ESR) test is used to help find  illnesses associated with sudden (acute) or long-term (chronic) infections, inflammation, autoimmune diseases, cancer, or tissue death.  If you have symptoms that may be related to any of these illnesses, your health care provider may do an ESR test before doing more specific tests. If you have an  inflammatory immune disease, such as rheumatoid arthritis, you may have this test to help monitor your therapy.  This test measures how long it takes for your red blood cells (erythrocytes) to settle in a solution over a certain amount of time (sedimentation rate). This provides information about how much inflammation is present in the body.    C-Reactive Protein Test Why am I having this test? The C-reactive protein (CRP) is a substance that the liver releases in response to inflammation within the body. You may have a CRP test to help diagnose:  Serious bacterial or fungal infections.  Inflammatory diseases, such as inflammatory bowel disease.  Lupus, rheumatoid arthritis, or other autoimmune diseases. What is being tested? This test checks for the level of CRP in your blood. The level of CRP in your body increases greatly after a heart attack, infection, or injury. What kind of sample is taken? A blood sample is required for this test. It is usually collected by inserting a needle into a blood vessel.   Tell a health care provider about:  All medicines you are taking, including vitamins, herbs, eye drops, creams, and over-the-counter medicines.  Any medical conditions you have.  The use of birth control pills or hormone replacement therapy such as estrogen.  Any blood disorders you have.  Whether you are pregnant or may be pregnant. How are the results reported? Your test results will be reported as a value that indicates how much CRP is in your blood. This will be reported as milligrams of CRP per liter (mg/L) of blood. Your health care provider will compare your results to normal ranges that were established after testing a large group of people (reference ranges). Reference ranges may vary among labs and hospitals. For this test, the standard CRP reference value is less than 10 mg/L. What do the results mean? A standard CRP test result that is less than 10 mg/L is considered  normal, meaning that you do not have an abnormally high level of inflammation in your body. A standard CRP test result that is greater than 10 mg/L means that there is an abnormally high level of inflammation in your body that is causing CRP to be released. Inflammation may result from many different conditions or injuries. You will have more tests to help make a diagnosis. Talk with your health care provider about what your results mean. Questions to ask your health care provider Ask your health care provider, or the department that is doing the test:  When will my results be ready?  How will I get my results?  What are my treatment options?  What other tests do I need?  What are my next steps? Summary  C-reactive protein (CPR) is a substance released by the liver in response to inflammation within the body.  The CRP test may be performed to help diagnose infection and inflammatory conditions.  Talk with your health care provider about what your results mean. This information is not intended to replace advice given to you by your health care provider. Make sure you discuss any questions you have with your health care provider. Document Revised: 11/01/2019 Document Reviewed: 11/01/2019 Elsevier Patient Education  2021 [employer].

## 2020-03-28 LAB — C-REACTIVE PROTEIN: CRP: 33.6 mg/L — ABNORMAL HIGH (ref <8.0)

## 2020-03-28 LAB — SEDIMENTATION RATE: Sed Rate: 14 mm/h (ref 0–30)

## 2020-04-01 MED ORDER — COLCHICINE 0.6 MG PO TABS: 0.3000 mg | ORAL_TABLET | Freq: Every day | ORAL | 0 refills | Status: DC

## 2020-04-01 NOTE — Addendum Note (Signed)
Addended by: Fuller Plan on: 04/01/2020 09:42 AM   Modules accepted: Orders

## 2020-04-01 NOTE — Progress Notes (Signed)
I spoke with Donna Callahan about her findings for which I suspect CPDD as a cause of joint pain and inflammation. Pain is very problematic and limiting all activities. She is avoiding NSAIDs on account of previous GI bleed also high blood pressure monitoring for TAA. I recommend trial of a very low dose colchicine for this problem. She is on multiple antihypertensive medications including diltiazem and carvedilol which increase effective concentration of the medication so recommend taking 0.3 mg daily. She has normal renal and hepatic function by most recent labs reviewed.

## 2020-04-08 ENCOUNTER — Other Ambulatory Visit: Payer: Self-pay

## 2020-04-08 ENCOUNTER — Ambulatory Visit: Payer: Medicare HMO | Admitting: Cardiovascular Disease

## 2020-04-08 ENCOUNTER — Encounter: Payer: Self-pay | Admitting: Cardiovascular Disease

## 2020-04-08 VITALS — BP 124/70 | HR 53 | Ht 61.5 in | Wt 126.0 lb

## 2020-04-08 DIAGNOSIS — I712 Thoracic aortic aneurysm, without rupture: Secondary | ICD-10-CM | POA: Diagnosis not present

## 2020-04-08 DIAGNOSIS — I10 Essential (primary) hypertension: Secondary | ICD-10-CM | POA: Diagnosis not present

## 2020-04-08 DIAGNOSIS — I7121 Aneurysm of the ascending aorta, without rupture: Secondary | ICD-10-CM

## 2020-04-08 DIAGNOSIS — I351 Nonrheumatic aortic (valve) insufficiency: Secondary | ICD-10-CM | POA: Diagnosis not present

## 2020-04-08 MED ORDER — AMLODIPINE BESYLATE 5 MG PO TABS: 5.0000 mg | ORAL_TABLET | Freq: Every day | ORAL | 3 refills | Status: AC

## 2020-04-08 NOTE — Patient Instructions (Signed)

## 2020-04-08 NOTE — Progress Notes (Signed)
Cardiology Office Note:    Date:  04/08/2020   ID:  Donna Callahan, DOB May 12, 1930, MRN [id number]  PCP:  Rodrigo Ran, MD  Cardiologist:  Kristeen Miss, MD    Referring MD: Rodrigo Ran, MD   Chief Complaint  Patient presents with  . Congestive Heart Failure    Donna Callahan is a 87 y.o. female with a hx of hypertension, hyperlipidemia, chronic kidney disease stage II, who was seen by Chelsea Aus, PA and me in Sept. For DOE  BP was elevated at that time.    Having lots of GI issues.   Breathing is better.  Gives out when she walks any distance.    Thinks is due to back pain  Is watching her diet   Echo shows normal systolic function with grade 1 diastolic dysfunction.  She has moderate aortic insufficiency with a moderately dilated aorta.  August 17, 2017:  Having some back pain  Unable to walk - has lots of fatigue .   Leg weakness.  No cp or chest tightness or fullness.  Has mod. - severe dilitation of her ascending aorta ( 4.8 cm)  She reports having a brother and sister die of ruptured aortic aneurism   November 17, 2017: Donna Callahan is seen today.  She has a history of diastolic congestive heart failure.  She has a ascending aortic aneurysm.  She has seen Dr. Tyrone Sage.  She is not interested in any surgical intervention.  BP is a bit high today  Advised her to avoid salty foods   February 17, 2018: Donna Callahan is seen today for follow-up of her hypertension, chronic diastolic congestive heart failure, and  thoracic aortic aneurysm. BP is a little elevated today.  Her blood pressure readings from home indicate an occasional high blood pressure but also several normal/low blood pressure readings.  Her blood pressure ranges anywhere from 116 systolic up to 160 systolic on her home readings.  She tries to avoid eating any extra salt.   October 03, 2018: Donna Callahan is seen today for follow-up of her hypertension, chronic diastolic congestive heart failure and thoracic aortic  aneurysm.  She has mild aortic insufficiency. Is generally fatigues , especially with exertion   Has a 4.8 cm ascending aortic aneurism.  Followed by Dr. Tyrone Sage.   April 12, 2019:  Donna Callahan is seen today for follow-up of her hypertension, chronic diastolic congestive heart failure and thoracic aortic aneurysm.  She also has mild aortic insufficiency.   Sees Dr. Simone Curia yearly for her thoracic aneurimsal aneurism   Brought her BP log.   Some readings are elevated.  Some are low   April 08, 2020: Donna Callahan is seen today for follow-up of her hypertension, chronic diastolic congestive heart failure and thoracic aortic aneurysm.  She has mild aortic insufficiency.  Her last CT angiogram of her chest from October 19, 2019 shows that her ascending aorta measures 4.8 cm which is stable.  Her descending aorta is 3.1.  Thoracic Aorta:  --Ascending Aorta: 4.8 cm (stable)  --Aortic Arch: 3.5 cm  --Descending Aorta: 3.1  Will be due for repeat CT angiogram in Sept, 2022  Is having lots of arthritis issues  is on colchicine   No CP or dyspnea     Past Medical History:  Diagnosis Date  . CKD (chronic kidney disease) stage 2, GFR 60-89 ml/min   . Hard of hearing   . High cholesterol   . Hypertension   . Iron deficiency anemia   .  Scoliosis   . Varicose veins     Past Surgical History:  Procedure Laterality Date  . stab phlebectomy Right 04/27/2016   stab phlebectomy right leg by Josephina Gip MD   . stab phlebectomy  Left 05/11/2016   stab phlebectomy > 20 incisions left leg by Josephina Gip MD   . STAPEDECTOMY     bil ears  . TONSILLECTOMY    . TOTAL KNEE ARTHROPLASTY Right 05/02/2014   Procedure: RIGHT TOTAL KNEE ARTHROPLASTY;  Surgeon: Ollen Gross, MD;  Location: [facility];  Service: Orthopedics;  Laterality: Right;  Marland Kitchen VARICOSE VEIN SURGERY      Current Medications: Current Meds  Medication Sig  . acetaminophen (TYLENOL) 500 MG tablet Take 500-1,000 mg by mouth as  needed for moderate pain.   . baclofen (LIORESAL) 10 MG tablet Take 5 mg by mouth as needed for muscle spasms.  Marland Kitchen BIOTIN PO Take by mouth.  . busPIRone (BUSPAR) 7.5 MG tablet Take 7.5 mg by mouth as needed. For anxiety  . Calcium-Magnesium-Vitamin D (CALCIUM 1200+D3 PO) Take 1 tablet by mouth 2 (two) times daily.  . colchicine 0.6 MG tablet Take 0.5 tablets (0.3 mg total) by mouth daily.  Marland Kitchen doxazosin (CARDURA) 2 MG tablet Take 2 mg by mouth daily.   Marland Kitchen estradiol (ESTRACE) 0.1 MG/GM vaginal cream Place 1 Applicatorful vaginally 3 (three) times a week.  . loperamide (IMODIUM) 2 MG capsule Take by mouth as needed for diarrhea or loose stools.  . Multiple Vitamins-Minerals (CENTRUM SILVER ULTRA WOMENS) TABS Take 1 tablet by mouth daily.  . Multiple Vitamins-Minerals (PRESERVISION AREDS 2 PO) Take 1 tablet by mouth 2 (two) times daily.  Marland Kitchen olmesartan (BENICAR) 40 MG tablet TAKE 1 TABLET BY MOUTH EVERY DAY **TO REPLACE IRBESARTAN**  . Polyethyl Glycol-Propyl Glycol (SYSTANE OP) Apply 1 drop to eye every morning.  . potassium chloride (KLOR-CON) 10 MEQ tablet Take 60 mEq by mouth 3 (three) times daily.  . psyllium (METAMUCIL) 58.6 % powder Take 1 packet by mouth at bedtime as needed (for constipation).   . simethicone (MYLICON) 80 MG chewable tablet Chew 1 tablet by mouth daily.  . sodium chloride (OCEAN) 0.65 % SOLN nasal spray Place 1-2 sprays into both nostrils every morning.  . triamterene-hydrochlorothiazide (MAXZIDE-25) 37.5-25 MG tablet Take 1 tablet by mouth daily.   . [DISCONTINUED] amLODipine (NORVASC) 5 MG tablet Take 1 tablet (5 mg total) by mouth daily.  . [DISCONTINUED] diltiazem (TIAZAC) 240 MG 24 hr capsule Take 1 capsule by mouth 2 (two) times daily.     Allergies:   Benadryl [diphenhydramine hcl (sleep)], Diphenhydramine hcl, Penicillins, Quinolones, Sulfacetamide sodium, Penicillin g, and Sulfa antibiotics   Social History   Socioeconomic History  . Marital status: Widowed     Spouse name: Not on file  . Number of children: Not on file  . Years of education: Not on file  . Highest education level: Not on file  Occupational History  . Not on file  Tobacco Use  . Smoking status: Never Smoker  . Smokeless tobacco: Never Used  Vaping Use  . Vaping Use: Never used  Substance and Sexual Activity  . Alcohol use: No  . Drug use: No  . Sexual activity: Never    Comment: HUSBAND DIED Jun 05, 2004  Other Topics Concern  . Not on file  Social History Narrative  . Not on file   Social Determinants of Health   Financial Resource Strain: Not on file  Food Insecurity: Not on file  Transportation Needs: Not on file  Physical Activity: Not on file  Stress: Not on file  Social Connections: Not on file     Family History: The patient's family history includes AAA (abdominal aortic aneurysm) in an other family member; Aortic dissection in her sister; Bronchiolitis in her mother; Cancer - Lung in her son; Fibromyalgia in her daughter; Healthy in her grandchild; Heart attack in her father and maternal grandfather; Hypertension in her mother; Other in an other family member; Pancreatic cancer in her son; Pneumonia in her mother and sister. ROS:   Please see the history of present illness.     All other systems reviewed and are negative.  EKGs/Labs/Other Studies Reviewed:    The following studies were reviewed today:     Recent Labs: No results found for requested labs within last 8760 hours.  Recent Lipid Panel No results found for: CHOL, TRIG, HDL, CHOLHDL, VLDL, LDLCALC, LDLDIRECT  Physical Exam:    Physical Exam: Blood pressure 124/70, pulse (!) 53, height 5' 1.5" (1.562 m), weight 126 lb (57.2 kg), SpO2 98 %.  GEN:  Elderly female,  Frail  HEENT: Normal NECK: No JVD; No carotid bruits LYMPHATICS: No lymphadenopathy CARDIAC: RRR  ,  Soft systolic murmur  RESPIRATORY:  Clear to auscultation without rales, wheezing or rhonchi  ABDOMEN: Soft, non-tender,  non-distended MUSCULOSKELETAL:  No edema; No deformity  SKIN: Warm and dry NEUROLOGIC:  Alert and oriented x 3   EKG:       April 08, 2020: Sinus bradycardia 53 beats a minute.  First-degree AV block.   ASSESSMENT:    1. Nonrheumatic aortic valve insufficiency   2. Primary hypertension   3. Ascending aortic aneurysm (HCC)    PLAN:     1. Chronic diastolic congestive heart failure:        2.  Ascending aortic aneurysm:   Her ascending aortic aneurysm measures 4.8 cm.  It has been followed by Dr. Tyrone Sage.  She will continue to be followed at [facility].  2.  Aortic insufficiency:      Her aortic insufficiency appears to be stable.  3.  HTN:    Blood pressure is well controlled.  Medication Adjustments/Labs and Tests Ordered: Current medicines are reviewed at length with the patient today.  Concerns regarding medicines are outlined above.  Orders Placed This Encounter  Procedures  . EKG 12-Lead   Meds ordered this encounter  Medications  . amLODipine (NORVASC) 5 MG tablet    Sig: Take 1 tablet (5 mg total) by mouth daily.    Dispense:  90 tablet    Refill:  3     . Signed, Kristeen Miss, MD  04/08/2020 1:23 PM    [facility]

## 2020-04-10 ENCOUNTER — Encounter: Payer: Self-pay | Admitting: Internal Medicine

## 2020-04-10 ENCOUNTER — Other Ambulatory Visit: Payer: Self-pay

## 2020-04-10 ENCOUNTER — Ambulatory Visit (INDEPENDENT_AMBULATORY_CARE_PROVIDER_SITE_OTHER): Payer: Medicare HMO | Admitting: Internal Medicine

## 2020-04-10 VITALS — BP 117/63 | HR 72 | Ht 61.5 in | Wt 126.8 lb

## 2020-04-10 DIAGNOSIS — M25512 Pain in left shoulder: Secondary | ICD-10-CM

## 2020-04-10 DIAGNOSIS — G8929 Other chronic pain: Secondary | ICD-10-CM

## 2020-04-10 DIAGNOSIS — M25552 Pain in left hip: Secondary | ICD-10-CM

## 2020-04-10 DIAGNOSIS — R7982 Elevated C-reactive protein (CRP): Secondary | ICD-10-CM | POA: Diagnosis not present

## 2020-04-10 DIAGNOSIS — M25511 Pain in right shoulder: Secondary | ICD-10-CM | POA: Diagnosis not present

## 2020-04-10 DIAGNOSIS — M25551 Pain in right hip: Secondary | ICD-10-CM | POA: Diagnosis not present

## 2020-04-10 DIAGNOSIS — M79641 Pain in right hand: Secondary | ICD-10-CM

## 2020-04-10 DIAGNOSIS — M79642 Pain in left hand: Secondary | ICD-10-CM | POA: Diagnosis not present

## 2020-04-10 MED ORDER — PREDNISONE 20 MG PO TABS: 20.0000 mg | ORAL_TABLET | Freq: Every day | ORAL | 0 refills | Status: DC

## 2020-04-10 NOTE — Progress Notes (Signed)
Office Visit Note  Patient: Donna Callahan             Date of Birth: Jun 11, 1930           MRN: [id number]             PCP: Crist Infante, MD Referring: Crist Infante, MD Visit Date: 04/10/2020   Subjective:  Follow-up (Patient denies changes in symptoms since last visit. Patient complains of bilateral shoulder pain and stiffness in the mornings. Patient feels as if she has almost lost use of right hand. )   History of Present Illness: Donna Callahan is a 87 y.o. female here for follow up of joint pains and high CRP with findings suggestive for CPDD. She was recommended to start a low dose colchicine, due to multiple medications that can increase concentration. Despite this she feels terrible with no noticeable improvement at all after 10 days. She is having very bad stiffness and pain especially in her shoulders so for example can barely put on clothing in the morning. This improves only slightly throughout the day. She has some swelling and difficulty using her right hand in particular.   Review of Systems  Constitutional: Positive for fatigue.  HENT: Negative for mouth sores, mouth dryness and nose dryness.   Eyes: Positive for dryness. Negative for pain, itching and visual disturbance.  Respiratory: Negative for cough, hemoptysis, shortness of breath and difficulty breathing.   Cardiovascular: Negative for chest pain, palpitations and swelling in legs/feet.  Gastrointestinal: Positive for diarrhea. Negative for abdominal pain, blood in stool and constipation.  Endocrine: Negative for increased urination.  Genitourinary: Negative for painful urination.  Musculoskeletal: Positive for arthralgias, joint pain, joint swelling, myalgias, muscle weakness, morning stiffness, muscle tenderness and myalgias.  Skin: Negative for color change, rash and redness.  Allergic/Immunologic: Negative for susceptible to infections.  Neurological: Positive for weakness. Negative for dizziness, numbness,  headaches and memory loss.  Hematological: Negative for swollen glands.  Psychiatric/Behavioral: Negative for confusion and sleep disturbance.     Previous HPI: Donna Callahan is a 87 y.o. female with a history of HFpEF, osteopenia, chronic shoulder pain, scoliosis, thoracic aortic aneurysm, previous anemia from lower GI bleeding here for evaluation of increased joint pains and elevated CRP. She was generally feeling well without chronic arthritis complaints until several months ago when she fairly abruptly developed pain and stiffness involving her shoulders, hips, and hands bilaterally and in some cases diffuse pain all over. This is most severe in the morning and takes hours to improve each day. As an example she finds putting on a shirt in the morning takes up to 5 minutes due to the shoulder stiffness and pain. This is bothering her a great deal and limiting her activities. Sometimes she has numbness going down to the hand but most of the time just pain and stiffness. She does not see much swelling and no rashes or redness. She does not take NSAIDs due to comorbidities and history of GI bleed. She takes tylenol 3 times daily with mild benefit. She was treated with prednisone twice, she felt no big improvement the first time and then a more than 50% improvement in symptoms the second time. She is now off any steroid for about a month and feeling just as badly as this began.  Labs reviewed Uric acid 4.1 ESR 22 CRP 29 CCP neg RF neg ANA neg  PMFS History:  Patient Active Problem List   Diagnosis Date Noted  . Ascending  aortic aneurysm ([place]) 04/08/2020  . Irritable bowel syndrome with diarrhea 03/27/2020  . Laryngopharyngeal reflux 03/27/2020  . Oropharyngeal dysphagia 03/27/2020  . Elevated C-reactive protein (CRP) 03/27/2020  . Bilateral hand pain 03/27/2020  . Aortic insufficiency 04/12/2019  . Bilateral hip pain 12/10/2017  . Bilateral shoulder pain 12/10/2017  . Scoliosis  12/10/2017  . Chronic diastolic CHF (congestive heart failure) ([place]) 12/07/2016  . Varicose veins of left lower extremity with complications 10/93/2355  . Mallet deformity of left little finger 02/23/2015  . Lower GI bleeding 07/26/2014  . Rectal bleeding 07/23/2014  . Hypertension 07/23/2014  . Acute blood loss anemia 07/23/2014  . Hyperlipidemia 07/23/2014  . Rectal bleed 07/23/2014  . OA (osteoarthritis) of knee 05/02/2014  . Varicose veins of right lower extremity with pain     Past Medical History:  Diagnosis Date  . CKD (chronic kidney disease) stage 2, GFR 60-89 ml/min   . Hard of hearing   . High cholesterol   . Hypertension   . Iron deficiency anemia   . Scoliosis   . Varicose veins     Family History  Problem Relation Age of Onset  . Hypertension Mother   . Bronchiolitis Mother   . Pneumonia Mother   . Heart attack Father   . Pneumonia Sister   . Aortic dissection Sister   . AAA (abdominal aortic aneurysm) Other        3 OTHER SIBLINGS DIED FOR THIS  . Other Other        MOTOR VEHICLE ACC  . Cancer - Lung Son        11/2011  . Pancreatic cancer Son   . Fibromyalgia Daughter   . Healthy Grandchild        GREAT GRAND CHILDREN  . Heart attack Maternal Grandfather    Past Surgical History:  Procedure Laterality Date  . stab phlebectomy Right 04/27/2016   stab phlebectomy right leg by Tinnie Gens MD   . stab phlebectomy  Left 05/11/2016   stab phlebectomy > 20 incisions left leg by Tinnie Gens MD   . STAPEDECTOMY     bil ears  . TONSILLECTOMY    . TOTAL KNEE ARTHROPLASTY Right 05/02/2014   Procedure: RIGHT TOTAL KNEE ARTHROPLASTY;  Surgeon: Gaynelle Arabian, MD;  Location: [facility];  Service: Orthopedics;  Laterality: Right;  Marland Kitchen VARICOSE VEIN SURGERY     Social History   Social History Narrative  . Not on file   Immunization History  Administered Date(s) Administered  . Influenza, High Dose Seasonal PF 10/18/2016  . Influenza-Unspecified 10/14/2016  .  PFIZER(Purple Top)SARS-COV-2 Vaccination 02/07/2019, 02/26/2019, 10/22/2019  . Tdap 04/08/2014  . Zoster Recombinat (Shingrix) 10/21/2017, 12/21/2017     Objective: Vital Signs: BP 117/63 (BP Location: Left Arm, Patient Position: Sitting, Cuff Size: Normal)   Pulse 72   Ht 5' 1.5" (1.562 m)   Wt 126 lb 12.8 oz (57.5 kg)   BMI 23.57 kg/m    Physical Exam   Musculoskeletal Exam:  Neck full ROM painful in base of neck with far lateral rotations Shoulders unable to fully abduct right shoulder, very painful with movement and resisted movement, mild tenderness to pressure over deltoids Elbows full ROM no tenderness or swelling Wrists full ROM no tenderness or swelling Fingers show squaring of 1st CMC joints, right hand puffy diffuse swelling with shiny skin appearance and unable to fully extend MCPs, no palpable joint synovitis, left 5th digit flexion deformity Hips nontender to palpation but severe pain anteriorly  with resisted flexion Knees full ROM no tenderness or swelling patellofemoral crepitus present Ankles full ROM no tenderness or swelling    Investigation: No additional findings.  Imaging: XR Hand 2 View Left  Result Date: 03/27/2020 Xray left hand 2 views Radiocarpal joint space probable radial aspect narrowing. Calcification of the TFCC. Mild 1st CMC arthritis without much subluxation. Slight subluxation of 3rd MCP otherwise joint spaces preserved. Narrowing of 3rd DIP space, significantly worse degenerative change in the 5th digit. Severe generalized osteopenia is present. Impression Mild or moderate degenerative joint changes, more advanced in the 5th digit, although 3rd MCP subluxation, osteopenia, and TFCC calcification could reflect inflammatory disease or CPDD  XR Hand 2 View Right  Result Date: 03/27/2020 Xray right hand 2 views Severe narrowing of radiocarpal joint space. Calcification of the TFCC with preservation of ulnar styloid. Moderate degenerative disease of  first CMC joint and partial subluxation. Subluxation and narrowing of 2-3rd MCP joints. Asymmetric narrowing and osteophytes present at multiple DIP joints. Severe generalized osteopenia. Impression Joint changes appear consistent with longstanding osteoarthritis although bone demineralization, MCP subluxation, and TFCC calcifications could reflect inflammatory disease or CPDD   Recent Labs: Lab Results  Component Value Date   WBC 5.7 07/26/2014   HGB 10.0 (L) 07/26/2014   PLT 217 07/26/2014   NA 134 08/17/2017   K 4.6 08/17/2017   CL 96 08/17/2017   CO2 21 08/17/2017   GLUCOSE 111 (H) 08/17/2017   BUN 14 08/17/2017   CREATININE 0.76 08/17/2017   BILITOT 0.5 07/24/2014   ALKPHOS 66 07/24/2014   AST 17 07/24/2014   ALT 17 07/24/2014   PROT 6.0 (L) 07/24/2014   ALBUMIN 3.4 (L) 07/24/2014   CALCIUM 9.8 08/17/2017   GFRAA 83 08/17/2017    Speciality Comments: No specialty comments available.  Procedures:  No procedures performed Allergies: Benadryl [diphenhydramine hcl (sleep)], Diphenhydramine hcl, Penicillins, Quinolones, Sulfacetamide sodium, Penicillin g, and Sulfa antibiotics   Assessment / Plan:     Visit Diagnoses: Chronic pain of both shoulders Bilateral hip pain  - Plan: predniSONE (DELTASONE) 20 MG tablet  Symptoms are somewhat suggestive for PMR with severe shoulder stiffness, pain more on active than passive ROM. I am not sure about the hand swelling and radiographic changes, possibly multiple contributions. Symptoms are definitely worse today than past visit. Recommend trial of restarting moderate prednisone dose of 20 mg and follow up within 2 weeks to repeat exam.  Elevated C-reactive protein (CRP)  High CRP indicates some inflammation and her exam today is more strongly indicative. No clinical features concerning for GCA at this time. Will plan to repeat at f/u for treatment response on moderate dose of prednisone.  Bilateral hand pain  Hand pain was present  before but now with a lot of right hand swelling largely diffuse maybe over flexor tendons too.  Orders: No orders of the defined types were placed in this encounter.  Meds ordered this encounter  Medications  . predniSONE (DELTASONE) 20 MG tablet    Sig: Take 1 tablet (20 mg total) by mouth daily with breakfast.    Dispense:  20 tablet    Refill:  0     Follow-Up Instructions: Return in about 2 weeks (around 04/24/2020).   Collier Salina, MD  Note - This record has been created using [employer].  Chart creation errors have been sought, but may not always  have been located. Such creation errors do not reflect on  the standard of medical care.

## 2020-04-23 ENCOUNTER — Other Ambulatory Visit: Payer: Self-pay | Admitting: Internal Medicine

## 2020-04-23 DIAGNOSIS — M79641 Pain in right hand: Secondary | ICD-10-CM

## 2020-04-23 DIAGNOSIS — R7982 Elevated C-reactive protein (CRP): Secondary | ICD-10-CM

## 2020-04-23 DIAGNOSIS — M25552 Pain in left hip: Secondary | ICD-10-CM

## 2020-04-23 DIAGNOSIS — G8929 Other chronic pain: Secondary | ICD-10-CM

## 2020-04-23 DIAGNOSIS — M25551 Pain in right hip: Secondary | ICD-10-CM

## 2020-04-23 DIAGNOSIS — M25511 Pain in right shoulder: Secondary | ICD-10-CM

## 2020-04-23 NOTE — Telephone Encounter (Signed)
Medicine was discontinued due to lack of efficacy

## 2020-04-23 NOTE — Telephone Encounter (Signed)
Next Visit: 04/30/2020  Last Visit: 04/10/2020  Last Fill: 04/01/2020  Current Dose per office note 04/10/2020: Take 0.5 tablets (0.3 mg total) by mouth daily  Okay to refill Colchicine?

## 2020-04-24 ENCOUNTER — Ambulatory Visit: Payer: Medicare HMO | Admitting: Internal Medicine

## 2020-04-30 ENCOUNTER — Ambulatory Visit: Payer: Medicare HMO | Admitting: Internal Medicine

## 2020-04-30 ENCOUNTER — Encounter: Payer: Self-pay | Admitting: Internal Medicine

## 2020-04-30 ENCOUNTER — Other Ambulatory Visit: Payer: Self-pay

## 2020-04-30 VITALS — BP 102/63 | HR 72 | Ht 62.0 in | Wt 126.0 lb

## 2020-04-30 DIAGNOSIS — G8929 Other chronic pain: Secondary | ICD-10-CM

## 2020-04-30 DIAGNOSIS — M25511 Pain in right shoulder: Secondary | ICD-10-CM | POA: Diagnosis not present

## 2020-04-30 DIAGNOSIS — M419 Scoliosis, unspecified: Secondary | ICD-10-CM

## 2020-04-30 DIAGNOSIS — Z79899 Other long term (current) drug therapy: Secondary | ICD-10-CM

## 2020-04-30 DIAGNOSIS — M79642 Pain in left hand: Secondary | ICD-10-CM | POA: Diagnosis not present

## 2020-04-30 DIAGNOSIS — M25512 Pain in left shoulder: Secondary | ICD-10-CM

## 2020-04-30 DIAGNOSIS — R7982 Elevated C-reactive protein (CRP): Secondary | ICD-10-CM | POA: Diagnosis not present

## 2020-04-30 DIAGNOSIS — M79641 Pain in right hand: Secondary | ICD-10-CM | POA: Diagnosis not present

## 2020-04-30 NOTE — Progress Notes (Signed)
Office Visit Note  Patient: Donna Callahan             Date of Birth: 28-Nov-1930           MRN: [id number]             PCP: Crist Infante, MD Referring: Crist Infante, MD Visit Date: 04/30/2020   Subjective:  Follow-up (Patient notices improvement in symptoms since being on Prednisone. Patient complains of right hand pain. )   History of Present Illness: Donna Callahan is a 87 y.o. female here for follow up for inflammatory joint pain after trial of 2 weeks with prednisone treatment notices a large improvement in pain especially the shoulders. She still has some ongoing right hand pain but decreased swelling. She did not notice much trouble taking the medication although did notice the change in appetite.   Review of Systems  Constitutional: Positive for appetite change. Negative for fatigue.  HENT: Negative for mouth sores, mouth dryness and nose dryness.   Eyes: Positive for dryness. Negative for pain, itching and visual disturbance.  Respiratory: Negative for cough, hemoptysis, shortness of breath and difficulty breathing.   Cardiovascular: Negative for chest pain, palpitations and swelling in legs/feet.  Gastrointestinal: Negative for abdominal pain, blood in stool, constipation and diarrhea.  Endocrine: Negative for increased urination.  Genitourinary: Negative for painful urination.  Musculoskeletal: Positive for arthralgias and joint pain. Negative for joint swelling, myalgias, muscle weakness, morning stiffness, muscle tenderness and myalgias.  Skin: Negative for color change, rash and redness.  Allergic/Immunologic: Negative for susceptible to infections.  Neurological: Positive for weakness. Negative for dizziness, numbness, headaches and memory loss.  Hematological: Negative for swollen glands.  Psychiatric/Behavioral: Negative for confusion and sleep disturbance.    PMFS History:  Patient Active Problem List   Diagnosis Date Noted  . High risk medication use  04/30/2020  . Ascending aortic aneurysm ([place]) 04/08/2020  . Irritable bowel syndrome with diarrhea 03/27/2020  . Laryngopharyngeal reflux 03/27/2020  . Oropharyngeal dysphagia 03/27/2020  . Elevated C-reactive protein (CRP) 03/27/2020  . Bilateral hand pain 03/27/2020  . Aortic insufficiency 04/12/2019  . Bilateral hip pain 12/10/2017  . Bilateral shoulder pain 12/10/2017  . Scoliosis 12/10/2017  . Chronic diastolic CHF (congestive heart failure) ([place]) 12/07/2016  . Varicose veins of left lower extremity with complications 41/32/4401  . Mallet deformity of left little finger 02/23/2015  . Lower GI bleeding 07/26/2014  . Rectal bleeding 07/23/2014  . Hypertension 07/23/2014  . Acute blood loss anemia 07/23/2014  . Hyperlipidemia 07/23/2014  . Rectal bleed 07/23/2014  . OA (osteoarthritis) of knee 05/02/2014  . Varicose veins of right lower extremity with pain     Past Medical History:  Diagnosis Date  . CKD (chronic kidney disease) stage 2, GFR 60-89 ml/min   . Hard of hearing   . High cholesterol   . Hypertension   . Iron deficiency anemia   . Scoliosis   . Varicose veins     Family History  Problem Relation Age of Onset  . Hypertension Mother   . Bronchiolitis Mother   . Pneumonia Mother   . Heart attack Father   . Pneumonia Sister   . Aortic dissection Sister   . AAA (abdominal aortic aneurysm) Other        3 OTHER SIBLINGS DIED FOR THIS  . Other Other        MOTOR VEHICLE ACC  . Cancer - Lung Son        11/2011  .  Pancreatic cancer Son   . Fibromyalgia Daughter   . Healthy Grandchild        GREAT GRAND CHILDREN  . Heart attack Maternal Grandfather    Past Surgical History:  Procedure Laterality Date  . stab phlebectomy Right 04/27/2016   stab phlebectomy right leg by Tinnie Gens MD   . stab phlebectomy  Left 05/11/2016   stab phlebectomy > 20 incisions left leg by Tinnie Gens MD   . STAPEDECTOMY     bil ears  . TONSILLECTOMY    . TOTAL KNEE  ARTHROPLASTY Right 05/02/2014   Procedure: RIGHT TOTAL KNEE ARTHROPLASTY;  Surgeon: Gaynelle Arabian, MD;  Location: [facility];  Service: Orthopedics;  Laterality: Right;  Marland Kitchen VARICOSE VEIN SURGERY     Social History   Social History Narrative  . Not on file   Immunization History  Administered Date(s) Administered  . Influenza, High Dose Seasonal PF 10/18/2016  . Influenza-Unspecified 10/14/2016  . PFIZER(Purple Top)SARS-COV-2 Vaccination 02/07/2019, 02/26/2019, 10/22/2019  . Tdap 04/08/2014  . Zoster Recombinat (Shingrix) 10/21/2017, 12/21/2017     Objective: Vital Signs: BP 102/63 (BP Location: Left Arm, Patient Position: Sitting, Cuff Size: Normal)   Pulse 72   Ht _0  (1.575 m)   Wt 126 lb (57.2 kg)   BMI 23.05 kg/m    Physical Exam Eyes:     Conjunctiva/sclera: Conjunctivae normal.  Cardiovascular:     Rate and Rhythm: Normal rate and regular rhythm.  Pulmonary:     Effort: Pulmonary effort is normal.     Breath sounds: Normal breath sounds.  Skin:    General: Skin is warm and dry.     Findings: No rash.  Neurological:     Mental Status: She is alert.  Psychiatric:        Mood and Affect: Mood normal.     Musculoskeletal Exam: Normal shoulder ROM b/l no swelling or tenderness Normal elbow ROM b/l no swelling or tenderness Right dupuytren's contracture over 4th digit flexor tendon Right wrist prominent ulnar head Ulnar deviation of MCPs on bilateral hands Knees full ROM without swelling or tenderness b/l  Investigation: No additional findings.  Imaging: No results found.  Recent Labs: Lab Results  Component Value Date   WBC 9.8 04/30/2020   HGB 12.1 04/30/2020   PLT 268 04/30/2020   NA 131 (L) 04/30/2020   K 4.7 04/30/2020   CL 96 (L) 04/30/2020   CO2 25 04/30/2020   GLUCOSE 92 04/30/2020   BUN 17 04/30/2020   CREATININE 0.64 04/30/2020   BILITOT 0.6 04/30/2020   ALKPHOS 66 07/24/2014   AST 14 04/30/2020   ALT 18 04/30/2020   PROT 6.8 04/30/2020    ALBUMIN 3.4 (L) 07/24/2014   CALCIUM 9.6 04/30/2020   GFRAA 93 04/30/2020    Speciality Comments: No specialty comments available.  Procedures:  No procedures performed Allergies: Benadryl [diphenhydramine hcl (sleep)], Diphenhydramine hcl, Penicillins, Quinolones, Sulfacetamide sodium, Penicillin g, and Sulfa antibiotics   Assessment / Plan:     Visit Diagnoses: Bilateral hand pain Chronic pain of both shoulders Elevated C-reactive protein (CRP) - Plan: Sedimentation rate, C-reactive protein  Symptoms are largely improved today and hand swelling is improved. Early dupuytren's contracture of right hand noted. Checking ESR and CRP today. Suspect CPDD versus seronegative RA at this time, will see if symptoms return quickly off treatment or may be episodic so observation for now.  High risk medication use - Plan: COMPLETE METABOLIC PANEL WITH GFR, CBC with Differential/Platelet  We  may consider continued use of low dose steroids so will check for changes on CBC and CMP with current medications.  Scoliosis, unspecified scoliosis type, unspecified spinal region   Scoliosis with some chronic back pain recommend can try use of a supporting brace or support will refer for PT or ortho device recommendation.  Orders: Orders Placed This Encounter  Procedures  . Sedimentation rate  . C-reactive protein  . COMPLETE METABOLIC PANEL WITH GFR  . CBC with Differential/Platelet  . Ambulatory referral to Physical Therapy   No orders of the defined types were placed in this encounter.    Follow-Up Instructions: Return in about 4 weeks (around 05/28/2020).   Collier Salina, MD  Note - This record has been created using [employer].  Chart creation errors have been sought, but may not always  have been located. Such creation errors do not reflect on  the standard of medical care.

## 2020-05-01 ENCOUNTER — Other Ambulatory Visit: Payer: Self-pay | Admitting: Radiology

## 2020-05-01 DIAGNOSIS — M419 Scoliosis, unspecified: Secondary | ICD-10-CM

## 2020-05-01 LAB — CBC WITH DIFFERENTIAL/PLATELET
Absolute Monocytes: 529 cells/uL (ref 200–950)
Basophils Absolute: 29 cells/uL (ref 0–200)
Basophils Relative: 0.3 %
Eosinophils Absolute: 10 cells/uL — ABNORMAL LOW (ref 15–500)
Eosinophils Relative: 0.1 %
HCT: 35.8 % (ref 35.0–45.0)
Hemoglobin: 12.1 g/dL (ref 11.7–15.5)
Lymphs Abs: 990 cells/uL (ref 850–3900)
MCH: 29.2 pg (ref 27.0–33.0)
MCHC: 33.8 g/dL (ref 32.0–36.0)
MCV: 86.5 fL (ref 80.0–100.0)
MPV: 9.9 fL (ref 7.5–12.5)
Monocytes Relative: 5.4 %
Neutro Abs: 8242 cells/uL — ABNORMAL HIGH (ref 1500–7800)
Neutrophils Relative %: 84.1 %
Platelets: 268 10*3/uL (ref 140–400)
RBC: 4.14 10*6/uL (ref 3.80–5.10)
RDW: 12.9 % (ref 11.0–15.0)
Total Lymphocyte: 10.1 %
WBC: 9.8 10*3/uL (ref 3.8–10.8)

## 2020-05-01 LAB — COMPLETE METABOLIC PANEL WITH GFR
AG Ratio: 1.5 (calc) (ref 1.0–2.5)
ALT: 18 U/L (ref 6–29)
AST: 14 U/L (ref 10–35)
Albumin: 4.1 g/dL (ref 3.6–5.1)
Alkaline phosphatase (APISO): 68 U/L (ref 37–153)
BUN: 17 mg/dL (ref 7–25)
CO2: 25 mmol/L (ref 20–32)
Calcium: 9.6 mg/dL (ref 8.6–10.4)
Chloride: 96 mmol/L — ABNORMAL LOW (ref 98–110)
Creat: 0.64 mg/dL (ref 0.60–0.88)
GFR, Est African American: 93 mL/min/{1.73_m2} (ref >=60)
GFR, Est Non African American: 80 mL/min/{1.73_m2} (ref >=60)
Globulin: 2.7 g/dL (calc) (ref 1.9–3.7)
Glucose, Bld: 92 mg/dL (ref 65–99)
Potassium: 4.7 mmol/L (ref 3.5–5.3)
Sodium: 131 mmol/L — ABNORMAL LOW (ref 135–146)
Total Bilirubin: 0.6 mg/dL (ref 0.2–1.2)
Total Protein: 6.8 g/dL (ref 6.1–8.1)

## 2020-05-01 LAB — SEDIMENTATION RATE: Sed Rate: 2 mm/h (ref 0–30)

## 2020-05-01 LAB — C-REACTIVE PROTEIN: CRP: 1.4 mg/L (ref <8.0)

## 2020-05-01 NOTE — Progress Notes (Signed)
Labs show inflammatory markers are completely down to normal. Based on this we can see how she does after stopping the prednisone but she should call back if joint problems are returning we may need to taper it slowly.

## 2020-05-14 ENCOUNTER — Ambulatory Visit: Payer: Medicare HMO | Admitting: Orthopaedic Surgery

## 2020-05-14 ENCOUNTER — Ambulatory Visit (INDEPENDENT_AMBULATORY_CARE_PROVIDER_SITE_OTHER): Payer: Medicare HMO

## 2020-05-14 ENCOUNTER — Encounter: Payer: Self-pay | Admitting: Orthopaedic Surgery

## 2020-05-14 DIAGNOSIS — G8929 Other chronic pain: Secondary | ICD-10-CM

## 2020-05-14 DIAGNOSIS — M545 Low back pain, unspecified: Secondary | ICD-10-CM

## 2020-05-14 DIAGNOSIS — M25552 Pain in left hip: Secondary | ICD-10-CM

## 2020-05-14 DIAGNOSIS — M25551 Pain in right hip: Secondary | ICD-10-CM

## 2020-05-14 NOTE — Progress Notes (Signed)
Office Visit Note   Patient: Donna Callahan           Date of Birth: 1930/03/15           MRN: [id number] Visit Date: 05/14/2020              Requested by: Fuller Plan, MD [address] [place],  [place] [address] PCP: Rodrigo Ran, MD   Assessment & Plan: Visit Diagnoses:  1. Chronic left-sided low back pain without sciatica   2. Chronic hip pain, bilateral     Plan: Impression is chronic mid and low back pain as well as advanced degenerative joint disease both hips.  In regards to her back, we have discussed referral to physical therapy and topical NSAIDs.  She would like to try the topical NSAIDs for now.  I will provide her with Pennsaid samples and a handout for topical diclofenac.  In regards to her hips, we have discussed repeat cortisone injection versus total hip arthroplasty.  She would like to hold off on this for now.  Should her symptoms become constant, she will call us to schedule the injection. Follow-Up Instructions: Return if symptoms worsen or fail to improve.   Orders:  Orders Placed This Encounter  Procedures  . XR Lumbar Spine 2-3 Views  . XR Thoracic Spine 2 View  . XR Pelvis 1-2 Views   No orders of the defined types were placed in this encounter.     Procedures: No procedures performed   Clinical Data: No additional findings.   Subjective: Chief Complaint  Patient presents with  . Lower Back - Pain  . Groin Pain    HPI patient is a pleasant 87 year old female who comes in today with bilateral hip pain as well as left mid to lower back pain.  She has been dealing with both of these issues for several years.  She has seen [place] before with left hip pain where she was referred to Dr. Prince Rome for cortisone injection.  This significantly helped for a while.  She was also sent to physical therapy for her back which did seem to help.  The pain she is having today is to both groin areas right greater than left.  Pain is worse with  walking long distances.  Her back pain is worse with any vacuuming or mopping.  She takes Tylenol and occasional Advil without significant relief.  She denies any lower extremity weakness or paresthesias.  She was put on a steroid taper last week which has minimally helped her symptoms today.  Review of Systems as detailed in HPI.  All others reviewed and are negative.   Objective: Vital Signs: There were no vitals taken for this visit.  Physical Exam well-developed well-nourished female in no acute distress.  Alert and oriented x3.  Ortho Exam thoracic and lumbar spine exam shows no spinous or paraspinous tenderness.  She has no pain with extension or flexion.  Negative straight leg raise.  She does have a positive logroll both sides.  No focal weakness.  She is neurovascular intact distally.  Specialty Comments:  No specialty comments available.  Imaging: XR Thoracic Spine 2 View  Result Date: 05/14/2020 Advanced scoliosis.  Advanced spondylosis throughout the entire thoracic spine  XR Lumbar Spine 2-3 Views  Result Date: 05/14/2020 X-rays demonstrate advanced scoliosis and degenerative disc disease with spondylolisthesis L5 on S1  XR Pelvis 1-2 Views  Result Date: 05/14/2020 Advanced degenerative changes to both hips  PMFS History: Patient Active Problem List   Diagnosis Date Noted  . High risk medication use 04/30/2020  . Ascending aortic aneurysm (HCC) 04/08/2020  . Irritable bowel syndrome with diarrhea 03/27/2020  . Laryngopharyngeal reflux 03/27/2020  . Oropharyngeal dysphagia 03/27/2020  . Elevated C-reactive protein (CRP) 03/27/2020  . Bilateral hand pain 03/27/2020  . Aortic insufficiency 04/12/2019  . Bilateral hip pain 12/10/2017  . Bilateral shoulder pain 12/10/2017  . Scoliosis 12/10/2017  . Chronic diastolic CHF (congestive heart failure) (HCC) 12/07/2016  . Varicose veins of left lower extremity with complications 11/25/2015  . Mallet deformity of left  little finger 02/23/2015  . Lower GI bleeding 07/26/2014  . Rectal bleeding 07/23/2014  . Hypertension 07/23/2014  . Acute blood loss anemia 07/23/2014  . Hyperlipidemia 07/23/2014  . Rectal bleed 07/23/2014  . OA (osteoarthritis) of knee 05/02/2014  . Varicose veins of right lower extremity with pain    Past Medical History:  Diagnosis Date  . CKD (chronic kidney disease) stage 2, GFR 60-89 ml/min   . Hard of hearing   . High cholesterol   . Hypertension   . Iron deficiency anemia   . Scoliosis   . Varicose veins     Family History  Problem Relation Age of Onset  . Hypertension Mother   . Bronchiolitis Mother   . Pneumonia Mother   . Heart attack Father   . Pneumonia Sister   . Aortic dissection Sister   . AAA (abdominal aortic aneurysm) Other        3 OTHER SIBLINGS DIED FOR THIS  . Other Other        MOTOR VEHICLE ACC  . Cancer - Lung Son        11/2011  . Pancreatic cancer Son   . Fibromyalgia Daughter   . Healthy Grandchild        GRE[employer] GRAND CHILDREN  . Heart attack Maternal Grandfather     Past Surgical History:  Procedure Laterality Date  . stab phlebectomy Right 04/27/2016   stab phlebectomy right leg by Josephina Gip MD   . stab phlebectomy  Left 05/11/2016   stab phlebectomy > 20 incisions left leg by Josephina Gip MD   . STAPEDECTOMY     bil ears  . TONSILLECTOMY    . TOTAL KNEE ARTHROPLASTY Right 05/02/2014   Procedure: RIGHT TOTAL KNEE ARTHROPLASTY;  Surgeon: Ollen Gross, MD;  Location: [facility];  Service: Orthopedics;  Laterality: Right;  Marland Kitchen VARICOSE VEIN SURGERY     Social History   Occupational History  . Not on file  Tobacco Use  . Smoking status: Never Smoker  . Smokeless tobacco: Never Used  Vaping Use  . Vaping Use: Never used  Substance and Sexual Activity  . Alcohol use: No  . Drug use: No  . Sexual activity: Never    Comment: HUSBAND DIED 05/13/04

## 2020-05-20 ENCOUNTER — Telehealth: Payer: Self-pay | Admitting: Internal Medicine

## 2020-05-20 NOTE — Telephone Encounter (Signed)
Attempted to contact patient, LVM advising patient to call the office. 

## 2020-05-20 NOTE — Telephone Encounter (Signed)
Patient calling to see if there is anything she can take for her pain. Patient has a follow up appointment on Friday. Patient uses [facility]. Please call to advise.

## 2020-05-20 NOTE — Telephone Encounter (Signed)
Spoke with patient, she reports bilateral hip pain which is new. Patient was seen by Dr. Warren Danes PA last week - they did x-rays and diagnosed patient with OA. Patient was previously advised by Dr. Waynard Edwards, PCP, to not take certain OTC pain medications due to health. Advised patient we do not Rx pain medication and it might be best to contact Dr. Laurey Morale office. Patient expressed understanding.

## 2020-05-21 ENCOUNTER — Telehealth: Payer: Self-pay

## 2020-05-21 NOTE — Telephone Encounter (Signed)
Spoke with patient, she complains of bilateral hip pain. Patient has been off Prednisone x 3 weeks. Could patient possibly try Prednisone again? Patient is scheduled for follow-up visit this Friday, 05/24/2020. Should she keep appointment or re-schedule?

## 2020-05-21 NOTE — Telephone Encounter (Signed)
Patient requested a return call to let her know if she should cancel her follow-up appointment with Dr. Dimple Casey on Friday, 05/24/20 since there is nothing additional that can be done for her pain.

## 2020-05-21 NOTE — Telephone Encounter (Signed)
05/24/20 appointment has been canceled.

## 2020-05-21 NOTE — Telephone Encounter (Signed)
Spoke with patient- I recommended Ms. Hakala to scheduled a f/u with orthopedics clinic for her hip arthritis pain. She was recently seen in clinic and recommended trial of intraarticular steroid injections if symptoms increase again I agree with this since would be less total side effects than systemic exposure. She does not need to keep Friday appt. She can f/u in our office if she notices new worsening of other areas.

## 2020-05-24 ENCOUNTER — Ambulatory Visit: Payer: Medicare HMO | Admitting: Internal Medicine

## 2020-06-19 DIAGNOSIS — I700 Atherosclerosis of aorta: Secondary | ICD-10-CM | POA: Diagnosis not present

## 2020-06-19 DIAGNOSIS — I10 Essential (primary) hypertension: Secondary | ICD-10-CM | POA: Diagnosis not present

## 2020-06-19 DIAGNOSIS — D509 Iron deficiency anemia, unspecified: Secondary | ICD-10-CM | POA: Diagnosis not present

## 2020-06-19 DIAGNOSIS — M25519 Pain in unspecified shoulder: Secondary | ICD-10-CM | POA: Diagnosis not present

## 2020-06-19 DIAGNOSIS — I129 Hypertensive chronic kidney disease with stage 1 through stage 4 chronic kidney disease, or unspecified chronic kidney disease: Secondary | ICD-10-CM | POA: Diagnosis not present

## 2020-06-19 DIAGNOSIS — E785 Hyperlipidemia, unspecified: Secondary | ICD-10-CM | POA: Diagnosis not present

## 2020-07-21 DIAGNOSIS — N390 Urinary tract infection, site not specified: Secondary | ICD-10-CM | POA: Diagnosis not present

## 2020-08-29 ENCOUNTER — Other Ambulatory Visit: Payer: Self-pay | Admitting: Surgery

## 2020-08-29 DIAGNOSIS — H40013 Open angle with borderline findings, low risk, bilateral: Secondary | ICD-10-CM | POA: Diagnosis not present

## 2020-08-29 DIAGNOSIS — I712 Thoracic aortic aneurysm, without rupture, unspecified: Secondary | ICD-10-CM

## 2020-10-09 ENCOUNTER — Emergency Department (HOSPITAL_BASED_OUTPATIENT_CLINIC_OR_DEPARTMENT_OTHER)
Admission: EM | Admit: 2020-10-09 | Discharge: 2020-10-09 | Disposition: A | Discharge status: Home / Self Care | Payer: Medicare HMO | Attending: Emergency Medicine | Admitting: Emergency Medicine

## 2020-10-09 ENCOUNTER — Encounter (HOSPITAL_BASED_OUTPATIENT_CLINIC_OR_DEPARTMENT_OTHER): Payer: Self-pay | Admitting: *Deleted

## 2020-10-09 ENCOUNTER — Emergency Department (HOSPITAL_BASED_OUTPATIENT_CLINIC_OR_DEPARTMENT_OTHER): Payer: Medicare HMO | Admitting: Radiology

## 2020-10-09 ENCOUNTER — Other Ambulatory Visit: Payer: Self-pay

## 2020-10-09 DIAGNOSIS — I130 Hypertensive heart and chronic kidney disease with heart failure and stage 1 through stage 4 chronic kidney disease, or unspecified chronic kidney disease: Secondary | ICD-10-CM | POA: Diagnosis not present

## 2020-10-09 DIAGNOSIS — S60222A Contusion of left hand, initial encounter: Secondary | ICD-10-CM

## 2020-10-09 DIAGNOSIS — S99911A Unspecified injury of right ankle, initial encounter: Secondary | ICD-10-CM | POA: Diagnosis present

## 2020-10-09 DIAGNOSIS — W1839XA Other fall on same level, initial encounter: Secondary | ICD-10-CM | POA: Insufficient documentation

## 2020-10-09 DIAGNOSIS — Z79899 Other long term (current) drug therapy: Secondary | ICD-10-CM | POA: Insufficient documentation

## 2020-10-09 DIAGNOSIS — I5032 Chronic diastolic (congestive) heart failure: Secondary | ICD-10-CM | POA: Insufficient documentation

## 2020-10-09 DIAGNOSIS — N182 Chronic kidney disease, stage 2 (mild): Secondary | ICD-10-CM | POA: Insufficient documentation

## 2020-10-09 DIAGNOSIS — S9001XA Contusion of right ankle, initial encounter: Secondary | ICD-10-CM | POA: Diagnosis not present

## 2020-10-09 DIAGNOSIS — Z96651 Presence of right artificial knee joint: Secondary | ICD-10-CM | POA: Diagnosis not present

## 2020-10-09 DIAGNOSIS — S93401A Sprain of unspecified ligament of right ankle, initial encounter: Secondary | ICD-10-CM

## 2020-10-09 DIAGNOSIS — M79642 Pain in left hand: Secondary | ICD-10-CM | POA: Diagnosis not present

## 2020-10-09 NOTE — ED Notes (Signed)
ED Provider at bedside. 

## 2020-10-09 NOTE — Discharge Instructions (Signed)
Take over-the-counter medications as needed for aches and pains.  Follow-up with an orthopedic or sports medicine doctor if the symptoms are not getting better in a week or so

## 2020-10-09 NOTE — ED Notes (Signed)
ED Provider at bedside. Lynelle Doctor MD.

## 2020-10-09 NOTE — ED Triage Notes (Signed)
Fell last night and complaint of left hand pain and right ankle pain.  Pt is ambulatory.

## 2020-10-09 NOTE — ED Provider Notes (Signed)
[facility] EMERGENCY DEPT Provider Note   CSN: [id number] Arrival date & time: 10/09/20  1006     History Chief Complaint  Patient presents with   Marletta Lor    Donna Callahan is a 87 y.o. female.   Fall    Pt presents with complaints of hand and ankle pain.  Pt states she stumbled and fell last evening.  This morning she is having pain in her left hand and her right ankle. Pt is able to walk but it is sore.   No loc.  No numbness or weakness.  Pt does have an old left 5th finger injury that is not related to this fall.  Past Medical History:  Diagnosis Date   CKD (chronic kidney disease) stage 2, GFR 60-89 ml/min    Hard of hearing    High cholesterol    Hypertension    Iron deficiency anemia    Scoliosis    Varicose veins     Patient Active Problem List   Diagnosis Date Noted   High risk medication use 04/30/2020   Ascending aortic aneurysm (HCC) 04/08/2020   Irritable bowel syndrome with diarrhea 03/27/2020   Laryngopharyngeal reflux 03/27/2020   Oropharyngeal dysphagia 03/27/2020   Elevated C-reactive protein (CRP) 03/27/2020   Bilateral hand pain 03/27/2020   Aortic insufficiency 04/12/2019   Bilateral hip pain 12/10/2017   Bilateral shoulder pain 12/10/2017   Scoliosis 12/10/2017   Chronic diastolic CHF (congestive heart failure) (HCC) 12/07/2016   Varicose veins of left lower extremity with complications 11/25/2015   Mallet deformity of left little finger 02/23/2015   Lower GI bleeding 07/26/2014   Rectal bleeding 07/23/2014   Hypertension 07/23/2014   Acute blood loss anemia 07/23/2014   Hyperlipidemia 07/23/2014   Rectal bleed 07/23/2014   OA (osteoarthritis) of knee 05/02/2014   Varicose veins of right lower extremity with pain     Past Surgical History:  Procedure Laterality Date   stab phlebectomy Right 04/27/2016   stab phlebectomy right leg by Josephina Gip MD    stab phlebectomy  Left 05/11/2016   stab phlebectomy > 20  incisions left leg by Josephina Gip MD    STAPEDECTOMY     bil ears   TONSILLECTOMY     TOTAL KNEE ARTHROPLASTY Right 05/02/2014   Procedure: RIGHT TOTAL KNEE ARTHROPLASTY;  Surgeon: Ollen Gross, MD;  Location: [facility];  Service: Orthopedics;  Laterality: Right;   VARICOSE VEIN SURGERY       OB History     Gravida  3   Para  3   Term      Preterm      AB      Living         SAB      IAB      Ectopic      Multiple      Live Births              Family History  Problem Relation Age of Onset   Hypertension Mother    Bronchiolitis Mother    Pneumonia Mother    Heart attack Father    Pneumonia Sister    Aortic dissection Sister    AAA (abdominal aortic aneurysm) Other        3 OTHER SIBLINGS DIED FOR THIS   Other Other        MOTOR VEHICLE ACC   Cancer - Lung Son        11/2011   Pancreatic cancer Son  Fibromyalgia Daughter    Healthy Grandchild        GRE[employer] CHILDREN   Heart attack Maternal Grandfather     Social History   Tobacco Use   Smoking status: Never   Smokeless tobacco: Never  Vaping Use   Vaping Use: Never used  Substance Use Topics   Alcohol use: No   Drug use: No    Home Medications Prior to Admission medications   Medication Sig Start Date End Date Taking? Authorizing Provider  amLODipine (NORVASC) 5 MG tablet Take 1 tablet (5 mg total) by mouth daily. 04/08/20  Yes Nahser, Deloris Ping, MD  doxazosin (CARDURA) 2 MG tablet Take 4 mg by mouth daily.   Yes [provider]  Multiple Vitamins-Minerals (CENTRUM SILVER ULTRA WOMENS) TABS Take 1 tablet by mouth daily.   Yes [provider]  Multiple Vitamins-Minerals (PRESERVISION AREDS 2 PO) Take 1 tablet by mouth 2 (two) times daily.   Yes [provider]  olmesartan (BENICAR) 40 MG tablet TAKE 1 TABLET BY MOUTH EVERY DAY **TO REPLACE IRBESARTAN** 07/22/17  Yes [provider]  potassium chloride (KLOR-CON) 10 MEQ tablet Take 60 mEq by mouth 3  (three) times daily.   Yes [provider]  triamterene-hydrochlorothiazide (MAXZIDE-25) 37.5-25 MG tablet Take 1 tablet by mouth daily.  04/22/15  Yes [provider]  acetaminophen (TYLENOL) 500 MG tablet Take 500-1,000 mg by mouth as needed for moderate pain.     [provider]  baclofen (LIORESAL) 10 MG tablet Take 5 mg by mouth as needed for muscle spasms.    [provider]  busPIRone (BUSPAR) 7.5 MG tablet Take 7.5 mg by mouth as needed. For anxiety    [provider]  carvedilol (COREG) 12.5 MG tablet TAKE 1 TABLET (12.5 MG TOTAL) BY MOUTH 2 (TWO) TIMES DAILY. 10/27/18 10/22/19  Nahser, Deloris Ping, MD  estradiol (ESTRACE) 0.1 MG/GM vaginal cream Place 1 Applicatorful vaginally 3 (three) times a week.    [provider]  loperamide (IMODIUM) 2 MG capsule Take by mouth as needed for diarrhea or loose stools.    [provider]  Polyethyl Glycol-Propyl Glycol (SYSTANE OP) Apply 1 drop to eye every morning.    [provider]  psyllium (METAMUCIL) 58.6 % powder Take 1 packet by mouth at bedtime as needed (for constipation).     [provider]  sodium chloride (OCEAN) 0.65 % SOLN nasal spray Place 1-2 sprays into both nostrils every morning.    [provider]    Allergies    Benadryl [diphenhydramine hcl (sleep)], Diphenhydramine hcl, Penicillins, Quinolones, Sulfacetamide sodium, Penicillin g, and Sulfa antibiotics  Review of Systems   Review of Systems  All other systems reviewed and are negative.  Physical Exam Updated Vital Signs BP 137/74   Pulse (!) 55   Temp 98.7 F (37.1 C) (Oral)   Resp 16   Ht 1.6 m (5\' 3" )   Wt 55.3 kg   SpO2 96%   BMI 21.61 kg/m   Physical Exam Vitals and nursing note reviewed.  Constitutional:      General: She is not in acute distress.    Appearance: She is well-developed.  HENT:     Head: Normocephalic and atraumatic.     Right Ear: External ear normal.      Left Ear: External ear normal.  Eyes:     General: No scleral icterus.       Right eye: No discharge.  Left eye: No discharge.     Conjunctiva/sclera: Conjunctivae normal.  Neck:     Trachea: No tracheal deviation.  Cardiovascular:     Rate and Rhythm: Normal rate.  Pulmonary:     Effort: Pulmonary effort is normal. No respiratory distress.     Breath sounds: No stridor.  Abdominal:     General: There is no distension.  Musculoskeletal:        General: Tenderness present. No swelling or deformity.     Cervical back: Neck supple.     Comments: Ttp left ring finger and hand, no deformity, full rom; ttp right ankle, no foot or 5th mt tenderness, no edema, no deformity  Skin:    General: Skin is warm and dry.     Findings: No rash.  Neurological:     Mental Status: She is alert.     Cranial Nerves: Cranial nerve deficit: no gross deficits.    ED Results / Procedures / Treatments   Labs (all labs ordered are listed, but only abnormal results are displayed) Labs Reviewed - No data to display  EKG None  Radiology DG Ankle Complete Right  Result Date: 10/09/2020 CLINICAL DATA:  Fall, bruising and pain, initial encounter. EXAM: RIGHT ANKLE - COMPLETE 3+ VIEW COMPARISON:  None. FINDINGS: No acute osseous or joint abnormality. IMPRESSION: No acute osseous or joint abnormality. Electronically Signed   By: Leanna Battles M.D.   On: 10/09/2020 11:10   DG Hand Complete Left  Result Date: 10/09/2020 CLINICAL DATA:  Fall, pain EXAM: LEFT HAND - COMPLETE 3+ VIEW COMPARISON:  None. FINDINGS: There is no evidence of fracture or dislocation. Mild osteoarthritic pattern arthrosis about the hand and wrist. Soft tissues are unremarkable. IMPRESSION: No fracture or dislocation of the left hand. Electronically Signed   By: Lauralyn Primes M.D.   On: 10/09/2020 11:11    Procedures Procedures   Medications Ordered in ED Medications - No data to display  ED Course  I have reviewed the  triage vital signs and the nursing notes.  Pertinent labs & imaging results that were available during my care of the patient were reviewed by me and considered in my medical decision making (see chart for details).    MDM Rules/Calculators/A&P                           Patient presented with a mechanical fall that occurred yesterday.  Patient has been able to walk since then.  She is complaining of pain and aching in her right ankle and left hand.  On exam no significant edema or deformity.  Patient did have tenderness palpation.  X-rays do not show signs of fracture or dislocation.  Symptoms are consistent with sprain and contusion.  Discussed supportive care outpatient follow-up  if not improving Final Clinical Impression(s) / ED Diagnoses Final diagnoses:  Contusion of left hand, initial encounter  Sprain of right ankle, unspecified ligament, initial encounter    Rx / DC Orders ED Discharge Orders     None        Linwood Dibbles, MD 10/09/20 1132

## 2020-10-10 ENCOUNTER — Ambulatory Visit: Payer: Medicare HMO | Admitting: Physician Assistant

## 2020-10-10 ENCOUNTER — Ambulatory Visit
Admission: RE | Admit: 2020-10-10 | Discharge: 2020-10-10 | Disposition: A | Discharge status: Home / Self Care | Payer: Medicare HMO | Source: Ambulatory Visit | Attending: Surgery | Admitting: Surgery

## 2020-10-10 VITALS — BP 138/76 | HR 60 | Resp 20 | Ht 63.0 in | Wt 122.0 lb

## 2020-10-10 DIAGNOSIS — I517 Cardiomegaly: Secondary | ICD-10-CM | POA: Diagnosis not present

## 2020-10-10 DIAGNOSIS — I712 Thoracic aortic aneurysm, without rupture, unspecified: Secondary | ICD-10-CM

## 2020-10-10 DIAGNOSIS — J984 Other disorders of lung: Secondary | ICD-10-CM | POA: Diagnosis not present

## 2020-10-10 DIAGNOSIS — I700 Atherosclerosis of aorta: Secondary | ICD-10-CM | POA: Diagnosis not present

## 2020-10-10 MED ORDER — IOPAMIDOL (ISOVUE-370) INJECTION 76%
75.0000 mL | Freq: Once | INTRAVENOUS | Status: AC | PRN
  Administered 2020-10-10: 75 mL via INTRAVENOUS

## 2020-10-10 NOTE — Progress Notes (Signed)
[address]       [place] [address]             [phone]                    Donna Callahan [facility] Medical Record #[id number] Date of Birth: 1930/03/14  Referring: Nahser, Deloris Ping, MD Primary Care: Rodrigo Ran, MD Primary Cardiologist: Kristeen Miss, MD  Chief Complaint:    Chief Complaint  Patient presents with   Thoracic Aortic Aneurysm    yearly f/u with CTA chest 10/19/19    History of Present Illness:    Donna Callahan 87 y.o. female is seen in the office in follow up for dilated ascending aorta.  She denies any change in symptoms, has chronic back pain.  No angina.   The patient is followed by Dr. Elease Hashimoto.  Was first seen by Dr. Tyrone Sage in August 2019.  At that time she was being evaluated by Dr. Roderic Scarce for increasing shortness of breath and echocardiogram was done the patient was referred because of a 4.7 cm dilated ascending aorta.    The patient has a positive family history of aneurysm disease her sister was followed with an ascending aorta dilatation and died of acute aortic dissection in 2008 at age 53. Patient's father died suddenly at age 23 in the 29s, she has one nephew currently followed in our office for a dilated ascending aorta.    [place] and does some light work around her house. She gets up at 5:30 am and does get fatigued throughout the day so she takes a nap. She feels like her low blood pressure is contributing to her fatigue.    Current Activity/ Functional Status:  Patient is independent with mobility/ambulation, transfers, ADL's, IADL's.  Past Medical History:  Diagnosis Date   CKD (chronic kidney disease) stage 2, GFR 60-89 ml/min    Hard of hearing    High cholesterol    Hypertension    Iron deficiency anemia    Varicose veins     Past Surgical History:  Procedure Laterality Date   stab phlebectomy Right 04/27/2016   stab phlebectomy right leg by Josephina Gip MD    stab  phlebectomy  Left 05/11/2016   stab phlebectomy > 20 incisions left leg by Josephina Gip MD    STAPEDECTOMY     bil ears   TONSILLECTOMY     TOTAL KNEE ARTHROPLASTY Right 05/02/2014   Procedure: RIGHT TOTAL KNEE ARTHROPLASTY;  Surgeon: Ollen Gross, MD;  Location: [facility];  Service: Orthopedics;  Laterality: Right;   VARICOSE VEIN SURGERY      Family History  Problem Relation Age of Onset   Hypertension Mother    Bronchiolitis Mother    Pneumonia Mother    Heart attack Father    Aortic dissection Sister    AAA (abdominal aortic aneurysm) Other        3 OTHER SIBLINGS DIED FOR THIS   Other Other        MOTOR VEHICLE ACC   Cancer - Lung Son        11/2011   Cancer - Prostate Son        01/17/2013   Fibromyalgia Daughter    Healthy Grandchild        GRE[employer] GRAND CHILDREN   Patient's nephew [name] is followed for dilated  ascending aorta and has had previous coronary artery bypass  grafting  Patient's sister [name] was followed with a dilated ascending aorta by Dr. Laneta Simmers and died at age 74 with a acute aortic dissection.  Her father died at age 60 of sudden death in 82.   Social History   Tobacco Use  Smoking Status Never Smoker  Smokeless Tobacco Never Used    Social History   Substance and Sexual Activity  Alcohol Use No     Allergies  Allergen Reactions   Benadryl [Diphenhydramine Hcl (Sleep)] Other (See Comments)    Paradoxical response, hyperactivity   Diphenhydramine Hcl Other (See Comments)    Paradoxical response, hyperactivity   Penicillins Hives   Quinolones     Patient was warned about not using Cipro and similar antibiotics. Recent studies have raised concern that fluoroquinolone antibiotics could be associated with an increased risk of aortic aneurysm Fluoroquinolones have non-antimicrobial properties that might jeopardise the integrity of the extracellular matrix of the vascular wall In a  propensity score matched cohort study in [place],  there was a 66% increased rate of aortic aneurysm or dissection associated with oral fluoroquinolone use, compared wit   Sulfacetamide Sodium Nausea And Vomiting   Penicillin G Rash   Sulfa Antibiotics Nausea And Vomiting and Rash    Current Outpatient Medications on File Prior to Visit  Medication Sig Dispense Refill   acetaminophen (TYLENOL) 500 MG tablet Take 500-1,000 mg by mouth as needed for moderate pain.      amLODipine (NORVASC) 5 MG tablet Take 1 tablet (5 mg total) by mouth daily. 90 tablet 3   baclofen (LIORESAL) 10 MG tablet Take 5 mg by mouth as needed for muscle spasms.     busPIRone (BUSPAR) 7.5 MG tablet Take 7.5 mg by mouth as needed. For anxiety     doxazosin (CARDURA) 2 MG tablet Take 4 mg by mouth daily.     estradiol (ESTRACE) 0.1 MG/GM vaginal cream Place 1 Applicatorful vaginally 3 (three) times a week.     loperamide (IMODIUM) 2 MG capsule Take by mouth as needed for diarrhea or loose stools.     Multiple Vitamins-Minerals (CENTRUM SILVER ULTRA WOMENS) TABS Take 1 tablet by mouth daily.     Multiple Vitamins-Minerals (PRESERVISION AREDS 2 PO) Take 1 tablet by mouth 2 (two) times daily.     olmesartan (BENICAR) 40 MG tablet TAKE 1 TABLET BY MOUTH EVERY DAY **TO REPLACE IRBESARTAN**  3   Polyethyl Glycol-Propyl Glycol (SYSTANE OP) Apply 1 drop to eye every morning.     potassium chloride (KLOR-CON) 10 MEQ tablet Take 60 mEq by mouth 3 (three) times daily.     psyllium (METAMUCIL) 58.6 % powder Take 1 packet by mouth at bedtime as needed (for constipation).      sodium chloride (OCEAN) 0.65 % SOLN nasal spray Place 1-2 sprays into both nostrils every morning.     triamterene-hydrochlorothiazide (MAXZIDE-25) 37.5-25 MG tablet Take 1 tablet by mouth daily.      carvedilol (COREG) 12.5 MG tablet TAKE 1 TABLET (12.5 MG TOTAL) BY MOUTH 2 (TWO) TIMES DAILY. 180 tablet 3   No current facility-administered medications on file prior to visit.        PHYSICAL  EXAMINATION: Vitals:   10/10/20 1538  BP: 138/76  Pulse: 60  Resp: 20  SpO2: 97%    General appearance: alert, cooperative, appears stated age and no distress Resp: clear to auscultation bilaterally Cardio: regular rate and rhythm, S1, S2 normal, no murmur, click, rub or gallop GI: soft, non-tender;  bowel sounds normal; no masses,  no organomegaly Extremities: extremities normal, atraumatic, no cyanosis or edema Neurologic: Grossly normal  Diagnostic Studies & Laboratory data:     Recent Radiology Findings:   CLINICAL DATA:  Thoracic aortic aneurysm.   EXAM: CT ANGIOGRAPHY CHEST WITH CONTRAST   TECHNIQUE: Multidetector CT imaging of the chest was performed using the standard protocol during bolus administration of intravenous contrast. Multiplanar CT image reconstructions and MIPs were obtained to evaluate the vascular anatomy.   CONTRAST:  20mL ISOVUE-370 IOPAMIDOL (ISOVUE-370) INJECTION 76%   COMPARISON:  October 19, 2019.   FINDINGS: Cardiovascular: 4.9 cm ascending thoracic aortic aneurysm is noted. No dissection is noted. Atherosclerosis of thoracic aorta is noted. Great vessels are widely patent without significant stenosis. Transverse aortic arch measures 2.8 cm. Proximal descending thoracic aorta measures 2.4 cm. Mild cardiomegaly. No pericardial effusion.   Mediastinum/Nodes: 1.5 cm left thyroid nodule is noted. Adenopathy is noted. The esophagus is unremarkable.   Lungs/Pleura: No pneumothorax or pleural effusion is noted. Mild biapical scarring is noted. Stable right middle lobe nodule is noted. 14 x 9 mm nodular density is now noted posteriorly in the left lower lobe best seen on image number 92 of series 8.   Upper Abdomen: No acute abnormality.   Musculoskeletal: No chest wall abnormality. No acute or significant osseous findings.   Review of the MIP images confirms the above findings.   IMPRESSION: Grossly stable 4.9 cm ascending thoracic  aortic aneurysm. Recommend semi-annual imaging followup by CTA or MRA and referral to cardiothoracic surgery if not already obtained. This recommendation follows 2010 ACCF/AHA/AATS/ACR/ASA/SCA/SCAI/SIR/STS/SVM Guidelines for the Diagnosis and Management of Patients With Thoracic Aortic Disease. Circulation. 2010; 121: D149-F026. Aortic aneurysm NOS (ICD10-I71.9).   1.5 cm left thyroid nodule is noted. Recommend thyroid US. (Ref: J Am Coll Radiol. 2015 Feb;12(2): 143-50).   Interval development of 14 x 9 mm nodular density posteriorly in the left lower lobe. Consider one of the following in 3 months for both low-risk and high-risk individuals: (a) repeat chest CT, (b) follow-up PET-CT, or (c) tissue sampling. This recommendation follows the consensus statement: Guidelines for Management of Incidental Pulmonary Nodules Detected on CT Images: From the Fleischner Society 2017; Radiology 2017; 284:228-243.   Aortic Atherosclerosis (ICD10-I70.0).     Electronically Signed   By: Lupita Raider M.D.   On: 10/10/2020 15:16 CT ANGIO CHEST AORTA W/CM & OR WO/CM  Result Date: 10/19/2019 CLINICAL DATA:  Thoracic aortic aneurysm, follow-up, currently asymptomatic. EXAM: CT ANGIOGRAPHY CHEST WITH CONTRAST TECHNIQUE: Multidetector CT imaging of the chest was performed using the standard protocol during bolus administration of intravenous contrast. Multiplanar CT image reconstructions and MIPs were obtained to evaluate the vascular anatomy. CONTRAST:  51mL ISOVUE-370 IOPAMIDOL (ISOVUE-370) INJECTION 76% COMPARISON:  10/06/2018 and previous FINDINGS: Cardiovascular: Borderline cardiomegaly. No pericardial effusion. Fair contrast opacification of the pulmonary arterial tree; the exam was not optimized for detection of pulmonary emboli. Coronary calcifications. Good contrast opacification of the thoracic aorta. No dissection or stenosis. Aortic Root: --Valve: 2.8 cm --Sinuses: 4.2 cm --Sinotubular Junction:  3.4 cm Limitations by motion: Moderate Thoracic Aorta: --Ascending Aorta: 4.8 cm (stable) --Aortic Arch: 3.5 cm --Descending Aorta: 3.1 Classic 3 vessel brachiocephalic arterial origin anatomy without proximal stenosis. The brachiocephalic vessels are tortuous. Scattered calcified plaque in the visualized proximal abdominal aorta. Mediastinum/Nodes: No mass or adenopathy. Lungs/Pleura: No pleural effusion. No pneumothorax. Linear scarring or subsegmental atelectasis the left lung base as before. Peripheral interstitial opacities in both lower lobes  have slightly increased since prior study. 0.7 cm subpleural right middle lobe nodule (Im6,Se103) , stable since 08/27/2017. No new nodules. Upper Abdomen: No acute findings. Musculoskeletal: Bilateral shoulder DJD. Thoracolumbar levoscoliosis apex L1 without evident underlying vertebral anomaly. Spondylitic changes in the visualized lower cervical spine. Negative for fracture or other acute bone abnormality. Review of the MIP images confirms the above findings. IMPRESSION: 1. Stable 4.8 cm ascending thoracic aortic aneurysm without complicating features. Recommend semi-annual imaging followup by CTA or MRA. This recommendation follows 2010 ACCF/AHA/AATS/ACR/ASA/SCA/SCAI/SIR/STS/SVM Guidelines for the Diagnosis and Management of Patients With Thoracic Aortic Disease. Circulation. 2010; 121: Z610-R604 2. Coronary and  aortic Atherosclerosis (ICD10-I70.0). Electronically Signed   By: Corlis Leak M.D.   On: 10/19/2019 15:30   Ct Angio Chest Aorta W &/or Wo Contrast  Result Date: 10/06/2018 CLINICAL DATA:  Follow-up TAA EXAM: CT ANGIOGRAPHY CHEST WITH CONTRAST TECHNIQUE: Multidetector CT imaging of the chest was performed using the standard protocol during bolus administration of intravenous contrast. Multiplanar CT image reconstructions and MIPs were obtained to evaluate the vascular anatomy. CONTRAST:  75mL ISOVUE-370 IOPAMIDOL (ISOVUE-370) INJECTION 76% COMPARISON:   03/24/2018, 08/27/2017 FINDINGS: Cardiovascular: Preferential opacification of the thoracic aorta. Unchanged enlargement of the tubular ascending thoracic aorta, measuring 4.8 x 4.8 cm. The aortic valve measures 2.2 cm and the sinuses of Valsalva measure 4.1 cm. Normal caliber of the mildly tortuous descending thoracic aorta. Mixed aortic atherosclerosis. Normal heart size. Left coronary artery calcifications. No pericardial effusion. Mediastinum/Nodes: No enlarged mediastinal, hilar, or axillary lymph nodes. Calcified 1.2 cm nodule of the left thyroid, unchanged. Trachea, and esophagus demonstrate no significant findings. Lungs/Pleura: Bandlike scarring of the dependent left lung. Stable, benign 6 mm pulmonary nodule of the right middle lobe (series 7, image 94). No pleural effusion or pneumothorax. Upper Abdomen: No acute abnormality. Musculoskeletal: No chest wall abnormality. No acute or significant osseous findings. Review of the MIP images confirms the above findings. IMPRESSION: 1. Unchanged enlargement of the tubular ascending thoracic aorta, measuring 4.8 x 4.8 cm. The aortic valve measures 2.2 cm and the sinuses of Valsalva measure 4.1 cm. Normal caliber of the mildly tortuous descending thoracic aorta. 2. Aortic Atherosclerosis (ICD10-I70.0). Aortic aneurysm NOS (ICD10-I71.9). 3.  Coronary artery disease. Electronically Signed   By: Lauralyn Primes M.D.   On: 10/06/2018 11:55   Ct Angio Chest Aorta W &/or Wo Contrast  Result Date: 03/24/2018 CLINICAL DATA:  Follow-up thoracic aortic aneurysm EXAM: CT ANGIOGRAPHY CHEST WITH CONTRAST TECHNIQUE: Multidetector CT imaging of the chest was performed using the standard protocol during bolus administration of intravenous contrast. Multiplanar CT image reconstructions and MIPs were obtained to evaluate the vascular anatomy. CONTRAST:  75mL ISOVUE-370 IOPAMIDOL (ISOVUE-370) INJECTION 76% Creatinine was obtained on site at [facility] at [address]  Results: Creatinine 0.8 mg/dL. COMPARISON:  08/27/2017 FINDINGS: Cardiovascular: Thoracic aorta again demonstrates atherosclerotic calcifications without evidence of dissection. There is fusiform dilatation of the ascending aorta measuring 4.8 cm in greatest dimension on the axial images at the level of the pulmonary artery. This is stable in appearance when compared with the prior exam. Normal tapering in the thoracic aortic arch is noted. No aneurysmal dilatation in the descending thoracic aorta is noted. The origins of the brachiocephalic vessels are within normal limits. Mild coronary calcifications are seen. The heart is at the upper limits of normal in size. No central pulmonary embolus is seen. The venous drainage from the left occurs via a hypertrophied superior intercostal vein stable from the previous  exam. Mediastinum/Nodes: Scattered hypodensities are again identified in the thyroid with a focal calcification on the left. No sizable hilar or mediastinal adenopathy is noted. The esophagus as visualized is within normal limits. Lungs/Pleura: Lungs are well aerated bilaterally. Mild scarring is noted in the left lower lobe. Mild lingular scarring is noted as well. Stable from the prior exam. No focal infiltrate or sizable effusion is noted. No nodular changes are seen. Upper Abdomen: Visualized upper abdomen shows no acute abnormality. Musculoskeletal: Degenerative changes of the thoracic spine are noted. No acute bony abnormality is seen. Review of the MIP images confirms the above findings. IMPRESSION: Stable ascending thoracic aortic aneurysm. Recommend semi-annual imaging followup by CTA or MRA and referral to cardiothoracic surgery if not already obtained. This recommendation follows 2010 ACCF/AHA/AATS/ACR/ASA/SCA/SCAI/SIR/STS/SVM Guidelines for the Diagnosis and Management of Patients With Thoracic Aortic Disease. Circulation. 2010; 121: Z610-R604. Aortic aneurysm NOS (ICD10-I71.9) Aortic  Atherosclerosis (ICD10-I70.0). Electronically Signed   By: Alcide Clever M.D.   On: 03/24/2018 10:07    Ct Angio Chest Aorta W &/or Wo Contrast  Result Date: 08/27/2017 CLINICAL DATA:  Follow-up thoracic aortic aneurysm. EXAM: CT ANGIOGRAPHY CHEST WITH CONTRAST TECHNIQUE: Multidetector CT imaging of the chest was performed using the standard protocol during bolus administration of intravenous contrast. Multiplanar CT image reconstructions and MIPs were obtained to evaluate the vascular anatomy. CONTRAST:  ISOVUE-370 IOPAMIDOL (ISOVUE-370) INJECTION 76% COMPARISON:  CT abdomen pelvis-07/23/2014 FINDINGS: Vascular Findings: Fusiform aneurysmal dilatation of the ascending thoracic aorta was measurements as follows. The thoracic aorta tapers to a normal caliber at the level of the aortic arch. The descending thoracic aorta is mildly tortuous but of normal caliber. No evidence of thoracic aortic dissection or periaortic stranding on this nongated examination. Conventional configuration of the aortic arch. The branch vessels of the aortic arch appear widely patent throughout their imaged course. Cardiomegaly. Coronary artery calcifications. Small amount of fluid is seen within the pericardial recess. No pericardial effusion. Although this examination was not tailored for the evaluation the pulmonary arteries, there are no discrete filling defects within the central pulmonary arterial tree to suggest central pulmonary embolism. Normal caliber the main pulmonary artery. 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 ------------------------------------------------------------- Thoracic aortic measurements: Sinotubular junction 41 mm as measured in greatest oblique coronal  dimension. Proximal ascending aorta 48 mm as measured in greatest oblique axial dimension at the level of the main pulmonary artery (image 51, series 4) and approximately 50 mm in greatest oblique short axis coronal diameter (coronal image 34, series 7). Aortic arch aorta 29 mm as measured in greatest oblique sagittal dimension. Proximal descending thoracic aorta 27 mm as measured in greatest oblique axial dimension at the level of the main pulmonary artery. Distal descending thoracic aorta 27 mm as measured in greatest oblique axial dimension at the level of the diaphragmatic hiatus. Review of the MIP images confirms the above findings. ------------------------------------------------------------- Non-Vascular Findings: Mediastinum/Lymph Nodes: No bulky mediastinal, hilar axillary lymphadenopathy. Lungs/Pleura: Minimal dependent subpleural ground-glass atelectasis. Minimal subsegmental atelectasis within the left lower lobe. No discrete focal airspace opacities. No pleural effusion or pneumothorax. No discrete pulmonary nodules. The central pulmonary airways appear widely patent. Upper abdomen: Limited early arterial phase evaluation of the upper abdomen demonstrates enlargement of the caliber of the central aspect of the pancreatic duct  measuring approximately 5 mm in diameter (image 109, series 4, similar to remote abdominal CT performed 07/2014. Musculoskeletal: No acute or aggressive osseous abnormalities. Moderate rotatory scoliotic curvature of the thoracolumbar spine with dominant mid component convex to the left with associated moderate severe DDD within the central component of the concavity. Regional soft tissues appear normal. Mildly heterogeneous appearing thyroid gland. Note is made of an approximately 0.5 cm dystrophic calcification within the left lobe of the thyroid. IMPRESSION: 1. Uncomplicated fusiform aneurysmal dilatation of the ascending thoracic aorta measuring 50 mm in diameter. Aortic  aneurysm NOS (ICD10-I71.9). 2. Coronary artery calcifications. Aortic Atherosclerosis (ICD10-I70.0). 3. 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 Electronically Signed   By: Simonne Come M.D.   On: 08/27/2017 10:26     I have independently reviewed the above radiology studies  and reviewed the findings with the patient.   ECHO 08/23/2017: Transthoracic Echocardiography   Patient:    Fairy, Ashlock MR #:       [id number] Study Date: 08/23/2017 Gender:     F Age:        87 Height:     160 cm Weight:     60.2 kg BSA:        1.64 m^2 Pt. Status: Room:    REFERRING    Kristeen Miss, M.D.  ATTENDING    Olga Millers  SONOGRAPHER  Clearence Ped, RCS  PERFORMING   [facility]  ORDERING     Nahser, Jr  REFERRING    Nahser, Jr   cc:   ------------------------------------------------------------------- LV EF: 50% -   55%   ------------------------------------------------------------------- Indications:      Thoracic Aortic Aneurysm without rupture (W09).   ------------------------------------------------------------------- History:   PMH:  CKD, HLD. old echo 9/18 ascending aorta 48mm, mod ai and there was no effusion oppose to some effusion on this study. HLD. CKD.  Risk factors:  Hypertension.   ------------------------------------------------------------------- Study Conclusions   - Left ventricle: The cavity size was normal. There was mild focal   basal hypertrophy of the septum. Systolic function was normal.   The estimated ejection fraction was in the range of 50% to 55%.   Wall motion was normal; there were no regional wall motion   abnormalities. Doppler parameters are consistent with abnormal   left ventricular relaxation (grade 1 diastolic dysfunction). - Aortic valve: There was mild regurgitation. - Mitral valve: There was mild  regurgitation. - Left atrium: The atrium was moderately dilated. - Pericardium, extracardiac: A small pericardial effusion was   identified.   Impressions:   - Normal LV systolic function; mild diastolic dysfunction; mild AI;   severely dilated ascending aorta (4.9 cm; suggest CTA to further   assess); mild MR; moderate LAE; small pericardial effusion.   ------------------------------------------------------------------- Study data:  Comparison was made to the study of 10/13/2016.  Study status:  Routine.  Procedure:  The patient reported no pain pre or post test. Transthoracic echocardiography. Image quality was adequate.          Transthoracic echocardiography.  M-mode, complete 2D, spectral Doppler, and color Doppler.  Birthdate: Patient birthdate: 12/20/1930.  Age:  Patient is 87 yr old.  Sex: Gender: female.    BMI: 23.5 kg/m^2.  Blood pressure:     120/62 Patient status:  Outpatient.  Study date:  Study date: 08/23/2017. Study time: 07:49 AM.  Location:  [facility]   -------------------------------------------------------------------   -------------------------------------------------------------------  Left ventricle:  The cavity size was normal. There was mild focal basal hypertrophy of the septum. Systolic function was normal. The estimated ejection fraction was in the range of 50% to 55%. Wall motion was normal; there were no regional wall motion abnormalities. Doppler parameters are consistent with abnormal left ventricular relaxation (grade 1 diastolic dysfunction).   ------------------------------------------------------------------- Aortic valve:   Trileaflet; normal thickness leaflets. Mobility was not restricted.  Doppler:  Transvalvular velocity was within the normal range. There was no stenosis. There was mild regurgitation.   ------------------------------------------------------------------- Aorta:  Aortic root: The aortic root was normal in  size. Ascending aorta: The ascending aorta was severely dilated.   ------------------------------------------------------------------- Mitral valve:   Structurally normal valve.   Mobility was not restricted.  Doppler:  Transvalvular velocity was within the normal range. There was no evidence for stenosis. There was mild regurgitation.   ------------------------------------------------------------------- Left atrium:  The atrium was moderately dilated.   ------------------------------------------------------------------- Right ventricle:  The cavity size was normal. Systolic function was normal.   ------------------------------------------------------------------- Pulmonic valve:    Doppler:  Transvalvular velocity was within the normal range. There was no evidence for stenosis.   ------------------------------------------------------------------- Tricuspid valve:   Structurally normal valve.    Doppler: Transvalvular velocity was within the normal range. There was trivial regurgitation.   ------------------------------------------------------------------- Pulmonary artery:   Systolic pressure was within the normal range.   ------------------------------------------------------------------- Right atrium:  The atrium was normal in size.   ------------------------------------------------------------------- Pericardium:  A small pericardial effusion was identified.   ------------------------------------------------------------------- Systemic veins: Inferior vena cava: The vessel was normal in size. The respirophasic diameter changes were in the normal range (= 50%), consistent with normal central venous pressure. Diameter: 16 mm.   ------------------------------------------------------------------- Measurements    IVC                                        Value        Reference  ID                                         16    mm     ----------    Left ventricle                              Value        Reference  LV ID, ED, PLAX chordal                    50.2  mm     43 - 52  LV ID, ES, PLAX chordal                    36    mm     23 - 38  LV fx shortening, PLAX chordal     (L)     28    %      >=29  LV PW thickness, ED                        10.22 mm     ----------  IVS/LV PW ratio, ED                (H)  1.31         <=1.3  Stroke volume, 2D                          104   ml     ----------  Stroke volume/bsa, 2D                      63    ml/m^2 ----------  LV e&', lateral                             9.36  cm/s   ----------  LV E/e&', lateral                           5.6          ----------  LV e&', medial                              3.81  cm/s   ----------  LV E/e&', medial                            13.75        ----------  LV e&', average                             6.59  cm/s   ----------  LV E/e&', average                           7.96         ----------    Ventricular septum                         Value        Reference  IVS thickness, ED                          13.43 mm     ----------    LVOT                                       Value        Reference  LVOT ID, S                                 22    mm     ----------  LVOT area                                  3.8   cm^2   ----------  LVOT peak velocity, S                      104   cm/s   ----------  LVOT mean velocity, S                      72.7  cm/s   ----------  LVOT VTI, S  27.3  cm     ----------    Aortic valve                               Value        Reference  Aortic regurg pressure half-time           497   ms     ----------    Aorta                                      Value        Reference  Aortic root ID, ED                         37    mm     ----------    Left atrium                                Value        Reference  LA ID, A-P, ES                             28    mm     ----------  LA ID/bsa, A-P                              1.7   cm/m^2 <=2.2  LA volume, S                               61.7  ml     ----------  LA volume/bsa, S                           37.5  ml/m^2 ----------  LA volume, ES, 1-p A4C                     77.4  ml     ----------  LA volume/bsa, ES, 1-p A4C                 47.1  ml/m^2 ----------  LA volume, ES, 1-p A2C                     49.6  ml     ----------  LA volume/bsa, ES, 1-p A2C                 30.2  ml/m^2 ----------    Mitral valve                               Value        Reference  Mitral E-wave peak velocity                52.4  cm/s   ----------  Mitral A-wave peak velocity                109   cm/s   ----------  Mitral deceleration time           (H)  232   ms     150 - 230  Mitral E/A ratio, peak                     0.5          ----------    Pulmonary arteries                         Value        Reference  PA pressure, S, DP                         25    mm Hg  <=30    Tricuspid valve                            Value        Reference  Tricuspid regurg peak velocity             236   cm/s   ----------  Tricuspid peak RV-RA gradient              22    mm Hg  ----------  Tricuspid maximal regurg velocity,         236   cm/s   ----------  PISA    Right atrium                               Value        Reference  RA ID, S-I, ES, A4C                (H)     49.4  mm     34 - 49  RA area, ES, A4C                           12.7  cm^2   8.3 - 19.5  RA volume, ES, A/L                         26.4  ml     ----------  RA volume/bsa, ES, A/L                     16.1  ml/m^2 ----------    Systemic veins                             Value        Reference  Estimated CVP                              3     mm Hg  ----------    Right ventricle                            Value        Reference  TAPSE                                      21.7  mm     ----------  RV pressure, S, DP  25    mm Hg  <=30  RV s&', lateral, S                          11.2  cm/s    ----------   Legend: (L)  and  (H)  mark values outside specified reference range.   ------------------------------------------------------------------- Prepared and Electronically Authenticated by   Olga Millers 2019-08-05T15:13:31   Recent Lab Findings: Lab Results  Component Value Date   WBC 5.7 07/26/2014   HGB 10.0 (L) 07/26/2014   HCT 30.8 (L) 07/26/2014   PLT 217 07/26/2014   GLUCOSE 111 (H) 08/17/2017   ALT 17 07/24/2014   AST 17 07/24/2014   NA 134 08/17/2017   K 4.6 08/17/2017   CL 96 08/17/2017   CREATININE 0.76 08/17/2017   BUN 14 08/17/2017   CO2 21 08/17/2017   INR 1.05 07/23/2014   Aortic Size Index=     4.8    /Body surface area is 1.59 meters squared. =2.9  < 2.75 cm/m2      4% risk per year 2.75 to 4.25          8% risk per year > 4.25 cm/m2    20% risk per year  Aortic Cross section area/ Height ratio=   Chronic Kidney Disease   Stage I     GFR >90  Stage II    GFR 60-89  Stage IIIA GFR 45-59  Stage IIIB GFR 30-44  Stage IV   GFR 15-29  Stage V    GFR  <15  Lab Results  Component Value Date   CREATININE 0.76 08/17/2017   CrCl cannot be calculated (Patient's most recent lab result is older than the maximum 21 days allowed.).  Assessment / Plan:    Patient is an 87 year-old female with a positive family history of aortic dissection with a 4.9cm dilated aorta and a relatively small body size.  Follow-up scan today shows the size of her aorta remains stable.  I reviewed with the patient a CT scan with diagnosis of dilated ascending aorta along with the risk of dissection. At  age 61 she says she is not interested in any major surgical interventions but would like to follow the size of her aorta to know if there are any changes.    Unchanged enlargement of the tubular ascending thoracic aorta, measuring 4.9 cm.  2. 1.5 cm left thyroid nodule is noted. Recommend thyroid US-wound recommend follow-up by Dr. Waynard Edwards.  Interval development of 14 x 9  mm nodular density posteriorly in the left lower lobe. Consider one of the following in 3 months for both low-risk and high-risk individuals: (a) repeat chest CT, (b) follow-up PET-CT, or (c) tissue sampling. -would schedule an appointment with Dr. Dorris Fetch asap 4. She was told to cut some of her BP medications in half because she is dropping too low mid-day and its causing fatigue.   She was cautioned about strenuous lifting, need for good blood pressure control, and to avoid use of quinolones.    Follow-up scan today shows sizes remain stable. She will need follow-up with PCP for thyroid nodule. Follow-up with Dr. Dorris Fetch for new pulmonary nodule. Continue to follow-up with Dr. Elease Hashimoto for blood pressure concerns.   Jari Favre, PA-C      [address] [place].[address] [place] [address] Office [phone]

## 2020-10-10 NOTE — Patient Instructions (Addendum)
  IMPRESSION: Grossly stable 4.9 cm ascending thoracic aortic aneurysm. Recommend semi-annual imaging followup by CTA or MRA and referral to cardiothoracic surgery if not already obtained. This recommendation follows 2010 ACCF/AHA/AATS/ACR/ASA/SCA/SCAI/SIR/STS/SVM Guidelines for the Diagnosis and Management of Patients With Thoracic Aortic Disease. Circulation. 2010; 121: J673-A193. Aortic aneurysm NOS (ICD10-I71.9).   1.5 cm left thyroid nodule is noted. Recommend thyroid US. (Ref: J Am Coll Radiol. 2015 Feb;12(2): 143-50).   Interval development of 14 x 9 mm nodular density posteriorly in the left lower lobe. Consider one of the following in 3 months for both low-risk and high-risk individuals: (a) repeat chest CT, (b) follow-up PET-CT, or (c) tissue sampling. This recommendation follows the consensus statement: Guidelines for Management of Incidental Pulmonary Nodules Detected on CT Images: From the Fleischner Society 2017; Radiology 2017; 284:228-243.   he was cautioned about strenuous lifting, need for good blood pressure control, and to avoid use of quinolones.

## 2020-10-23 ENCOUNTER — Other Ambulatory Visit: Payer: Self-pay | Admitting: Internal Medicine

## 2020-10-23 ENCOUNTER — Other Ambulatory Visit (HOSPITAL_COMMUNITY): Payer: Self-pay | Admitting: Internal Medicine

## 2020-10-23 DIAGNOSIS — E041 Nontoxic single thyroid nodule: Secondary | ICD-10-CM

## 2020-10-23 DIAGNOSIS — R911 Solitary pulmonary nodule: Secondary | ICD-10-CM

## 2020-10-24 ENCOUNTER — Ambulatory Visit
Admission: RE | Admit: 2020-10-24 | Discharge: 2020-10-24 | Disposition: A | Discharge status: Home / Self Care | Payer: BC Managed Care – PPO | Source: Ambulatory Visit | Attending: Internal Medicine | Admitting: Internal Medicine

## 2020-10-24 DIAGNOSIS — E042 Nontoxic multinodular goiter: Secondary | ICD-10-CM | POA: Diagnosis not present

## 2020-10-24 DIAGNOSIS — E041 Nontoxic single thyroid nodule: Secondary | ICD-10-CM

## 2020-10-28 ENCOUNTER — Ambulatory Visit (HOSPITAL_COMMUNITY)
Admission: RE | Admit: 2020-10-28 | Discharge: 2020-10-28 | Disposition: A | Discharge status: Home / Self Care | Payer: Medicare HMO | Source: Ambulatory Visit | Attending: Internal Medicine | Admitting: Internal Medicine

## 2020-10-28 DIAGNOSIS — K5790 Diverticulosis of intestine, part unspecified, without perforation or abscess without bleeding: Secondary | ICD-10-CM | POA: Diagnosis not present

## 2020-10-28 DIAGNOSIS — I700 Atherosclerosis of aorta: Secondary | ICD-10-CM | POA: Insufficient documentation

## 2020-10-28 DIAGNOSIS — R911 Solitary pulmonary nodule: Secondary | ICD-10-CM | POA: Insufficient documentation

## 2020-10-28 DIAGNOSIS — M419 Scoliosis, unspecified: Secondary | ICD-10-CM | POA: Diagnosis not present

## 2020-10-28 DIAGNOSIS — J984 Other disorders of lung: Secondary | ICD-10-CM | POA: Diagnosis not present

## 2020-10-28 DIAGNOSIS — I2510 Atherosclerotic heart disease of native coronary artery without angina pectoris: Secondary | ICD-10-CM | POA: Insufficient documentation

## 2020-10-28 LAB — GLUCOSE, CAPILLARY: Glucose-Capillary: 118 mg/dL — ABNORMAL HIGH (ref 70–99)

## 2020-10-28 MED ORDER — FLUDEOXYGLUCOSE F - 18 (FDG) INJECTION
6.1000 | Freq: Once | INTRAVENOUS | Status: AC
  Administered 2020-10-28: 6.1 via INTRAVENOUS

## 2020-11-12 ENCOUNTER — Other Ambulatory Visit: Payer: Self-pay | Admitting: Internal Medicine

## 2020-11-12 DIAGNOSIS — E041 Nontoxic single thyroid nodule: Secondary | ICD-10-CM

## 2020-11-14 ENCOUNTER — Encounter: Payer: Self-pay | Admitting: Pulmonary Disease

## 2020-11-14 ENCOUNTER — Other Ambulatory Visit: Payer: Self-pay

## 2020-11-14 ENCOUNTER — Ambulatory Visit (INDEPENDENT_AMBULATORY_CARE_PROVIDER_SITE_OTHER): Payer: Medicare HMO | Admitting: Pulmonary Disease

## 2020-11-14 VITALS — BP 118/64 | HR 73 | Temp 98.1°F | Ht 63.0 in | Wt 123.2 lb

## 2020-11-14 DIAGNOSIS — R911 Solitary pulmonary nodule: Secondary | ICD-10-CM

## 2020-11-14 DIAGNOSIS — Z7722 Contact with and (suspected) exposure to environmental tobacco smoke (acute) (chronic): Secondary | ICD-10-CM | POA: Diagnosis not present

## 2020-11-14 NOTE — Progress Notes (Signed)
Synopsis: Referred in October 2022 for lung nodule by Rodrigo Ran, MD  Subjective:   PATIENT ID: Donna Callahan GENDER: female DOB: February 22, 1930, MRN: [id number]  Chief Complaint  Patient presents with   Consult    Pt. Wants to talk about the spot on her lower left lobe    This is an 87 year old female, past medical history of scoliosis, CKD, high cholesterol.  Son with lung cancer, another son with pancreatic cancer both are now deceased.  Patient is a lifelong non-smoker, does have secondhand smoke exposure from family members and husband.  Patient had a CT scan of the chest for evaluation and follow-up regarding a thoracic aneurysm.  She usually has this screened annually.  This year the screening showed a left lower lobe pulmonary nodule that was 9 x 14 mm.  Patient had a nuclear medicine PET scan which revealed an SUV of 2.8.  We reviewed the images today as well as the possibilities of infectious versus malignant etiology of lung nodules.  From a respiratory standpoint patient has no significant plaints complaints today.   Past Medical History:  Diagnosis Date   CKD (chronic kidney disease) stage 2, GFR 60-89 ml/min    Hard of hearing    High cholesterol    Hypertension    Iron deficiency anemia    Scoliosis    Varicose veins      Family History  Problem Relation Age of Onset   Hypertension Mother    Bronchiolitis Mother    Pneumonia Mother    Heart attack Father    Pneumonia Sister    Aortic dissection Sister    AAA (abdominal aortic aneurysm) Other        3 OTHER SIBLINGS DIED FOR THIS   Other Other        MOTOR VEHICLE ACC   Cancer - Lung Son        11/2011   Pancreatic cancer Son    Fibromyalgia Daughter    Healthy Grandchild        GREAT GRAND CHILDREN   Heart attack Maternal Grandfather      Past Surgical History:  Procedure Laterality Date   stab phlebectomy Right 04/27/2016   stab phlebectomy right leg by Josephina Gip MD    stab phlebectomy  Left  05/11/2016   stab phlebectomy > 20 incisions left leg by Josephina Gip MD    STAPEDECTOMY     bil ears   TONSILLECTOMY     TOTAL KNEE ARTHROPLASTY Right 05/02/2014   Procedure: RIGHT TOTAL KNEE ARTHROPLASTY;  Surgeon: Ollen Gross, MD;  Location: [facility];  Service: Orthopedics;  Laterality: Right;   VARICOSE VEIN SURGERY      Social History   Socioeconomic History   Marital status: Widowed    Spouse name: Not on file   Number of children: Not on file   Years of education: Not on file   Highest education level: Not on file  Occupational History   Not on file  Tobacco Use   Smoking status: Never   Smokeless tobacco: Never  Vaping Use   Vaping Use: Never used  Substance and Sexual Activity   Alcohol use: No   Drug use: No   Sexual activity: Never    Comment: HUSBAND DIED 2004-05-13  Other Topics Concern   Not on file  Social History Narrative   Not on file   Social Determinants of Health   Financial Resource Strain: Not on file  Food Insecurity: Not on  file  Transportation Needs: Not on file  Physical Activity: Not on file  Stress: Not on file  Social Connections: Not on file  Intimate Partner Violence: Not on file     Allergies  Allergen Reactions   Benadryl [Diphenhydramine Hcl (Sleep)] Other (See Comments)    Paradoxical response, hyperactivity   Diphenhydramine Hcl Other (See Comments)    Paradoxical response, hyperactivity   Penicillins Hives   Quinolones     Patient was warned about not using Cipro and similar antibiotics. Recent studies have raised concern that fluoroquinolone antibiotics could be associated with an increased risk of aortic aneurysm Fluoroquinolones have non-antimicrobial properties that might jeopardise the integrity of the extracellular matrix of the vascular wall In a  propensity score matched cohort study in [place], there was a 66% increased rate of aortic aneurysm or dissection associated with oral fluoroquinolone use, compared wit    Sulfacetamide Sodium Nausea And Vomiting   Penicillin G Rash   Sulfa Antibiotics Nausea And Vomiting and Rash     Outpatient Medications Prior to Visit  Medication Sig Dispense Refill   acetaminophen (TYLENOL) 500 MG tablet Take 500-1,000 mg by mouth as needed for moderate pain.      amLODipine (NORVASC) 5 MG tablet Take 1 tablet (5 mg total) by mouth daily. 90 tablet 3   baclofen (LIORESAL) 10 MG tablet Take 5 mg by mouth as needed for muscle spasms.     busPIRone (BUSPAR) 7.5 MG tablet Take 7.5 mg by mouth as needed. For anxiety     doxazosin (CARDURA) 2 MG tablet Take 4 mg by mouth daily.     estradiol (ESTRACE) 0.1 MG/GM vaginal cream Place 1 Applicatorful vaginally 3 (three) times a week.     loperamide (IMODIUM) 2 MG capsule Take by mouth as needed for diarrhea or loose stools.     Multiple Vitamins-Minerals (CENTRUM SILVER ULTRA WOMENS) TABS Take 1 tablet by mouth daily.     Multiple Vitamins-Minerals (PRESERVISION AREDS 2 PO) Take 1 tablet by mouth 2 (two) times daily.     olmesartan (BENICAR) 40 MG tablet TAKE 1 TABLET BY MOUTH EVERY DAY **TO REPLACE IRBESARTAN**  3   Polyethyl Glycol-Propyl Glycol (SYSTANE OP) Apply 1 drop to eye every morning.     potassium chloride (KLOR-CON) 10 MEQ tablet Take 60 mEq by mouth 3 (three) times daily.     psyllium (METAMUCIL) 58.6 % powder Take 1 packet by mouth at bedtime as needed (for constipation).      sodium chloride (OCEAN) 0.65 % SOLN nasal spray Place 1-2 sprays into both nostrils every morning.     triamterene-hydrochlorothiazide (MAXZIDE-25) 37.5-25 MG tablet Take 1 tablet by mouth daily.      carvedilol (COREG) 12.5 MG tablet TAKE 1 TABLET (12.5 MG TOTAL) BY MOUTH 2 (TWO) TIMES DAILY. 180 tablet 3   No facility-administered medications prior to visit.    Review of Systems  Constitutional:  Negative for chills, fever, malaise/fatigue and weight loss.  HENT:  Negative for hearing loss, sore throat and tinnitus.   Eyes:  Negative for  blurred vision and double vision.  Respiratory:  Negative for cough, hemoptysis, sputum production, shortness of breath, wheezing and stridor.   Cardiovascular:  Negative for chest pain, palpitations, orthopnea, leg swelling and PND.  Gastrointestinal:  Negative for abdominal pain, constipation, diarrhea, heartburn, nausea and vomiting.  Genitourinary:  Negative for dysuria, hematuria and urgency.  Musculoskeletal:  Negative for joint pain and myalgias.  Skin:  Negative for itching and  rash.  Neurological:  Negative for dizziness, tingling, weakness and headaches.  Endo/Heme/Allergies:  Negative for environmental allergies. Does not bruise/bleed easily.  Psychiatric/Behavioral:  Negative for depression. The patient is not nervous/anxious and does not have insomnia.   All other systems reviewed and are negative.   Objective:  Physical Exam Vitals reviewed.  Constitutional:      General: She is not in acute distress.    Appearance: She is well-developed.  HENT:     Head: Normocephalic and atraumatic.  Eyes:     General: No scleral icterus.    Conjunctiva/sclera: Conjunctivae normal.     Pupils: Pupils are equal, round, and reactive to light.  Neck:     Vascular: No JVD.     Trachea: No tracheal deviation.  Cardiovascular:     Rate and Rhythm: Normal rate and regular rhythm.     Heart sounds: Normal heart sounds. No murmur heard. Pulmonary:     Effort: Pulmonary effort is normal. No tachypnea, accessory muscle usage or respiratory distress.     Breath sounds: No stridor. No wheezing, rhonchi or rales.     Comments: Few faint crackles in the base with deep inspiration. Abdominal:     General: Bowel sounds are normal. There is no distension.     Palpations: Abdomen is soft.     Tenderness: There is no abdominal tenderness.  Musculoskeletal:        General: No tenderness.     Cervical back: Neck supple.     Comments: Kyphosis of the spine.  Lymphadenopathy:     Cervical: No  cervical adenopathy.  Skin:    General: Skin is warm and dry.     Capillary Refill: Capillary refill takes less than 2 seconds.     Findings: No rash.  Neurological:     Mental Status: She is alert and oriented to person, place, and time.  Psychiatric:        Behavior: Behavior normal.     Vitals:   11/14/20 1417  BP: 118/64  Pulse: 73  Temp: 98.1 F (36.7 C)  TempSrc: Oral  SpO2: 98%  Weight: 123 lb 3.7 oz (55.9 kg)  Height: 5\' 3"  (1.6 m)   98% on  LPM  RA BMI Readings from Last 3 Encounters:  11/14/20 21.83 kg/m  10/10/20 21.61 kg/m  10/09/20 21.61 kg/m   Wt Readings from Last 3 Encounters:  11/14/20 123 lb 3.7 oz (55.9 kg)  10/10/20 122 lb (55.3 kg)  10/09/20 122 lb (55.3 kg)     CBC    Component Value Date/Time   WBC 9.8 04/30/2020 1509   RBC 4.14 04/30/2020 1509   HGB 12.1 04/30/2020 1509   HCT 35.8 04/30/2020 1509   PLT 268 04/30/2020 1509   MCV 86.5 04/30/2020 1509   MCH 29.2 04/30/2020 1509   MCHC 33.8 04/30/2020 1509   RDW 12.9 04/30/2020 1509   LYMPHSABS 990 04/30/2020 1509   MONOABS 0.4 07/23/2014 1645   EOSABS 10 (L) 04/30/2020 1509   BASOSABS 29 04/30/2020 1509      Chest Imaging: September 2022: CT chest left lower lobe 14 mm pulmonary nodule new in comparison to previous imaging of last year. The patient's images have been independently reviewed by me.      Pulmonary Functions Testing Results: No flowsheet data found.  FeNO:   Pathology:   Echocardiogram:   Heart Catheterization:     Assessment & Plan:     ICD-10-CM   1. Lung nodule  R91.1 CT Super D Chest Wo Contrast    2. Second hand smoke exposure  Z77.22       Discussion:  This is an 87 year old female, left lower lobe pulmonary nodule found incidentally on CT imaging.  This was not present on CT imaging last year.  It is approximately 14 mm in size.  She does have secondhand smoke exposure, lifelong non-smoker herself.  We discussed the risk today in the  office of lower lobe lung nodules in non-smokers for the development of malignancy.  Plan: I think with her age and her willingness to consider conservative follow-up with CT imaging I think that is the next best step.  It does have low-level hypermetabolic uptake on CT scan and it is a new lesion that has not been present on previous CT imaging which does increase his risk for potentially being a malignancy. I explained that if the lesion was still there and her repeat CT scan in January that I think we should potentially consider tissue sampling. Patient is agreeable to this plan. We will have a repeat CT scan scheduled for January 2023. After appointment if the nodule is still there we can discuss robotic assisted bronchoscopy and tissue sampling. RTC in 3 months.   Current Outpatient Medications:    acetaminophen (TYLENOL) 500 MG tablet, Take 500-1,000 mg by mouth as needed for moderate pain. , Disp: , Rfl:    amLODipine (NORVASC) 5 MG tablet, Take 1 tablet (5 mg total) by mouth daily., Disp: 90 tablet, Rfl: 3   baclofen (LIORESAL) 10 MG tablet, Take 5 mg by mouth as needed for muscle spasms., Disp: , Rfl:    busPIRone (BUSPAR) 7.5 MG tablet, Take 7.5 mg by mouth as needed. For anxiety, Disp: , Rfl:    doxazosin (CARDURA) 2 MG tablet, Take 4 mg by mouth daily., Disp: , Rfl:    estradiol (ESTRACE) 0.1 MG/GM vaginal cream, Place 1 Applicatorful vaginally 3 (three) times a week., Disp: , Rfl:    loperamide (IMODIUM) 2 MG capsule, Take by mouth as needed for diarrhea or loose stools., Disp: , Rfl:    Multiple Vitamins-Minerals (CENTRUM SILVER ULTRA WOMENS) TABS, Take 1 tablet by mouth daily., Disp: , Rfl:    Multiple Vitamins-Minerals (PRESERVISION AREDS 2 PO), Take 1 tablet by mouth 2 (two) times daily., Disp: , Rfl:    olmesartan (BENICAR) 40 MG tablet, TAKE 1 TABLET BY MOUTH EVERY DAY **TO REPLACE IRBESARTAN**, Disp: , Rfl: 3   Polyethyl Glycol-Propyl Glycol (SYSTANE OP), Apply 1 drop to eye  every morning., Disp: , Rfl:    potassium chloride (KLOR-CON) 10 MEQ tablet, Take 60 mEq by mouth 3 (three) times daily., Disp: , Rfl:    psyllium (METAMUCIL) 58.6 % powder, Take 1 packet by mouth at bedtime as needed (for constipation). , Disp: , Rfl:    sodium chloride (OCEAN) 0.65 % SOLN nasal spray, Place 1-2 sprays into both nostrils every morning., Disp: , Rfl:    triamterene-hydrochlorothiazide (MAXZIDE-25) 37.5-25 MG tablet, Take 1 tablet by mouth daily. , Disp: , Rfl:    carvedilol (COREG) 12.5 MG tablet, TAKE 1 TABLET (12.5 MG TOTAL) BY MOUTH 2 (TWO) TIMES DAILY., Disp: 180 tablet, Rfl: 3  I spent 47 minutes dedicated to the care of this patient on the date of this encounter to include pre-visit review of records, face-to-face time with the patient discussing conditions above, post visit ordering of testing, clinical documentation with the electronic health record, making appropriate referrals as documented, and  communicating necessary findings to members of the patients care team.    Josephine Igo, DO [facility] 11/14/2020 4:42 PM

## 2020-11-14 NOTE — Patient Instructions (Signed)
Thank you for visiting Dr. Tonia Brooms at [facility]. Today we recommend the following:  Orders Placed This Encounter  Procedures   CT Super D Chest Wo Contrast   Please get get complete prior to appt.   Return in about 3 months (around 02/14/2021) for with Kandice Robinsons, NP, or Dr. Tonia Brooms.    Please do your part to reduce the spread of COVID-19.

## 2020-11-21 ENCOUNTER — Other Ambulatory Visit (HOSPITAL_COMMUNITY)
Admission: RE | Admit: 2020-11-21 | Discharge: 2020-11-21 | Disposition: A | Discharge status: Home / Self Care | Payer: Medicare HMO | Source: Ambulatory Visit | Attending: Internal Medicine | Admitting: Internal Medicine

## 2020-11-21 ENCOUNTER — Telehealth: Payer: Self-pay | Admitting: Orthopaedic Surgery

## 2020-11-21 ENCOUNTER — Other Ambulatory Visit: Payer: Self-pay

## 2020-11-21 ENCOUNTER — Ambulatory Visit
Admission: RE | Admit: 2020-11-21 | Discharge: 2020-11-21 | Disposition: A | Discharge status: Home / Self Care | Payer: Medicare HMO | Source: Ambulatory Visit | Attending: Internal Medicine | Admitting: Internal Medicine

## 2020-11-21 DIAGNOSIS — E041 Nontoxic single thyroid nodule: Secondary | ICD-10-CM

## 2020-11-21 DIAGNOSIS — M25551 Pain in right hip: Secondary | ICD-10-CM

## 2020-11-21 DIAGNOSIS — I96 Gangrene, not elsewhere classified: Secondary | ICD-10-CM | POA: Diagnosis not present

## 2020-11-21 DIAGNOSIS — L089 Local infection of the skin and subcutaneous tissue, unspecified: Secondary | ICD-10-CM | POA: Diagnosis not present

## 2020-11-21 DIAGNOSIS — M86161 Other acute osteomyelitis, right tibia and fibula: Secondary | ICD-10-CM | POA: Diagnosis not present

## 2020-11-21 DIAGNOSIS — L97919 Non-pressure chronic ulcer of unspecified part of right lower leg with unspecified severity: Secondary | ICD-10-CM | POA: Diagnosis not present

## 2020-11-21 NOTE — Telephone Encounter (Signed)
She is coming in to see xu 11/10 for right hip pain. Xu said to go ahead and make appt with [facility]. Did you want to work her in the same day or make appt for another day?   FYI: order already made.

## 2020-11-21 NOTE — Telephone Encounter (Signed)
Patient called asked if she can get a cortisone injection in her right hip when she comes to her appointment 11/28/2020? The number to contact patient is [phone]

## 2020-11-21 NOTE — Telephone Encounter (Signed)
Yes she should make appt with [facility].

## 2020-11-21 NOTE — Telephone Encounter (Signed)
Cancel appt with me.

## 2020-11-21 NOTE — Telephone Encounter (Signed)
I spoke with the patient and she can come for a hip injection on 11/10 at 0945. I am unable to schedule because her chart is locked by [facility]. I will hold appointment for her until her chart is unlocked.

## 2020-11-22 LAB — CYTOLOGY - NON PAP

## 2020-11-22 NOTE — Telephone Encounter (Signed)
Cx Appt

## 2020-11-28 ENCOUNTER — Encounter: Payer: Self-pay | Admitting: Physical Medicine and Rehabilitation

## 2020-11-28 ENCOUNTER — Ambulatory Visit (INDEPENDENT_AMBULATORY_CARE_PROVIDER_SITE_OTHER): Payer: Medicare HMO | Admitting: Physical Medicine and Rehabilitation

## 2020-11-28 ENCOUNTER — Other Ambulatory Visit: Payer: Self-pay

## 2020-11-28 ENCOUNTER — Ambulatory Visit: Payer: Self-pay

## 2020-11-28 ENCOUNTER — Ambulatory Visit: Payer: BC Managed Care – PPO | Admitting: Orthopaedic Surgery

## 2020-11-28 DIAGNOSIS — M25551 Pain in right hip: Secondary | ICD-10-CM

## 2020-11-28 NOTE — Progress Notes (Signed)
   Donna Callahan - 87 y.o. female MRN [id number]  Date of birth: 15-May-1930  Office Visit Note: Visit Date: 11/28/2020 PCP: Rodrigo Ran, MD Referred by: Rodrigo Ran, MD  Subjective: Chief Complaint  Patient presents with   Right Hip - Pain    HPI:  Donna Callahan is a 87 y.o. female who comes in today at the request of Dr. Glee Arvin for planned Right anesthetic hip arthrogram with fluoroscopic guidance.  The patient has failed conservative care including home exercise, medications, time and activity modification.  This injection will be diagnostic and hopefully therapeutic.  Please see requesting physician notes for further details and justification.   ROS Otherwise per HPI.  Assessment & Plan: Visit Diagnoses:    ICD-10-CM   1. Pain in right hip  M25.551 Large Joint Inj: R hip joint    XR C-ARM NO REPORT      Plan: No additional findings.   Meds & Orders: No orders of the defined types were placed in this encounter.   Orders Placed This Encounter  Procedures   Large Joint Inj: R hip joint   XR C-ARM NO REPORT    Follow-up: Return for Glee Arvin, MD as needed.   Procedures: Large Joint Inj: R hip joint on 11/28/2020 9:39 AM Indications: diagnostic evaluation and pain Details: 22 G 3.5 in needle, fluoroscopy-guided anterior approach  Arthrogram: No  Medications: 4 mL bupivacaine 0.25 %; 60 mg triamcinolone acetonide 40 MG/ML Outcome: tolerated well, no immediate complications  There was excellent flow of contrast producing a partial arthrogram of the hip. The patient did have relief of symptoms during the anesthetic phase of the injection. Procedure, treatment alternatives, risks and benefits explained, specific risks discussed. Consent was given by the patient. Immediately prior to procedure a time out was called to verify the correct patient, procedure, equipment, support staff and site/side marked as required. Patient was prepped and draped in the usual sterile  fashion.         Clinical History: No specialty comments available.     Objective:  VS:  HT:    WT:   BMI:     BP:   HR: bpm  TEMP: ( )  RESP:  Physical Exam   Imaging: No results found.

## 2020-11-28 NOTE — Progress Notes (Signed)
Pt state right hip pain. Pt state walking and laying in bed makes the pain worse. Pt state she takes over the counter pain meds to help ease her pain.  Numeric Pain Rating Scale and Functional Assessment Average Pain 5   In the last MONTH (on 0-10 scale) has pain interfered with the following?  1. General activity like being  able to carry out your everyday physical activities such as walking, climbing stairs, carrying groceries, or moving a chair?  Rating(83)    -BT, -Dye Allergies.

## 2020-12-03 DIAGNOSIS — Z743 Need for continuous supervision: Secondary | ICD-10-CM | POA: Diagnosis not present

## 2020-12-03 DIAGNOSIS — R404 Transient alteration of awareness: Secondary | ICD-10-CM | POA: Diagnosis not present

## 2020-12-03 DIAGNOSIS — I499 Cardiac arrhythmia, unspecified: Secondary | ICD-10-CM | POA: Diagnosis not present

## 2020-12-04 MED ORDER — TRIAMCINOLONE ACETONIDE 40 MG/ML IJ SUSP
60.0000 mg | INTRAMUSCULAR | Status: AC | PRN
  Administered 2020-11-28: 60 mg via INTRA_ARTICULAR

## 2020-12-04 MED ORDER — BUPIVACAINE HCL 0.25 % IJ SOLN
4.0000 mL | INTRAMUSCULAR | Status: AC | PRN
  Administered 2020-11-28: 4 mL via INTRA_ARTICULAR

## 2020-12-10 ENCOUNTER — Encounter (HOSPITAL_COMMUNITY): Payer: Self-pay

## 2020-12-19 DIAGNOSIS — 419620001 Death: Secondary | SNOMED CT | POA: Diagnosis not present

## 2020-12-19 DEATH — deceased

## 2021-01-30 ENCOUNTER — Other Ambulatory Visit: Payer: BC Managed Care – PPO

## 2021-06-18 IMAGING — CT CT ANGIO CHEST
2 of 6 series · 13 of 36 positions shown · IV contrast (iopamidol)
Comparison: 03/24/2018, 08/27/2017

CLINICAL DATA: Follow-up [facility]

EXAM:
CT ANGIOGRAPHY CHEST WITH CONTRAST
TECHNIQUE: Multidetector CT imaging of the chest was performed using the
standard protocol during bolus administration of intravenous
contrast. Multiplanar CT image reconstructions and MIPs were
obtained to evaluate the vascular anatomy.
CONTRAST:  75mL [id number] IOPAMIDOL ([id number]) INJECTION 76%

[Series 5: cta thorax 2.00 bv36 s3 axial arterial · axial · arterial · 0.49mm/px · z∈[+1484,+1747]mm · 12 of 160 slices shown]
[im 13/160  lung]
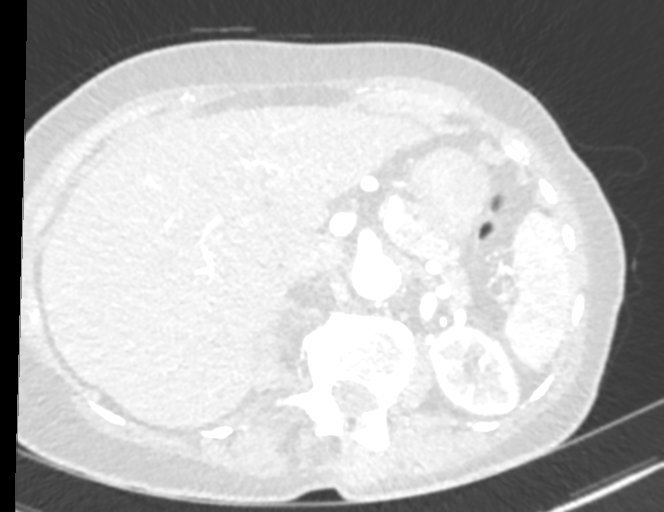
[im 25/160  mediastinal]
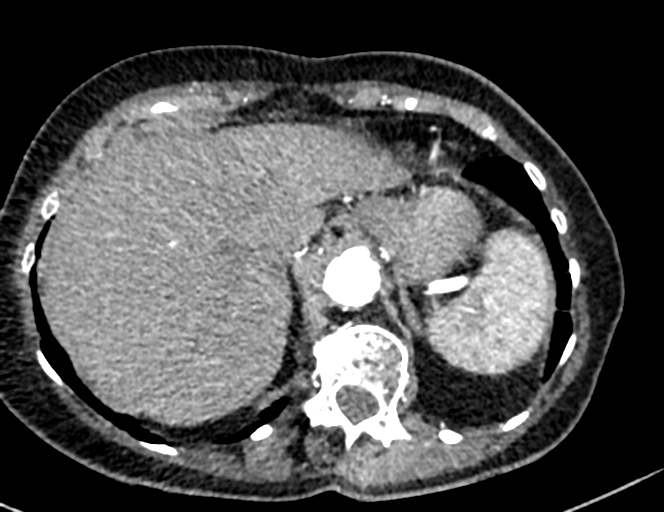
[im 37/160  lung]
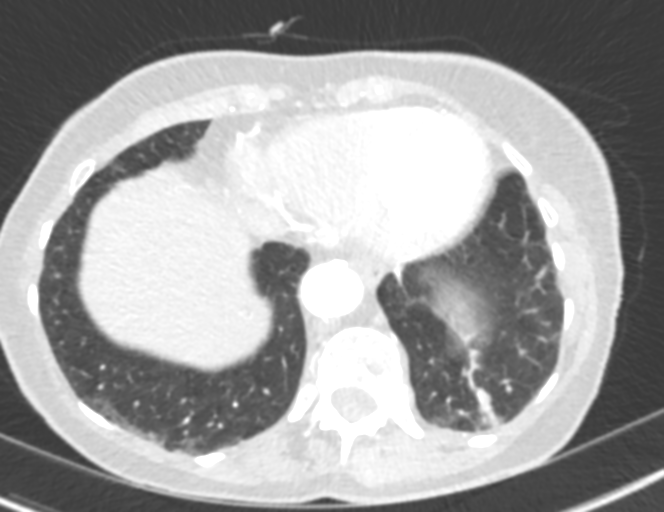
[im 49/160  mediastinal]
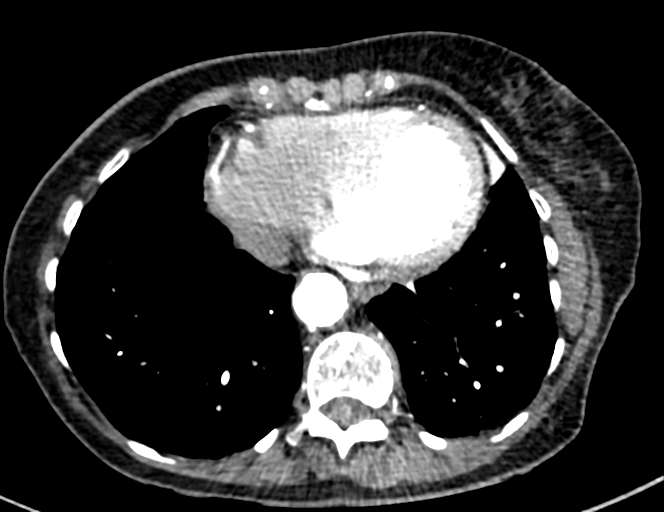
[im 62/160  lung]
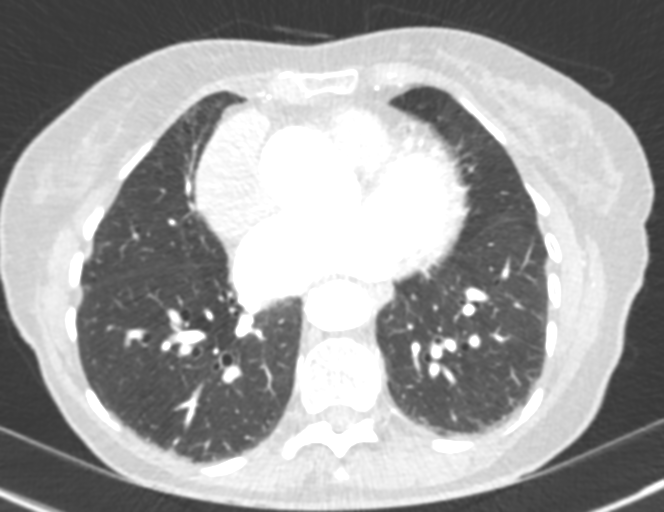
[im 74/160  mediastinal]
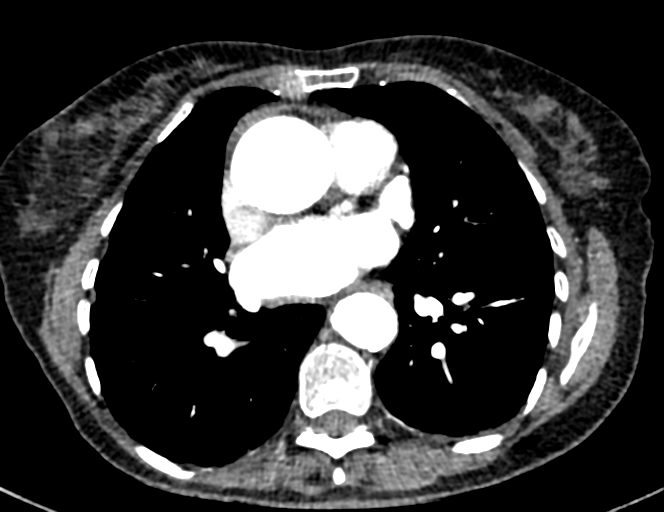
[im 86/160  lung]
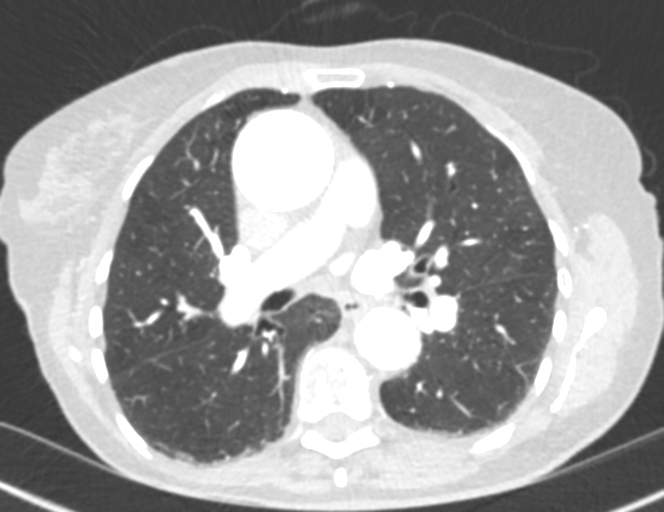
[im 98/160  mediastinal]
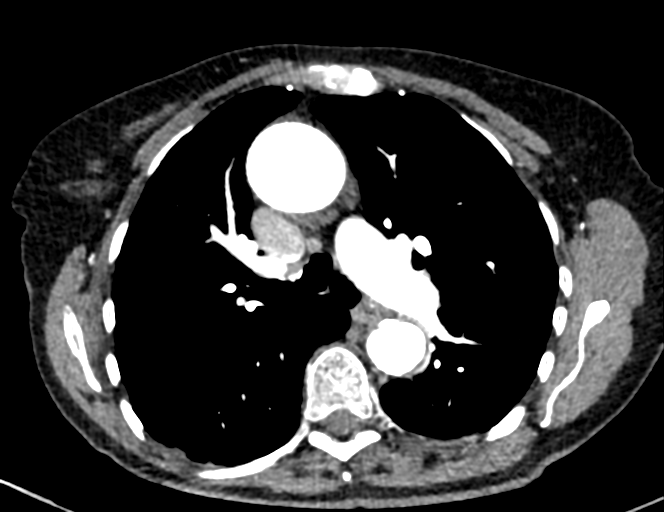
[im 111/160  lung]
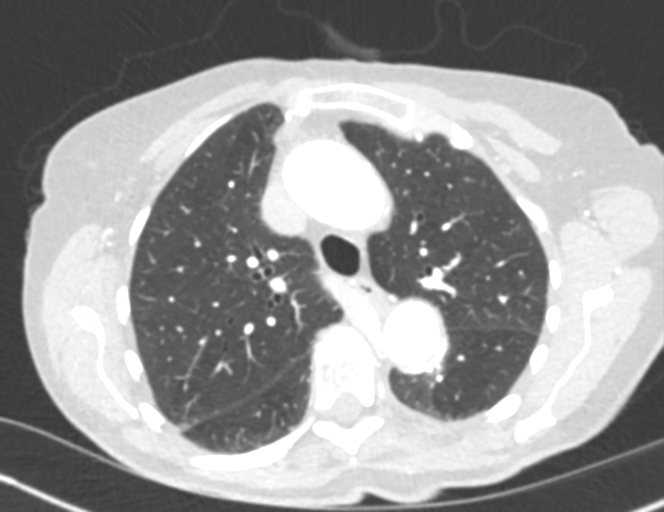
[im 123/160  mediastinal]
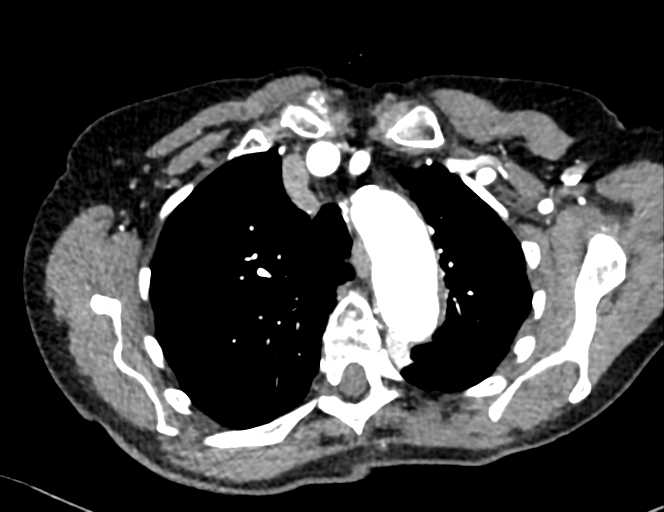
[im 135/160  lung]
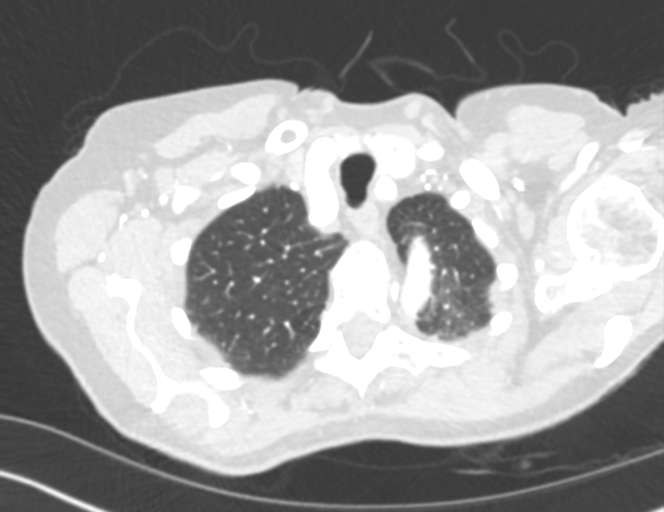
[im 147/160  mediastinal]
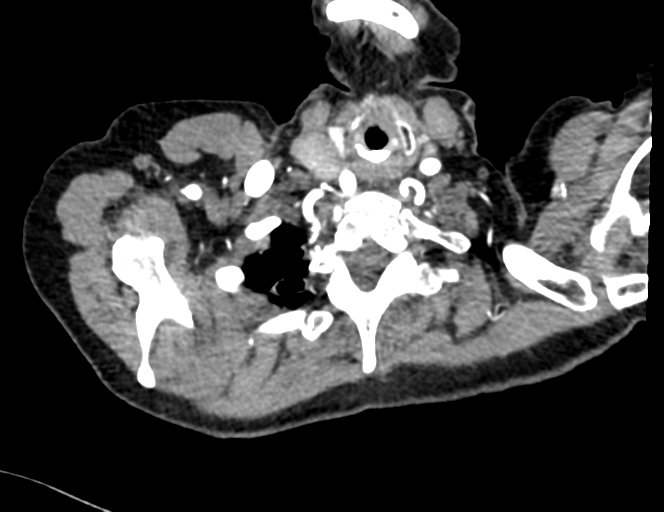

[Series 10: cta thorax 2.00 bv36 s3 cor st · coronal · 0.63mm/px · 1 of 123 slices shown]
[im 62/123  mediastinal]
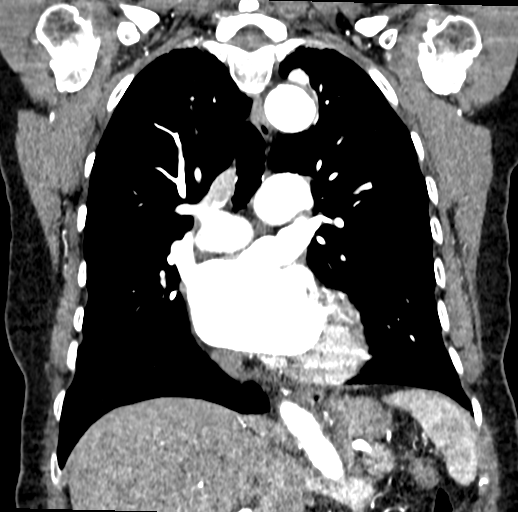

[13 of 36 positions shown; findings below may reference images not displayed]

FINDINGS: Cardiovascular: Preferential opacification of the thoracic aorta.
Unchanged enlargement of the tubular ascending thoracic aorta,
measuring 4.8 x 4.8 cm. The aortic valve measures 2.2 cm and the
sinuses of Valsalva measure 4.1 cm. Normal caliber of the mildly
tortuous descending thoracic aorta. Mixed aortic atherosclerosis.
Normal heart size. Left coronary artery calcifications. No
pericardial effusion.

Mediastinum/Nodes: No enlarged mediastinal, hilar, or axillary lymph
nodes. Calcified 1.2 cm nodule of the left thyroid, unchanged.
Trachea, and esophagus demonstrate no significant findings.

Lungs/Pleura: Bandlike scarring of the dependent left lung. Stable,
benign 6 mm pulmonary nodule of the right middle lobe (series 7,
image 94). No pleural effusion or pneumothorax.

Upper Abdomen: No acute abnormality.

Musculoskeletal: No chest wall abnormality. No acute or significant
osseous findings.

Review of the MIP images confirms the above findings.
IMPRESSION: 1. Unchanged enlargement of the tubular ascending thoracic aorta,
measuring 4.8 x 4.8 cm. The aortic valve measures 2.2 cm and the
sinuses of Valsalva measure 4.1 cm. Normal caliber of the mildly
tortuous descending thoracic aorta.

2. Aortic Atherosclerosis (Y0OM3-ONK.K). Aortic aneurysm NOS
(Y0OM3-AUI.C).

3.  Coronary artery disease.
